# Patient Record
Sex: Female | Born: 1980 | Race: White | Hispanic: No | Smoking: Former smoker
Health system: Southern US, Community
[De-identification: ages and names within clinical notes are randomized; demographics above are authoritative.]

## PROBLEM LIST (undated history)

## (undated) DIAGNOSIS — Z331 Pregnant state, incidental: Secondary | ICD-10-CM

## (undated) DIAGNOSIS — T8859XA Other complications of anesthesia, initial encounter: Secondary | ICD-10-CM

## (undated) DIAGNOSIS — K219 Gastro-esophageal reflux disease without esophagitis: Secondary | ICD-10-CM

## (undated) DIAGNOSIS — T4145XA Adverse effect of unspecified anesthetic, initial encounter: Secondary | ICD-10-CM

## (undated) HISTORY — PX: NASAL SINUS SURGERY: SHX719

---
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
## 2001-12-09 HISTORY — PX: OTHER SURGICAL HISTORY: SHX169

## 2011-09-11 ENCOUNTER — Encounter: Payer: Self-pay | Admitting: *Deleted

## 2011-09-11 ENCOUNTER — Emergency Department (HOSPITAL_COMMUNITY)
Admission: EM | Admit: 2011-09-11 | Discharge: 2011-09-12 | Disposition: A | Discharge status: Home / Self Care | Payer: Self-pay | Attending: Emergency Medicine | Admitting: Emergency Medicine

## 2011-09-11 DIAGNOSIS — K219 Gastro-esophageal reflux disease without esophagitis: Secondary | ICD-10-CM | POA: Insufficient documentation

## 2011-09-11 DIAGNOSIS — Z79899 Other long term (current) drug therapy: Secondary | ICD-10-CM | POA: Insufficient documentation

## 2011-09-11 DIAGNOSIS — R0602 Shortness of breath: Secondary | ICD-10-CM | POA: Insufficient documentation

## 2011-09-11 DIAGNOSIS — F17200 Nicotine dependence, unspecified, uncomplicated: Secondary | ICD-10-CM | POA: Insufficient documentation

## 2011-09-11 DIAGNOSIS — R059 Cough, unspecified: Secondary | ICD-10-CM | POA: Insufficient documentation

## 2011-09-11 DIAGNOSIS — R05 Cough: Secondary | ICD-10-CM | POA: Insufficient documentation

## 2011-09-11 DIAGNOSIS — J069 Acute upper respiratory infection, unspecified: Secondary | ICD-10-CM | POA: Insufficient documentation

## 2011-09-11 DIAGNOSIS — J45901 Unspecified asthma with (acute) exacerbation: Secondary | ICD-10-CM | POA: Insufficient documentation

## 2011-09-11 NOTE — ED Notes (Signed)
Asthma, last neb tx at 2300

## 2011-09-12 MED ORDER — PREDNISONE 20 MG PO TABS
40.0000 mg | ORAL_TABLET | Freq: Once | ORAL | Status: AC
  Administered 2011-09-12: 40 mg via ORAL
  Filled 2011-09-12: qty 2

## 2011-09-12 MED ORDER — PREDNISONE 20 MG PO TABS: 20.0000 mg | ORAL_TABLET | Freq: Every day | ORAL | Status: AC

## 2011-09-12 MED ORDER — ALBUTEROL SULFATE (5 MG/ML) 0.5% IN NEBU
5.0000 mg | INHALATION_SOLUTION | Freq: Once | RESPIRATORY_TRACT | Status: AC
Start: 2011-09-12 — End: 2011-09-12
  Administered 2011-09-12: 5 mg via RESPIRATORY_TRACT
  Filled 2011-09-12: qty 1

## 2011-09-12 MED ORDER — AZITHROMYCIN 250 MG PO TABS: 250.0000 mg | ORAL_TABLET | Freq: Every day | ORAL | Status: AC

## 2011-09-12 MED ORDER — IPRATROPIUM BROMIDE 0.02 % IN SOLN
0.5000 mg | Freq: Once | RESPIRATORY_TRACT | Status: AC
  Administered 2011-09-12: 0.5 mg via RESPIRATORY_TRACT
  Filled 2011-09-12: qty 2.5

## 2011-09-12 MED ORDER — ALBUTEROL SULFATE HFA 108 (90 BASE) MCG/ACT IN AERS: 1.0000 | INHALATION_SPRAY | Freq: Four times a day (QID) | RESPIRATORY_TRACT | Status: DC | PRN

## 2011-09-12 MED ORDER — AZITHROMYCIN 250 MG PO TABS
500.0000 mg | ORAL_TABLET | Freq: Once | ORAL | Status: AC
  Administered 2011-09-12: 500 mg via ORAL
  Filled 2011-09-12: qty 2

## 2011-09-12 NOTE — ED Provider Notes (Signed)
History     CSN: [id number] Arrival date & time: 09/11/2011 11:41 PM  Chief Complaint  Patient presents with  . Asthma    (Consider location/radiation/quality/duration/timing/severity/associated sxs/prior treatment) HPI Comments: Pt with hx of ASThma and GERD - X 2 weeks of gradual onset difficulty breathing, went to [place] several days ago, picked up another cold and felt worsening SOB.  Has had associated wheezing and coughing with productive phlegm.  Used Albuterol neb prior to arrival with some improvement.  Has been on prednisone 2 years ago.  Was hospitalized at that time, no hx of ICu / vent.    Patient is a 30 y.o. female presenting with asthma. The history is provided by the patient.  Asthma Associated symptoms include shortness of breath. Pertinent negatives include no abdominal pain and no headaches.    Past Medical History  Diagnosis Date  . Asthma     Past Surgical History  Procedure Date  . Cholecystectomy   . Nasal sinus surgery     History reviewed. No pertinent family history.  History  Substance Use Topics  . Smoking status: Current Everyday Smoker    Types: Cigarettes  . Smokeless tobacco: Not on file  . Alcohol Use: Yes     occ. use    OB History    Grav Para Term Preterm Abortions TAB SAB Ect Mult Living                  Review of Systems  Constitutional: Negative for fever and chills.  HENT: Positive for congestion and sinus pressure. Negative for neck pain.   Eyes: Negative for pain and visual disturbance.  Respiratory: Positive for cough, chest tightness, shortness of breath and wheezing.   Cardiovascular: Negative for leg swelling.  Gastrointestinal: Negative for nausea, vomiting and abdominal pain.  Genitourinary: Negative for dysuria.  Musculoskeletal: Negative for back pain.  Skin: Negative for rash.  Neurological: Negative for headaches.  Hematological: Negative for adenopathy. Does not bruise/bleed easily.    Allergies  Review of  patient's allergies indicates no known allergies.  Home Medications   Current Outpatient Rx  Name Route Sig Dispense Refill  . ALBUTEROL SULFATE HFA 108 (90 BASE) MCG/ACT IN AERS Inhalation Inhale 2 puffs into the lungs every 6 (six) hours as needed.      Marland Kitchen FLUTICASONE-SALMETEROL 500-50 MCG/DOSE IN AEPB Inhalation Inhale 1 puff into the lungs every 12 (twelve) hours.      . IPRATROPIUM-ALBUTEROL 0.5-2.5 (3) MG/3ML IN SOLN Nebulization Take 3 mLs by nebulization.      Marland Kitchen LEVOCETIRIZINE DIHYDROCHLORIDE 5 MG PO TABS Oral Take 5 mg by mouth every evening.        BP 122/79  Pulse 96  Temp(Src) 97.8 F (36.6 C) (Oral)  Resp 20  Ht 5\' 7"  (1.702 m)  Wt 175 lb (79.379 kg)  BMI 27.41 kg/m2  SpO2 97%  LMP 09/04/2011  Physical Exam  Nursing note and vitals reviewed. Constitutional: She appears well-developed and well-nourished. No distress.  HENT:  Head: Normocephalic and atraumatic.  Right Ear: External ear normal.  Left Ear: External ear normal.  Mouth/Throat: Oropharynx is clear and moist. No oropharyngeal exudate.       TM's clear bilaterally, OP clear, nares clear  Eyes: Conjunctivae and EOM are normal. Pupils are equal, round, and reactive to light. Right eye exhibits no discharge. Left eye exhibits no discharge. No scleral icterus.  Neck: Normal range of motion. Neck supple. No JVD present. No thyromegaly present.  Cardiovascular: Normal  rate, regular rhythm, normal heart sounds and intact distal pulses.  Exam reveals no gallop and no friction rub.   No murmur heard. Pulmonary/Chest: Effort normal. No respiratory distress. She has wheezes. She has no rales.       Speaks in full sentences, has diffuse mild expiratory wheezing bialterlaly.  Abdominal: Soft. Bowel sounds are normal. She exhibits no distension and no mass. There is no tenderness.  Musculoskeletal: Normal range of motion. She exhibits no edema and no tenderness.  Lymphadenopathy:    She has no cervical adenopathy.    Neurological: She is alert. Coordination normal.  Skin: Skin is warm and dry. No rash noted. No erythema.  Psychiatric: She has a normal mood and affect. Her behavior is normal.    ED Course  Procedures (including critical care time)  Labs Reviewed - No data to display No results found.   No diagnosis found.    MDM  Overall pt appears to have likely URI with asthma attack - sat's 97% on ra, no tachycardia, fever or hypotension.  Will give albuterol - atrovent, prednisone, zithromax.  Close f/u.    Pt reevaqluated - on reexam  Has improved wheezing, sat's remain stable - will d/ chome.  Pt aware of indications for f/u.  Vida Roller, MD 09/12/11 [phone]

## 2011-09-12 NOTE — ED Notes (Addendum)
Pt reports history of asthma and reports that change in weather and recent URI has cause increase in asthma symptoms.  Reports she did do nebulizer treatments at home with no relief.  Inspiratory and expiratory wheezes noted. Pt denies fever at this time.

## 2011-11-21 ENCOUNTER — Emergency Department (HOSPITAL_COMMUNITY)
Admission: EM | Admit: 2011-11-21 | Discharge: 2011-11-21 | Disposition: A | Discharge status: Home / Self Care | Payer: Self-pay | Attending: Emergency Medicine | Admitting: Emergency Medicine

## 2011-11-21 ENCOUNTER — Emergency Department (HOSPITAL_COMMUNITY): Payer: Self-pay

## 2011-11-21 ENCOUNTER — Encounter (HOSPITAL_COMMUNITY): Payer: Self-pay | Admitting: Emergency Medicine

## 2011-11-21 DIAGNOSIS — R05 Cough: Secondary | ICD-10-CM | POA: Insufficient documentation

## 2011-11-21 DIAGNOSIS — Z79899 Other long term (current) drug therapy: Secondary | ICD-10-CM | POA: Insufficient documentation

## 2011-11-21 DIAGNOSIS — R0989 Other specified symptoms and signs involving the circulatory and respiratory systems: Secondary | ICD-10-CM | POA: Insufficient documentation

## 2011-11-21 DIAGNOSIS — R0609 Other forms of dyspnea: Secondary | ICD-10-CM | POA: Insufficient documentation

## 2011-11-21 DIAGNOSIS — O9989 Other specified diseases and conditions complicating pregnancy, childbirth and the puerperium: Secondary | ICD-10-CM | POA: Insufficient documentation

## 2011-11-21 DIAGNOSIS — J45909 Unspecified asthma, uncomplicated: Secondary | ICD-10-CM | POA: Insufficient documentation

## 2011-11-21 DIAGNOSIS — R059 Cough, unspecified: Secondary | ICD-10-CM | POA: Insufficient documentation

## 2011-11-21 MED ORDER — ALBUTEROL SULFATE (5 MG/ML) 0.5% IN NEBU
5.0000 mg | INHALATION_SOLUTION | Freq: Once | RESPIRATORY_TRACT | Status: AC
  Administered 2011-11-21: 5 mg via RESPIRATORY_TRACT
  Filled 2011-11-21: qty 1

## 2011-11-21 MED ORDER — IPRATROPIUM BROMIDE 0.02 % IN SOLN
0.5000 mg | Freq: Once | RESPIRATORY_TRACT | Status: AC
  Administered 2011-11-21: 0.5 mg via RESPIRATORY_TRACT
  Filled 2011-11-21: qty 2.5

## 2011-11-21 MED ORDER — IPRATROPIUM-ALBUTEROL 0.5-2.5 (3) MG/3ML IN SOLN: 3.0000 mL | Freq: Two times a day (BID) | RESPIRATORY_TRACT | Status: DC

## 2011-11-21 MED ORDER — PREDNISONE 20 MG PO TABS
60.0000 mg | ORAL_TABLET | Freq: Once | ORAL | Status: AC
  Administered 2011-11-21: 60 mg via ORAL
  Filled 2011-11-21: qty 3

## 2011-11-21 MED ORDER — ALBUTEROL SULFATE (2.5 MG/3ML) 0.083% IN NEBU: 2.5000 mg | INHALATION_SOLUTION | Freq: Four times a day (QID) | RESPIRATORY_TRACT | Status: DC | PRN

## 2011-11-21 MED ORDER — ALBUTEROL SULFATE HFA 108 (90 BASE) MCG/ACT IN AERS: 1.0000 | INHALATION_SPRAY | Freq: Four times a day (QID) | RESPIRATORY_TRACT | Status: DC | PRN

## 2011-11-21 MED ORDER — PREDNISONE 50 MG PO TABS: 50.0000 mg | ORAL_TABLET | Freq: Every day | ORAL | Status: DC

## 2011-11-21 NOTE — ED Notes (Signed)
Pt stable at discharge no distress noted

## 2011-11-21 NOTE — ED Notes (Signed)
Patient c/o cough x 3-4 days; states has exacerbated her asthma. States nebulizer is not working at home.

## 2011-11-21 NOTE — ED Provider Notes (Signed)
History  Scribed for Donnetta Hutching, MD, the patient was seen in room APA05/APA05. This chart was scribed by Candelaria Stagers. The patient's care started at 9:57 PM.    CSN: [id number] Arrival date & time: 11/21/2011  9:36 PM   First MD Initiated Contact with Patient 11/21/11 2141      Chief Complaint  Patient presents with  . Cough  . Asthma     HPI Katrina Mitchell is a 30 y.o. female who presents to the Emergency Department complaining of difficulty breathing associated with asthma complications that started about five to six days.  She is experiencing a cough.  Patient has used nebulizer with short term relief.  She reports that she has ran out of albuterol inhaler as well as nebullizer treatments.  Pt states she is 5 1/[redacted] weeks pregnant and will see her OB on the 28th.       Past Medical History  Diagnosis Date  . Asthma     Past Surgical History  Procedure Date  . Cholecystectomy   . Nasal sinus surgery     No family history on file.  History  Substance Use Topics  . Smoking status: Former Smoker    Types: Cigarettes  . Smokeless tobacco: Not on file  . Alcohol Use: Yes     occ. use    OB History    Grav Para Term Preterm Abortions TAB SAB Ect Mult Living   1               Review of Systems  Allergies  Review of patient's allergies indicates no known allergies.  Home Medications   Current Outpatient Rx  Name Route Sig Dispense Refill  . ALBUTEROL SULFATE HFA 108 (90 BASE) MCG/ACT IN AERS Inhalation Inhale 2 puffs into the lungs every 6 (six) hours as needed.      . ALBUTEROL SULFATE HFA 108 (90 BASE) MCG/ACT IN AERS Inhalation Inhale 1-2 puffs into the lungs every 6 (six) hours as needed for wheezing. 1 Inhaler 0  . FLUTICASONE-SALMETEROL 500-50 MCG/DOSE IN AEPB Inhalation Inhale 1 puff into the lungs every 12 (twelve) hours.      . IPRATROPIUM-ALBUTEROL 0.5-2.5 (3) MG/3ML IN SOLN Nebulization Take 3 mLs by nebulization.      Marland Kitchen LEVOCETIRIZINE  DIHYDROCHLORIDE 5 MG PO TABS Oral Take 5 mg by mouth every evening.        BP 128/70  Pulse 99  Temp(Src) 98.3 F (36.8 C) (Oral)  Resp 18  Ht 5\' 7"  (1.702 m)  Wt 180 lb (81.647 kg)  BMI 28.19 kg/m2  SpO2 99%  LMP 09/04/2011  Physical Exam  Nursing note and vitals reviewed. Constitutional: She is oriented to person, place, and time. She appears well-developed and well-nourished. No distress.  HENT:  Head: Normocephalic and atraumatic.  Eyes: EOM are normal. Pupils are equal, round, and reactive to light.  Neck: Neck supple. No tracheal deviation present.  Cardiovascular: Normal rate.   Pulmonary/Chest: Effort normal. She has wheezes (bilateral expiratory wheezing).  Abdominal: Soft. She exhibits no distension.  Musculoskeletal: Normal range of motion. She exhibits no edema.  Neurological: She is alert and oriented to person, place, and time. No sensory deficit.  Skin: Skin is warm and dry.  Psychiatric: She has a normal mood and affect. Her behavior is normal.    ED Course  Procedures   Breathing treatment; start on prednisone; refill medications.   DIAGNOSTIC STUDIES: Oxygen Saturation is 99% on room air, normal by my interpretation.  COORDINATION OF CARE:  Ordered:  albuterol (PROVENTIL) (5 MG/ML) 0.5% nebulizer solution 5 mg ; ipratropium (ATROVENT) nebulizer solution 0.5 mg    Labs Reviewed - No data to display No results found.   No diagnosis found.    MDM  Patient has long-standing asthma.  Will avoid chest x-ray secondary to pregnancy. Refill medications. Prednisone prescribed. Benefits exceed risk.    I personally performed the services described in this documentation, which was scribed in my presence. The recorded information has been reviewed and considered.       Donnetta Hutching, MD 11/21/11 [phone]

## 2011-11-27 ENCOUNTER — Encounter (HOSPITAL_COMMUNITY): Payer: Self-pay | Admitting: *Deleted

## 2011-11-27 ENCOUNTER — Emergency Department (HOSPITAL_COMMUNITY)
Admission: EM | Admit: 2011-11-27 | Discharge: 2011-11-27 | Disposition: A | Discharge status: Home / Self Care | Payer: Medicaid Other | Attending: Emergency Medicine | Admitting: Emergency Medicine

## 2011-11-27 DIAGNOSIS — J40 Bronchitis, not specified as acute or chronic: Secondary | ICD-10-CM

## 2011-11-27 DIAGNOSIS — R05 Cough: Secondary | ICD-10-CM | POA: Insufficient documentation

## 2011-11-27 DIAGNOSIS — J45901 Unspecified asthma with (acute) exacerbation: Secondary | ICD-10-CM | POA: Insufficient documentation

## 2011-11-27 DIAGNOSIS — O99891 Other specified diseases and conditions complicating pregnancy: Secondary | ICD-10-CM | POA: Insufficient documentation

## 2011-11-27 DIAGNOSIS — R0602 Shortness of breath: Secondary | ICD-10-CM | POA: Insufficient documentation

## 2011-11-27 DIAGNOSIS — J029 Acute pharyngitis, unspecified: Secondary | ICD-10-CM | POA: Insufficient documentation

## 2011-11-27 DIAGNOSIS — R059 Cough, unspecified: Secondary | ICD-10-CM | POA: Insufficient documentation

## 2011-11-27 DIAGNOSIS — Z331 Pregnant state, incidental: Secondary | ICD-10-CM

## 2011-11-27 HISTORY — DX: Pregnant state, incidental: Z33.1

## 2011-11-27 MED ORDER — ALBUTEROL SULFATE (5 MG/ML) 0.5% IN NEBU
5.0000 mg | INHALATION_SOLUTION | Freq: Once | RESPIRATORY_TRACT | Status: AC
  Administered 2011-11-27: 5 mg via RESPIRATORY_TRACT
  Filled 2011-11-27: qty 1

## 2011-11-27 MED ORDER — AZITHROMYCIN 250 MG PO TABS
500.0000 mg | ORAL_TABLET | Freq: Once | ORAL | Status: AC
  Administered 2011-11-27: 500 mg via ORAL
  Filled 2011-11-27: qty 2

## 2011-11-27 MED ORDER — AZITHROMYCIN 250 MG PO TABS: ORAL_TABLET | ORAL | Status: DC

## 2011-11-27 MED ORDER — DEXAMETHASONE SODIUM PHOSPHATE 4 MG/ML IJ SOLN
10.0000 mg | Freq: Once | INTRAMUSCULAR | Status: AC
  Administered 2011-11-27: 10 mg via INTRAMUSCULAR
  Filled 2011-11-27: qty 3

## 2011-11-27 MED ORDER — CEFTRIAXONE SODIUM 1 G IJ SOLR
1.0000 g | Freq: Once | INTRAMUSCULAR | Status: AC
  Administered 2011-11-27: 1 g via INTRAMUSCULAR
  Filled 2011-11-27: qty 10

## 2011-11-27 MED ORDER — IPRATROPIUM BROMIDE 0.02 % IN SOLN
0.5000 mg | Freq: Once | RESPIRATORY_TRACT | Status: AC
  Administered 2011-11-27: 0.5 mg via RESPIRATORY_TRACT
  Filled 2011-11-27: qty 2.5

## 2011-11-27 NOTE — ED Notes (Signed)
Pt states flare up of asthma x 2 weeks. Seen her and put on steroid, became better for a couple of days but it worse again. Productive cough, yellowish-green in color. NAD at triage.

## 2011-11-27 NOTE — ED Provider Notes (Signed)
History     CSN: [id number] Arrival date & time: 11/27/2011  6:24 PM   First MD Initiated Contact with Patient 11/27/11 1838      Chief Complaint  Patient presents with  . Cough    (Consider location/radiation/quality/duration/timing/severity/associated sxs/prior treatment) HPI Comments: Pt is about [redacted] weeks pregnant.  Initial prenatal appt on 12-06-11.  Patient is a 30 y.o. female presenting with cough. The history is provided by the patient. No language interpreter was used.  Cough This is a new problem. The current episode started more than 1 week ago. The problem occurs constantly. The problem has been gradually worsening. The cough is productive of sputum. Associated symptoms include sore throat and wheezing. She is not a smoker.    Past Medical History  Diagnosis Date  . Asthma   . Pregnancy as incidental finding     Past Surgical History  Procedure Date  . Cholecystectomy   . Nasal sinus surgery     No family history on file.  History  Substance Use Topics  . Smoking status: Former Smoker    Types: Cigarettes  . Smokeless tobacco: Not on file  . Alcohol Use: Yes     occ. use    OB History    Grav Para Term Preterm Abortions TAB SAB Ect Mult Living   1               Review of Systems  Constitutional: Negative for fever.  HENT: Positive for sore throat.   Respiratory: Positive for cough and wheezing.   All other systems reviewed and are negative.    Allergies  Review of patient's allergies indicates no known allergies.  Home Medications   Current Outpatient Rx  Name Route Sig Dispense Refill  . ALBUTEROL SULFATE HFA 108 (90 BASE) MCG/ACT IN AERS Inhalation Inhale 1-2 puffs into the lungs every 6 (six) hours as needed for wheezing. 1 Inhaler 0  . ALBUTEROL SULFATE (2.5 MG/3ML) 0.083% IN NEBU Nebulization Take 3 mLs (2.5 mg total) by nebulization every 6 (six) hours as needed for wheezing. 75 mL 12  . FLUTICASONE-SALMETEROL 500-50 MCG/DOSE IN AEPB  Inhalation Inhale 1 puff into the lungs every 12 (twelve) hours.      . IPRATROPIUM-ALBUTEROL 0.5-2.5 (3) MG/3ML IN SOLN Nebulization Take 3 mLs by nebulization 2 (two) times daily. 75 mL 0  . PREDNISONE 50 MG PO TABS Oral Take 50 mg by mouth daily.      Marland Kitchen PRENATAL MULTIVITAMIN CH Oral Take 1 tablet by mouth at bedtime.        BP 128/70  Pulse 102  Temp(Src) 98.7 F (37.1 C) (Oral)  Resp 18  Ht 5\' 7"  (1.702 m)  Wt 180 lb (81.647 kg)  BMI 28.19 kg/m2  SpO2 100%  LMP 09/04/2011  Physical Exam  Nursing note and vitals reviewed. Constitutional: She is oriented to person, place, and time. She appears well-developed and well-nourished. No distress.  HENT:  Head: Normocephalic and atraumatic.  Eyes: EOM are normal.  Neck: Normal range of motion.  Cardiovascular: Normal rate, regular rhythm and normal heart sounds.   Pulmonary/Chest: Effort normal. No accessory muscle usage. Not tachypneic. No respiratory distress. She has no decreased breath sounds. She has wheezes in the right upper field, the right middle field, the right lower field, the left upper field, the left middle field and the left lower field. She has no rhonchi. She has no rales.  Abdominal: Soft. She exhibits no distension. There is no tenderness.  Musculoskeletal: Normal range of motion.  Neurological: She is alert and oriented to person, place, and time.  Skin: Skin is warm and dry.  Psychiatric: She has a normal mood and affect. Judgment normal.    ED Course  Procedures (including critical care time)  Labs Reviewed - No data to display No results found.   No diagnosis found.    MDM          Worthy Rancher, PA 11/27/11 2032

## 2011-11-27 NOTE — ED Notes (Signed)
HHN in progress 

## 2011-11-27 NOTE — ED Notes (Signed)
Cough,,seen here for same. Says she is taking nebs at home.  ,  But feeling worse.

## 2011-11-29 NOTE — ED Provider Notes (Signed)
Medical screening examination/treatment/procedure(s) were performed by non-physician practitioner and as supervising physician I was immediately available for consultation/collaboration.  Nicoletta Dress. Colon Branch, MD 11/29/11 [phone]

## 2011-12-06 LAB — OB RESULTS CONSOLE HIV ANTIBODY (ROUTINE TESTING): HIV: NONREACTIVE

## 2011-12-06 LAB — OB RESULTS CONSOLE RPR: RPR: NONREACTIVE

## 2011-12-06 LAB — OB RESULTS CONSOLE GC/CHLAMYDIA
Chlamydia: NEGATIVE
Gonorrhea: NEGATIVE

## 2011-12-10 NOTE — L&D Delivery Note (Signed)
Delivery Note  Damyiah is a G3P2002 at 35 weeks and 3 days who came in yesterday with spontaneous rupture of membranes.  She was induced with Pitocin.  At 4:03 AM a viable and healthy female was delivered via Vaginal, Spontaneous Delivery (Presentation: Right Occiput Anterior).  APGAR: 8, 9; weight 5 lb 12.4 oz (2620 g).   Placenta status: Intact, Spontaneous. 2 hard, peripheral calcifications. Sent for pathology.  Cord: 3 vessels.  Anesthesia: Epidural  Episiotomy: None Lacerations: R periurethral abrasion, L vaginal wall abrasion; good hemostasis Suture Repair: None Est. Blood Loss (mL): 350  Mom to postpartum.  Baby to nursery-stable.  Delivery was attended by Sid Falcon, CNM and Dr. Katrinka Blazing and RT of NICU    Junious Silk S 06/29/2012, 4:35 AM   I was present for delivery and agree with note above. [facility]

## 2012-01-13 ENCOUNTER — Other Ambulatory Visit: Payer: Self-pay | Admitting: Obstetrics and Gynecology

## 2012-02-25 ENCOUNTER — Other Ambulatory Visit: Payer: Self-pay | Admitting: Obstetrics & Gynecology

## 2012-03-17 ENCOUNTER — Other Ambulatory Visit: Payer: Self-pay | Admitting: Obstetrics and Gynecology

## 2012-06-28 ENCOUNTER — Inpatient Hospital Stay (HOSPITAL_COMMUNITY)
Admission: AD | Admit: 2012-06-28 | Discharge: 2012-07-01 | DRG: 775 | Disposition: A | Discharge status: Home / Self Care | Payer: Medicaid Other | Source: Ambulatory Visit | Attending: Obstetrics and Gynecology | Admitting: Obstetrics and Gynecology

## 2012-06-28 ENCOUNTER — Encounter (HOSPITAL_COMMUNITY): Payer: Self-pay | Admitting: *Deleted

## 2012-06-28 DIAGNOSIS — O4290 Premature rupture of membranes, unspecified as to length of time between rupture and onset of labor, unspecified weeks of gestation: Principal | ICD-10-CM | POA: Diagnosis present

## 2012-06-28 HISTORY — DX: Other complications of anesthesia, initial encounter: T88.59XA

## 2012-06-28 HISTORY — DX: Adverse effect of unspecified anesthetic, initial encounter: T41.45XA

## 2012-06-28 LAB — CBC
Hemoglobin: 11.6 g/dL — ABNORMAL LOW (ref 12.0–15.0)
MCH: 27.8 pg (ref 26.0–34.0)
MCV: 81.5 fL (ref 78.0–100.0)
Platelets: 209 10*3/uL (ref 150–400)
RBC: 4.17 MIL/uL (ref 3.87–5.11)
WBC: 9.7 10*3/uL (ref 4.0–10.5)

## 2012-06-28 MED ORDER — OXYTOCIN 40 UNITS IN LACTATED RINGERS INFUSION - SIMPLE MED
62.5000 mL/h | Freq: Once | INTRAVENOUS | Status: DC
  Filled 2012-06-28: qty 1000

## 2012-06-28 MED ORDER — LIDOCAINE HCL (PF) 1 % IJ SOLN
30.0000 mL | INTRAMUSCULAR | Status: DC | PRN
  Filled 2012-06-28: qty 30

## 2012-06-28 MED ORDER — PENICILLIN G POTASSIUM 5000000 UNITS IJ SOLR
5.0000 10*6.[IU] | Freq: Once | INTRAVENOUS | Status: AC
  Administered 2012-06-28: 5 10*6.[IU] via INTRAVENOUS
  Filled 2012-06-28: qty 5

## 2012-06-28 MED ORDER — CITRIC ACID-SODIUM CITRATE 334-500 MG/5ML PO SOLN: 30.0000 mL | ORAL | Status: DC | PRN

## 2012-06-28 MED ORDER — ONDANSETRON HCL 4 MG/2ML IJ SOLN: 4.0000 mg | Freq: Four times a day (QID) | INTRAMUSCULAR | Status: DC | PRN

## 2012-06-28 MED ORDER — OXYCODONE-ACETAMINOPHEN 5-325 MG PO TABS: 1.0000 | ORAL_TABLET | ORAL | Status: DC | PRN

## 2012-06-28 MED ORDER — ALBUTEROL SULFATE HFA 108 (90 BASE) MCG/ACT IN AERS
2.0000 | INHALATION_SPRAY | RESPIRATORY_TRACT | Status: DC | PRN
  Filled 2012-06-28: qty 6.7

## 2012-06-28 MED ORDER — PENICILLIN G POTASSIUM 5000000 UNITS IJ SOLR
2.5000 10*6.[IU] | INTRAVENOUS | Status: DC
  Administered 2012-06-28 – 2012-06-29 (×2): 2.5 10*6.[IU] via INTRAVENOUS
  Filled 2012-06-28 (×7): qty 2.5

## 2012-06-28 MED ORDER — IBUPROFEN 600 MG PO TABS: 600.0000 mg | ORAL_TABLET | Freq: Four times a day (QID) | ORAL | Status: DC | PRN

## 2012-06-28 MED ORDER — FLEET ENEMA 7-19 GM/118ML RE ENEM: 1.0000 | ENEMA | RECTAL | Status: DC | PRN

## 2012-06-28 MED ORDER — ACETAMINOPHEN 325 MG PO TABS: 650.0000 mg | ORAL_TABLET | ORAL | Status: DC | PRN

## 2012-06-28 MED ORDER — LACTATED RINGERS IV SOLN
500.0000 mL | INTRAVENOUS | Status: DC | PRN
  Administered 2012-06-29: 500 mL via INTRAVENOUS

## 2012-06-28 MED ORDER — LACTATED RINGERS IV SOLN
INTRAVENOUS | Status: DC
  Administered 2012-06-28 – 2012-06-29 (×3): via INTRAVENOUS

## 2012-06-28 MED ORDER — OXYTOCIN BOLUS FROM INFUSION
250.0000 mL | Freq: Once | INTRAVENOUS | Status: AC
  Administered 2012-06-29: 250 mL via INTRAVENOUS
  Filled 2012-06-28: qty 500

## 2012-06-28 NOTE — H&P (Signed)
I agree with above plans for care. GBS unknown, on PCN prophylaxis

## 2012-06-28 NOTE — H&P (Signed)
Katrina Mitchell is a 31 y.o. female presenting for Preterm Premature Rupture of Membranes @ 05:30;  Pt at [facility]. Maternal Medical History:  Reason for admission: Reason for admission: rupture of membranes.  Contractions: Onset was 3-5 hours ago.   Frequency: irregular.   Perceived severity is mild.    Fetal activity: Perceived fetal activity is normal.   Last perceived fetal movement was within the past hour.    Prenatal complications: PPROM    OB History    Grav Para Term Preterm Abortions TAB SAB Ect Mult Living   3 2 2  0 0 0 0 0 0 2     Past Medical History  Diagnosis Date  . Asthma   . Pregnancy as incidental finding   . Complication of anesthesia    Past Surgical History  Procedure Date  . Cholecystectomy   . Nasal sinus surgery    Family History: family history is not on file. Social History:  reports that she has quit smoking. Her smoking use included Cigarettes. She has never used smokeless tobacco. She reports that she drinks alcohol. She reports that she uses illicit drugs (Marijuana).   Prenatal Transfer Tool  Maternal Diabetes: No Genetic Screening: Normal Maternal Ultrasounds/Referrals: Abnormal:  Findings:   Isolated EIF (echogenic intracardiac focus) Fetal Ultrasounds or other Referrals:  None Maternal Substance Abuse:  Yes:  Type: Marijuana Significant Maternal Medications:  Meds include: Other: see prenatal record Significant Maternal Lab Results:  Lab values include: Other: see prenatal record Other Comments:  HSV II; on valtrex  Review of Systems  Gastrointestinal: Positive for abdominal pain (cramping).  All other systems reviewed and are negative.    Dilation: 1.5 Effacement (%): 50 Station: -1;0 Exam by:: J. Rasch RN  Blood pressure 134/67, pulse 86, temperature 98.4 F (36.9 C), temperature source Oral, resp. rate 18, height 5\' 6"  (1.676 m), weight 92.534 kg (204 lb), last menstrual period 10/25/2011. Maternal Exam:  Uterine  Assessment: Contraction strength is mild.  Contraction frequency is irregular.   Abdomen: Estimated fetal weight is 4.5-5lbs.   Fetal presentation: vertex  Introitus: Vulva is positive for vulvar varicosities. Vulva is negative for lesion.  Vagina is positive for vaginal discharge (clear amniontic fluid ).  Vagina is negative for ulcerations.  Amniotic fluid character: clear.  Pelvis: adequate for delivery.      Fetal Exam Fetal Monitor Review: Baseline rate: 130's.  Variability: marked (>25 bpm).   Pattern: accelerations present and no decelerations.    Fetal State Assessment: Category I - tracings are normal.     Physical Exam  Constitutional: She is oriented to person, place, and time. She appears well-developed and well-nourished. No distress.  HENT:  Head: Normocephalic.  Neck: Normal range of motion. Neck supple.  Cardiovascular: Normal rate, regular rhythm and normal heart sounds.   Respiratory: Effort normal and breath sounds normal.  GI: Soft. There is no tenderness.  Genitourinary: Vulva exhibits no lesion. No bleeding around the vagina. Vaginal discharge (clear amniontic fluid ) found.  Neurological: She is alert and oriented to person, place, and time.  Skin: Skin is warm and dry.    Prenatal labs: ABO, Rh: O/Positive/-- (12/28 0000) Antibody: Negative (12/28 0000) Rubella: Immune (12/28 0000) RPR: Nonreactive (12/28 0000)  HBsAg: Negative (12/28 0000)  HIV: Non-reactive (12/28 0000)  GBS:     Assessment/Plan: Preterm Premature Rupture of Membranes GBS unknown  Plan: Admit to [facility] Foley bulb placed Begin Penicillin prophylaxis Anticipate NSVD    [facility]  06/28/2012, 11:22 AM

## 2012-06-28 NOTE — Progress Notes (Signed)
   Subjective: Pt reports increased pain with contractions.  Objective: BP 113/58  Pulse 91  Temp 98.4 F (36.9 C) (Oral)  Resp 18  Ht 5\' 6"  (1.676 m)  Wt 92.534 kg (204 lb)  BMI 32.93 kg/m2  LMP 10/25/2011      FHT:  FHR: 130's bpm, variability: moderate,  accelerations:  Present,  decelerations:  Absent UC:   irregular, every 2-5 minutes SVE:   Dilation: 1.5 Effacement (%): 50 Station: -2 Exam by:: [employer], rn Foley bulb remains in place with tugging  Labs: Lab Results  Component Value Date   WBC 9.7 06/28/2012   HGB 11.6* 06/28/2012   HCT 34.0* 06/28/2012   MCV 81.5 06/28/2012   PLT 209 06/28/2012    Assessment / Plan: Augmentation of Labor - PPROM GBS Unknown  Labor: Augmentation of Labor - PPROM Preeclampsia:  n/a Fetal Wellbeing:  Category I Pain Control:  Labor support without medications I/D:  n/a Anticipated MOD:  NSVD  [facility] 06/28/2012, 3:24 PM

## 2012-06-29 ENCOUNTER — Encounter (HOSPITAL_COMMUNITY): Payer: Self-pay | Admitting: Anesthesiology

## 2012-06-29 ENCOUNTER — Inpatient Hospital Stay (HOSPITAL_COMMUNITY): Payer: Medicaid Other | Admitting: Anesthesiology

## 2012-06-29 ENCOUNTER — Encounter (HOSPITAL_COMMUNITY): Payer: Self-pay | Admitting: Student

## 2012-06-29 DIAGNOSIS — O4290 Premature rupture of membranes, unspecified as to length of time between rupture and onset of labor, unspecified weeks of gestation: Secondary | ICD-10-CM

## 2012-06-29 MED ORDER — ZOLPIDEM TARTRATE 5 MG PO TABS: 5.0000 mg | ORAL_TABLET | Freq: Every evening | ORAL | Status: DC | PRN

## 2012-06-29 MED ORDER — LANOLIN HYDROUS EX OINT: TOPICAL_OINTMENT | CUTANEOUS | Status: DC | PRN

## 2012-06-29 MED ORDER — PRENATAL MULTIVITAMIN CH
1.0000 | ORAL_TABLET | Freq: Every day | ORAL | Status: DC
  Administered 2012-06-29 – 2012-07-01 (×3): 1 via ORAL
  Filled 2012-06-29 (×4): qty 1

## 2012-06-29 MED ORDER — ONDANSETRON HCL 4 MG/2ML IJ SOLN: 4.0000 mg | INTRAMUSCULAR | Status: DC | PRN

## 2012-06-29 MED ORDER — LACTATED RINGERS IV SOLN
500.0000 mL | Freq: Once | INTRAVENOUS | Status: AC
  Administered 2012-06-29: 500 mL via INTRAVENOUS

## 2012-06-29 MED ORDER — PHENYLEPHRINE 40 MCG/ML (10ML) SYRINGE FOR IV PUSH (FOR BLOOD PRESSURE SUPPORT)
80.0000 ug | PREFILLED_SYRINGE | INTRAVENOUS | Status: DC | PRN
  Filled 2012-06-29: qty 5

## 2012-06-29 MED ORDER — LIDOCAINE HCL (PF) 1 % IJ SOLN
INTRAMUSCULAR | Status: DC | PRN
  Administered 2012-06-29 (×2): 4 mL

## 2012-06-29 MED ORDER — PANTOPRAZOLE SODIUM 20 MG PO TBEC
20.0000 mg | DELAYED_RELEASE_TABLET | Freq: Every day | ORAL | Status: DC | PRN
  Administered 2012-07-01: 20 mg via ORAL
  Filled 2012-06-29: qty 1

## 2012-06-29 MED ORDER — FENTANYL 2.5 MCG/ML BUPIVACAINE 1/10 % EPIDURAL INFUSION (WH - ANES)
14.0000 mL/h | INTRAMUSCULAR | Status: DC
  Filled 2012-06-29: qty 60

## 2012-06-29 MED ORDER — DIBUCAINE 1 % RE OINT: 1.0000 "application " | TOPICAL_OINTMENT | RECTAL | Status: DC | PRN

## 2012-06-29 MED ORDER — OXYTOCIN 40 UNITS IN LACTATED RINGERS INFUSION - SIMPLE MED: 1.0000 m[IU]/min | INTRAVENOUS | Status: DC

## 2012-06-29 MED ORDER — ALBUTEROL SULFATE HFA 108 (90 BASE) MCG/ACT IN AERS
1.0000 | INHALATION_SPRAY | Freq: Four times a day (QID) | RESPIRATORY_TRACT | Status: DC | PRN
  Administered 2012-07-01: 2 via RESPIRATORY_TRACT
  Filled 2012-06-29: qty 6.7

## 2012-06-29 MED ORDER — ONDANSETRON HCL 4 MG PO TABS: 4.0000 mg | ORAL_TABLET | ORAL | Status: DC | PRN

## 2012-06-29 MED ORDER — SENNOSIDES-DOCUSATE SODIUM 8.6-50 MG PO TABS: 2.0000 | ORAL_TABLET | Freq: Every day | ORAL | Status: DC

## 2012-06-29 MED ORDER — DIPHENHYDRAMINE HCL 25 MG PO CAPS: 25.0000 mg | ORAL_CAPSULE | Freq: Four times a day (QID) | ORAL | Status: DC | PRN

## 2012-06-29 MED ORDER — PHENYLEPHRINE 40 MCG/ML (10ML) SYRINGE FOR IV PUSH (FOR BLOOD PRESSURE SUPPORT): 80.0000 ug | PREFILLED_SYRINGE | INTRAVENOUS | Status: DC | PRN

## 2012-06-29 MED ORDER — DIPHENHYDRAMINE HCL 50 MG/ML IJ SOLN: 12.5000 mg | INTRAMUSCULAR | Status: DC | PRN

## 2012-06-29 MED ORDER — TETANUS-DIPHTH-ACELL PERTUSSIS 5-2.5-18.5 LF-MCG/0.5 IM SUSP
0.5000 mL | Freq: Once | INTRAMUSCULAR | Status: AC
  Administered 2012-06-30: 0.5 mL via INTRAMUSCULAR

## 2012-06-29 MED ORDER — OXYCODONE-ACETAMINOPHEN 5-325 MG PO TABS: 1.0000 | ORAL_TABLET | ORAL | Status: DC | PRN

## 2012-06-29 MED ORDER — IBUPROFEN 600 MG PO TABS
600.0000 mg | ORAL_TABLET | Freq: Four times a day (QID) | ORAL | Status: DC
  Administered 2012-06-29 – 2012-07-01 (×10): 600 mg via ORAL
  Filled 2012-06-29 (×10): qty 1

## 2012-06-29 MED ORDER — FENTANYL 2.5 MCG/ML BUPIVACAINE 1/10 % EPIDURAL INFUSION (WH - ANES)
INTRAMUSCULAR | Status: DC | PRN
  Administered 2012-06-29: 14 mL/h via EPIDURAL

## 2012-06-29 MED ORDER — LORATADINE 10 MG PO TABS
10.0000 mg | ORAL_TABLET | Freq: Every day | ORAL | Status: DC
Start: 2012-06-29 — End: 2012-07-01
  Administered 2012-06-29 – 2012-07-01 (×3): 10 mg via ORAL
  Filled 2012-06-29 (×4): qty 1

## 2012-06-29 MED ORDER — BENZOCAINE-MENTHOL 20-0.5 % EX AERO
1.0000 "application " | INHALATION_SPRAY | CUTANEOUS | Status: DC | PRN
  Administered 2012-06-29: 1 via TOPICAL
  Filled 2012-06-29: qty 56

## 2012-06-29 MED ORDER — TERBUTALINE SULFATE 1 MG/ML IJ SOLN: 0.2500 mg | Freq: Once | INTRAMUSCULAR | Status: DC | PRN

## 2012-06-29 MED ORDER — EPHEDRINE 5 MG/ML INJ: 10.0000 mg | INTRAVENOUS | Status: DC | PRN

## 2012-06-29 MED ORDER — ALBUTEROL SULFATE (5 MG/ML) 0.5% IN NEBU
2.5000 mg | INHALATION_SOLUTION | RESPIRATORY_TRACT | Status: DC | PRN
  Filled 2012-06-29: qty 0.5

## 2012-06-29 MED ORDER — WITCH HAZEL-GLYCERIN EX PADS: 1.0000 "application " | MEDICATED_PAD | CUTANEOUS | Status: DC | PRN

## 2012-06-29 MED ORDER — LACTATED RINGERS IV SOLN
INTRAVENOUS | Status: DC
  Administered 2012-06-29: 04:00:00 via INTRAUTERINE

## 2012-06-29 MED ORDER — SIMETHICONE 80 MG PO CHEW: 80.0000 mg | CHEWABLE_TABLET | ORAL | Status: DC | PRN

## 2012-06-29 MED ORDER — EPHEDRINE 5 MG/ML INJ
10.0000 mg | INTRAVENOUS | Status: DC | PRN
  Filled 2012-06-29: qty 4

## 2012-06-29 NOTE — Anesthesia Preprocedure Evaluation (Signed)

## 2012-06-29 NOTE — Anesthesia Postprocedure Evaluation (Signed)
  Anesthesia Post-op Note  Patient: Katrina Mitchell  Procedure(s) Performed: * No procedures listed *  Patient Location: Mother/Baby  Anesthesia Type: Epidural  Level of Consciousness: awake  Airway and Oxygen Therapy: Patient Spontanous Breathing  Post-op Pain: mild  Post-op Assessment: Patient's Cardiovascular Status Stable and Respiratory Function Stable  Post-op Vital Signs: stable  Complications: No apparent anesthesia complications

## 2012-06-29 NOTE — Anesthesia Procedure Notes (Signed)
Epidural Patient location during procedure: OB Start time: 06/29/2012 3:20 AM  Staffing Anesthesiologist: Adhrit Krenz A. Performed by: anesthesiologist   Preanesthetic Checklist Completed: patient identified, site marked, surgical consent, pre-op evaluation, timeout performed, IV checked, risks and benefits discussed and monitors and equipment checked  Epidural Patient position: sitting Prep: site prepped and draped and DuraPrep Patient monitoring: continuous pulse ox and blood pressure Approach: midline Injection technique: LOR air  Needle:  Needle type: Tuohy  Needle gauge: 17 G Needle length: 9 cm Needle insertion depth: 5 cm cm Catheter type: closed end flexible Catheter size: 19 Gauge Catheter at skin depth: 10 cm Test dose: negative and Other  Assessment Events: blood not aspirated, injection not painful, no injection resistance, negative IV test and no paresthesia  Additional Notes Patient identified. Risks and benefits discussed including failed block, incomplete  Pain control, post dural puncture headache, nerve damage, paralysis, blood pressure Changes, nausea, vomiting, reactions to medications-both toxic and allergic and post Partum back pain. All questions were answered. Patient expressed understanding and wished to proceed. Sterile technique was used throughout procedure. Epidural site was Dressed with sterile barrier dressing. No paresthesias, signs of intravascular injection Or signs of intrathecal spread were encountered.  Patient was more comfortable after the epidural was dosed. Please see RN's note for documentation of vital signs and FHR which are stable.

## 2012-06-29 NOTE — Progress Notes (Signed)
  Subjective: Pt reports increased pressure with contractions.  Desires epidural.  Objective: BP 100/50  Pulse 90  Temp 98.6 F (37 C) (Oral)  Resp 20  Ht 5\' 6"  (1.676 m)  Wt 92.534 kg (204 lb)  BMI 32.93 kg/m2  LMP 10/25/2011      FHT:  FHR: 120's bpm, variability: moderate,  accelerations:  Present,  decelerations:  Present mod variable decels. UC:   regular, every 2-3.5 minutes; [id number] SVE:   Dilation: 5 Effacement (%): 80 Station: -1 Exam by:: [employer], rn  Labs: Lab Results  Component Value Date   WBC 9.7 06/28/2012   HGB 11.6* 06/28/2012   HCT 34.0* 06/28/2012   MCV 81.5 06/28/2012   PLT 209 06/28/2012    Assessment / Plan: Augmentation of labor, progressing well  Labor: Progressing on pitocin Preeclampsia:  n/a Fetal Wellbeing:  Category II Pain Control:  Epidural Ordered I/D:  n/a Anticipated MOD:  NSVD  MUHAMMAD,WALIDAH 06/29/2012, 3:02 AM

## 2012-06-30 MED ORDER — PNEUMOCOCCAL VAC POLYVALENT 25 MCG/0.5ML IJ INJ
0.5000 mL | INJECTION | INTRAMUSCULAR | Status: DC
  Filled 2012-06-30: qty 0.5

## 2012-06-30 NOTE — Discharge Summary (Signed)
  Obstetric Discharge Summary Reason for Admission: PPROM Prenatal Procedures: none Intrapartum Procedures: spontaneous vaginal delivery Postpartum Procedures: none Complications-Operative and Postpartum: none Hemoglobin  Date Value Range Status  06/28/2012 11.6* 12.0 - 15.0 g/dL Final     HCT  Date Value Range Status  06/28/2012 34.0* 36.0 - 46.0 % Final    Physical Exam:  General: alert, cooperative and no distress Lochia: appropriate Uterine Fundus: firm Incision: n/a DVT Evaluation: No evidence of DVT seen on physical exam. Negative Homan's sign. Calf/Ankle edema is present. 1+ pitting B/L LE  Discharge Diagnoses: PPROM delivered SVD  Discharge Information: Date: 06/30/2012 Activity: pelvic rest Diet: routine Medications: PNV and Ibuprofen Condition: stable Instructions: refer to practice specific booklet Discharge to: home Contraception: desires BTL when financially able, condoms in meantime Follow UP: [facility] in 4-6 weeks. Call office for appt  Newborn Data: Live born female  Birth Weight: 5 lb 12.4 oz (2620 g) APGAR: 8, 9 Breast feeding well  Home with mother.  Lewie Chamber 06/30/2012, 7:24 AM

## 2012-06-30 NOTE — Clinical Social Work Maternal (Signed)
Clinical Social Work Department  PSYCHOSOCIAL ASSESSMENT - MATERNAL/CHILD  06/30/2012  Patient: Katrina Mitchell,Katrina Mitchell Account Number: [id number] Admit Date: 06/28/2012  Childs Name:  Liam Halabuk   Clinical Social Worker: Sascha Palma, LCSWA Date/Time: 06/30/2012 02:41 PM  Date Referred: 06/30/2012  Referral source   CN    Referred reason   Substance Abuse   Other referral source:  I: FAMILY / HOME ENVIRONMENT  Child's legal guardian: PARENT  Guardian - Name  Guardian - Age  Guardian - Address   Rebeckah Red  [address] [place],  [address]   Other household support members/support persons  Name  Relationship  DOB   Louise Schweikert  FRIEND    Levi  FRIEND    Other support:  II PSYCHOSOCIAL DATA  Information Source: Patient Interview  Financial and Community Resources  Employment:  Financial resources: Medicaid  If Medicaid - County: [place]  Other   WIC   School / Grade:  Maternity Care Coordinator / Child Services Coordination / Early Interventions: Cultural issues impacting care:  III STRENGTHS  Strengths   Adequate Resources   Home prepared for Child (including basic supplies)   Supportive family/friends   Strength comment:  IV RISK FACTORS AND CURRENT PROBLEMS  Current Problem: YES  Risk Factor & Current Problem  Patient Issue  Family Issue  Risk Factor / Current Problem Comment   Substance Abuse  Y  N  Hx of substance use   V SOCIAL WORK ASSESSMENT  Sw referral received to assess pt's history of MJ use. Pt admits to smoking MJ, "a few times a week," estimating 2-3 times a week, prior to pregnancy confirmation at 2 weeks. Once pregnancy was confirmed, she continued to smoke because it helped with sickness. The last time she smoked was one week ago. She denies other illegal substance use during the pregnancy. Sw explained hospital drug testing policy and pt verbalized understanding. UDS is negative, meconium results are pending. Pt has 2 other children who live with  her mother. According to the pt, her mother was awarded custody of the children after, pt's ex (the children's father), was deemed unfit. Pt told Sw that she was living in [place] at the time but [place] awarded custody to her parents. She denies any CPS involvement. Pt told Sw that she and her parents share custody now. She plans to get her children back once she is stable. Her children have been out of her care for 3 years. Pt explained that this child is biracial and therefore her parents don't want anything to do with him. She denies any history of depression or SI. Pt plans to borrow a car seat from a friend prior to discharge. She also plans to reach out to her pregnancy care manager for assistance with a pack & play. Pt has clothes and diapers for the infant. Pt's support system seems to be limited. Sw will monitor drug screen results and make a referral if needed.   VI SOCIAL WORK PLAN  Social Work Plan   No Further Intervention Required / No Barriers to Discharge   Type of pt/family education:  If child protective services report - county:  If child protective services report - date:  Information/referral to community resources comment:  Other social work plan:   

## 2012-06-30 NOTE — Progress Notes (Signed)
UR Chart review completed.  

## 2012-07-01 DIAGNOSIS — Z302 Encounter for sterilization: Secondary | ICD-10-CM | POA: Insufficient documentation

## 2012-07-01 MED ORDER — PNEUMOCOCCAL VAC POLYVALENT 25 MCG/0.5ML IJ INJ
0.5000 mL | INJECTION | Freq: Once | INTRAMUSCULAR | Status: AC
  Administered 2012-07-01: 0.5 mL via INTRAMUSCULAR
  Filled 2012-07-01: qty 0.5

## 2012-07-01 MED ORDER — IBUPROFEN 600 MG PO TABS: 600.0000 mg | ORAL_TABLET | Freq: Four times a day (QID) | ORAL | Status: AC

## 2012-07-01 NOTE — Discharge Summary (Signed)
  Obstetric Discharge Summary Reason for Admission: PPROM Prenatal Procedures: none Intrapartum Procedures: spontaneous vaginal delivery Postpartum Procedures: none Complications-Operative and Postpartum: none Hemoglobin  Date Value Range Status  06/28/2012 11.6* 12.0 - 15.0 g/dL Final     HCT  Date Value Range Status  06/28/2012 34.0* 36.0 - 46.0 % Final    Physical Exam:  General: alert, cooperative and no distress Lochia: appropriate Uterine Fundus: firm Incision: n/a DVT Evaluation: No evidence of DVT seen on physical exam.  Discharge Diagnoses: PPROM delivered  Discharge Information: Date: 07/01/2012 Activity: pelvic rest Diet: routine Medications: PNV and Ibuprofen Condition: stable Instructions: refer to practice specific booklet Discharge to: home Contraception: desires BTL when financially able, condoms in meantime  Follow UP: [facility] in 4-6 weeks. Call office for appt  Follow-up Information    Follow up with [facility]. Schedule an appointment as soon as possible for a visit in 6 weeks.   Contact information:   [address] [place] [address]          Newborn Data: Live born female  Birth Weight: 5 lb 12.4 oz (2620 g) APGAR: 8, 9 Breast feeding well Circumcision outpatient when able to afford  Home with mother.  Lewie Chamber 07/01/2012, 7:45 AM  I have seen and examined this patient and I agree with the above. Cam Hai 7:55 AM 07/01/2012

## 2012-07-01 NOTE — Discharge Summary (Signed)
Pt seen and examined by team. Agree with above note. Candence Sease H.

## 2012-07-08 ENCOUNTER — Other Ambulatory Visit: Payer: Self-pay | Admitting: Obstetrics & Gynecology

## 2012-07-25 ENCOUNTER — Emergency Department (HOSPITAL_COMMUNITY): Payer: Medicaid Other

## 2012-07-25 ENCOUNTER — Emergency Department (HOSPITAL_COMMUNITY)
Admission: EM | Admit: 2012-07-25 | Discharge: 2012-07-25 | Disposition: A | Discharge status: Home / Self Care | Payer: Medicaid Other | Attending: Emergency Medicine | Admitting: Emergency Medicine

## 2012-07-25 ENCOUNTER — Encounter (HOSPITAL_COMMUNITY): Payer: Self-pay | Admitting: *Deleted

## 2012-07-25 DIAGNOSIS — R059 Cough, unspecified: Secondary | ICD-10-CM | POA: Insufficient documentation

## 2012-07-25 DIAGNOSIS — Z79899 Other long term (current) drug therapy: Secondary | ICD-10-CM | POA: Insufficient documentation

## 2012-07-25 DIAGNOSIS — J45909 Unspecified asthma, uncomplicated: Secondary | ICD-10-CM | POA: Insufficient documentation

## 2012-07-25 DIAGNOSIS — R05 Cough: Secondary | ICD-10-CM | POA: Insufficient documentation

## 2012-07-25 DIAGNOSIS — R0602 Shortness of breath: Secondary | ICD-10-CM | POA: Insufficient documentation

## 2012-07-25 DIAGNOSIS — J40 Bronchitis, not specified as acute or chronic: Secondary | ICD-10-CM

## 2012-07-25 MED ORDER — METHYLPREDNISOLONE SODIUM SUCC 125 MG IJ SOLR
125.0000 mg | Freq: Once | INTRAMUSCULAR | Status: AC
  Administered 2012-07-25: 125 mg via INTRAVENOUS
  Filled 2012-07-25: qty 2

## 2012-07-25 MED ORDER — ALBUTEROL (5 MG/ML) CONTINUOUS INHALATION SOLN
15.0000 mg | INHALATION_SOLUTION | Freq: Once | RESPIRATORY_TRACT | Status: AC
  Administered 2012-07-25: 15 mg via RESPIRATORY_TRACT
  Filled 2012-07-25: qty 20

## 2012-07-25 MED ORDER — FLUTICASONE-SALMETEROL 250-50 MCG/DOSE IN AEPB: 1.0000 | INHALATION_SPRAY | Freq: Two times a day (BID) | RESPIRATORY_TRACT | Status: DC

## 2012-07-25 MED ORDER — SODIUM CHLORIDE 0.9 % IV BOLUS (SEPSIS)
500.0000 mL | Freq: Once | INTRAVENOUS | Status: AC
  Administered 2012-07-25: 500 mL via INTRAVENOUS

## 2012-07-25 MED ORDER — SODIUM CHLORIDE 0.9 % IV SOLN: INTRAVENOUS | Status: DC

## 2012-07-25 MED ORDER — PREDNISONE 10 MG PO TABS: ORAL_TABLET | ORAL | Status: DC

## 2012-07-25 MED ORDER — ALBUTEROL SULFATE (5 MG/ML) 0.5% IN NEBU
INHALATION_SOLUTION | RESPIRATORY_TRACT | Status: AC
  Filled 2012-07-25: qty 3

## 2012-07-25 NOTE — ED Notes (Signed)
Patient states she is breathing much easier at present.

## 2012-07-25 NOTE — ED Notes (Signed)
Patient with no complaints at this time. Respirations even and unlabored. Skin warm/dry. Discharge instructions reviewed with patient at this time. Patient given opportunity to voice concerns/ask questions. IV removed per policy and band-aid applied to site. Patient discharged at this time and left Emergency Department with steady gait.  

## 2012-07-25 NOTE — ED Notes (Signed)
Recently completed course of Amoxil and Prednisone.  She is continuing to breast feed her infant.

## 2012-07-25 NOTE — ED Notes (Signed)
Pt has hx of asthma. Pt c/o cough and sob x 2 days.Has used her nebulizer and inhaler without relief.

## 2012-07-25 NOTE — ED Provider Notes (Signed)
History     CSN: [id number]  Arrival date & time 07/25/12  1850   First MD Initiated Contact with Patient 07/25/12 1858      Chief Complaint  Patient presents with  . Shortness of Breath  . Cough    (Consider location/radiation/quality/duration/timing/severity/associated sxs/prior treatment) HPI Comments: Katrina Mitchell is a 31 y.o. Female who has had shortness of breath, for several days. Is not improving using her usual home medications. She is breast-feeding, and one month postpartum. She completed a 10 day course of prednisone 40 mg daily on 07/19/12. She has had a cough, productive of yellow sputum. She denies fever. She's had mild anorexia today, but no vomiting, or diarrhea. She denies headache, chest pain, or weakness. She does not have any aggravating or palliative factors.  Patient is a 31 y.o. female presenting with shortness of breath and cough. The history is provided by the patient.  Shortness of Breath  Associated symptoms include cough and shortness of breath.  Cough Associated symptoms include shortness of breath.    Past Medical History  Diagnosis Date  . Asthma   . Pregnancy as incidental finding   . Complication of anesthesia     Past Surgical History  Procedure Date  . Cholecystectomy   . Nasal sinus surgery     History reviewed. No pertinent family history.  History  Substance Use Topics  . Smoking status: Former Smoker    Types: Cigarettes  . Smokeless tobacco: Never Used  . Alcohol Use: Yes     weekly    OB History    Grav Para Term Preterm Abortions TAB SAB Ect Mult Living   3 3 2 1  0 0 0 0 0 3      Review of Systems  Respiratory: Positive for cough and shortness of breath.   All other systems reviewed and are negative.    Allergies  Review of patient's allergies indicates no known allergies.  Home Medications   Current Outpatient Rx  Name Route Sig Dispense Refill  . ALBUTEROL SULFATE HFA 108 (90 BASE) MCG/ACT IN AERS  Inhalation Inhale 1-2 puffs into the lungs every 6 (six) hours as needed for wheezing. 1 Inhaler 0  . ALBUTEROL SULFATE (2.5 MG/3ML) 0.083% IN NEBU Nebulization Take 3 mLs (2.5 mg total) by nebulization every 6 (six) hours as needed for wheezing. 75 mL 12  . IPRATROPIUM-ALBUTEROL 0.5-2.5 (3) MG/3ML IN SOLN Nebulization Take 3 mLs by nebulization 2 (two) times daily. 75 mL 0  . LORATADINE 10 MG PO TABS Oral Take 10 mg by mouth daily.    Marland Kitchen OMEPRAZOLE 20 MG PO CPDR Oral Take 20 mg by mouth daily.    Marland Kitchen OVER THE COUNTER MEDICATION Oral Take 2 tablets by mouth daily. Patient takes gummi prenatal vitamins    . FLUTICASONE-SALMETEROL 250-50 MCG/DOSE IN AEPB Inhalation Inhale 1 puff into the lungs 2 (two) times daily. 60 each 0  . PREDNISONE 10 MG PO TABS  Take q day 6,5,4,3,2,1 21 tablet 0    BP 105/63  Pulse 118  Temp 99.1 F (37.3 C) (Oral)  Resp 24  Ht 5\' 7"  (1.702 m)  Wt 180 lb (81.647 kg)  BMI 28.19 kg/m2  SpO2 100%  LMP 10/25/2011  Breastfeeding? Yes  Physical Exam  Nursing note and vitals reviewed. Constitutional: She is oriented to person, place, and time. She appears well-developed and well-nourished.  HENT:  Head: Normocephalic and atraumatic.  Eyes: Conjunctivae and EOM are normal. Pupils are equal, round,  and reactive to light.  Neck: Normal range of motion and phonation normal. Neck supple.  Cardiovascular: Normal rate, regular rhythm and intact distal pulses.   Pulmonary/Chest: She is in respiratory distress (mild increased work of breathing.). She has wheezes. She exhibits no tenderness.  Abdominal: Soft. She exhibits no distension. There is no tenderness. There is no guarding.  Musculoskeletal: Normal range of motion. She exhibits no edema and no tenderness.  Neurological: She is alert and oriented to person, place, and time. She has normal strength. She exhibits normal muscle tone.  Skin: Skin is warm and dry.  Psychiatric: She has a normal mood and affect. Her behavior is  normal. Judgment and thought content normal.    ED Course  Procedures (including critical care time)  Emergency department treatment: IV fluid bolus,  Albuterol nebulizer 50 mg continuous. IV, Solu-Medrol.  Reevaluation: 21:30- she feels improved, and the lungs have good air movement.      Labs Reviewed - No data to display Dg Chest 2 View  07/25/2012  *RADIOLOGY REPORT*  Clinical Data: Shortness of breath.  CHEST - 2 VIEW  Comparison: None.  Findings: Heart and mediastinal contours are within normal limits. No focal opacities or effusions.  No acute bony abnormality.  IMPRESSION: No active cardiopulmonary disease.  Original Report Authenticated By: Cyndie Chime, M.D.     1. Bronchitis       MDM  Recurrent bronchitis, with recent use of prednisone. She's not taking preventative medication, currently. She is improved, and stable for discharge. She will likely benefit from pulmonary followup.     Plan: Home Medications- Prednisone taper, Advair; Home Treatments- rest; Recommended follow up- Pulm. referral          Flint Melter, MD 07/26/12 [phone]

## 2012-08-07 ENCOUNTER — Encounter (HOSPITAL_COMMUNITY): Payer: Self-pay | Admitting: Pharmacy Technician

## 2012-08-12 ENCOUNTER — Other Ambulatory Visit: Payer: Self-pay | Admitting: Obstetrics & Gynecology

## 2012-08-12 ENCOUNTER — Other Ambulatory Visit: Payer: Self-pay | Admitting: Advanced Practice Midwife

## 2012-08-12 NOTE — Patient Instructions (Addendum)
Your procedure is scheduled on:  08/19/2012  Report to [facility] at    7:00  AM.  Call this number if you have problems the morning of surgery: [phone]   Remember:   Do not drink or eat food:After Midnight.    Take these medicines the morning of surgery with A SIP OF WATER: Prilosec. Also, use your breathing treatments, Albuterol, Advair and  Duoneb before coming in and bring your inhalers with you.   Do not wear jewelry, make-up or nail polish.  Do not wear lotions, powders, or perfumes. You may wear deodorant.  Do not shave 48 hours prior to surgery.  Do not bring valuables to the hospital.  Contacts, dentures or bridgework may not be worn into surgery.  Leave suitcase in the car. After surgery it may be brought to your room.  For patients admitted to the hospital, checkout time is 11:00 AM the day of discharge.   Patients discharged the day of surgery will not be allowed to drive home.   Special Instructions: CHG Shower use special wash: 1/2 bottle night before surgery and 1/2 bottle morning of surgery.   Please read over the following fact sheets that you were given: Pain Booklet, MRSA  Surgical Site Infection Prevention, Anesthesia   Post-op Instructions and Care and Recovery After Surgery     Laparoscopic Tubal Ligation Laparoscopic tubal ligation is a procedure that closes the fallopian tubes. Tubal ligation is also known as getting your "tubes tied." It is a brief, common and relatively simple surgical procedure. Tubal ligation is done to cause sterilization. Sterilization means you will not be able to get pregnant or have a baby. Sometimes a tubal ligation may be reversed, but this should not be considered a possibility because of failure to get pregnant and the increased risk of tubal (ectopic) pregnancy. If you want to have future pregnancies, you should not have this procedure. Use other forms of contraception. Tubal ligation can be done at any time during your menstrual cycle.  This procedure has a less than 1% failure rate. Tubal ligation does not protect against sexually transmitted disease. LET YOUR CAREGIVER KNOW ABOUT:  Allergies, especially to medicines.   All the medicines you are taking, even herbs, eyedrops, steroids, creams, and over-the-counter drugs.   The possibility of being pregnant.   Past problems with medicines.   History of blood clots or other blood problems.   Past surgeries, medical or health problems.  RISKS AND COMPLICATIONS  Your caregiver will discuss the risks with you before the procedure. Some problems that can happen after this procedure include:  Infection. A germ starts growing in one of the wounds. This can usually be treated with antibiotic medicines.   Bleeding following surgery. Your surgeon takes every precaution to keep this from happening.   Damage to other organs. If damage to other organs or excessive bleeding should occur, it may be necessary to convert the laparoscopic procedure into an open abdominal procedure. Scarring (adhesions) from past surgeries or disease may also be a cause to change this procedure to an open abdominal operation.   Anesthetic side effects.  BEFORE THE PROCEDURE  Do not take aspirin or blood thinners a week before the procedure. This can cause bleeding. Do as directed by your caregiver.   Do not eat or drink anything 6 to 8 hours before the procedure.   Let your caregiver know if you get a cold or an infection before the procedure.   Arrive at the  clinic or hospital 60 minutes before the surgery or as directed.  PROCEDURE   You may be given a mild drug (sedative) to help you relax before the procedure. Once in the operating room, you will be given a general anesthetic to make you sleep (unless you and your caregiver choose a different anesthetic).   Once you are sleeping, your surgeon makes your abdomen larger with a harmless gas (carbon dioxide). This makes your organs easier to see  and gives room to operate.   A laparoscope is then inserted into your abdomen through a small cut (incision) below the navel. A laparoscope is a thin, lighted, pencil-sized instrument. It is like a telescope. Once inserted, your caregiver can look at the organs inside your abdomen. He or she can see if there is anything abnormal.   Other small instruments also are put into the abdomen through other small openings (portals). Many surgeons attach a video camera to the laparoscope to enlarge the view. The fallopian tubes are located and either burned closed (cauterized) or a plastic clip is placed on them to close the tubes.  Sometimes, your surgeon may take tissue samples (biopsies) from other organs if he or she sees something abnormal. Biopsies can help to diagnose or confirm a disease. The samples are examined under a microscope. AFTER THE PROCEDURE   The gas is released from inside your abdomen.   Your incisions are closed with stitches (sutures). Because these incisions are small (usually less than  inch), there is usually little discomfort following the procedure.   You will rest in a recovery room until you are stable and doing well.   As long as there are no problems, you may be allowed to go home.   Someone will need to drive you home.   You will have some mild discomfort in the throat. This is from the tube placed in your throat while you were sleeping.   You may experience discomfort in the shoulder area from some trapped air between the liver and diaphragm. This will slowly go away on its own.   You will also have some mild abdominal discomfort. You will also have discomfort from the incisions where the instruments were placed into your abdomen.  HOME CARE INSTRUCTIONS   Only take over-the-counter or prescription medicines for pain, discomfort, or fever as directed by your caregiver. Do not take aspirin. It can cause bleeding.   Resume daily activities, diet, rest, driving, and  work as directed.   Showers are preferred over baths.   Resume sexual activities in 1 week or as directed.   If biopsies were taken, find out how to get your results. The results are usually given during the follow-up visit. Do not assume tests are normal if you do not hear from your caregiver. It is your responsibility to obtain your results.   Change the dressings as directed.   It is helpful to have someone with you for several hours after you go home. They can help you and be available if you have problems.  SEEK MEDICAL CARE IF:   You have increasing abdominal pain.   You develop new pain in your shoulders in the shoulder strap area that gets worse with time. Some pain is common and expected.   You feel lightheaded or faint.   You have chills or fever.   You develop bleeding or drainage from the suture sites or the vagina after surgery.   You develop chest pain.   You develop  shortness of breath.   You develop a rash.  Document Released: 03/03/2001 Document Revised: 11/14/2011 Document Reviewed: 06/15/2008 [facility] Patient Information 2012 [place], [place].   PATIENT INSTRUCTIONS POST-ANESTHESIA  IMMEDIATELY FOLLOWING SURGERY:  Do not drive or operate machinery for the first twenty four hours after surgery.  Do not make any important decisions for twenty four hours after surgery or while taking narcotic pain medications or sedatives.  If you develop intractable nausea and vomiting or a severe headache please notify your doctor immediately.  FOLLOW-UP:  Please make an appointment with your surgeon as instructed. You do not need to follow up with anesthesia unless specifically instructed to do so.  WOUND CARE INSTRUCTIONS (if applicable):  Keep a dry clean dressing on the anesthesia/puncture wound site if there is drainage.  Once the wound has quit draining you may leave it open to air.  Generally you should leave the bandage intact for twenty four hours unless there is  drainage.  If the epidural site drains for more than 36-48 hours please call the anesthesia department.  QUESTIONS?:  Please feel free to call your physician or the hospital operator if you have any questions, and they will be happy to assist you.

## 2012-08-13 ENCOUNTER — Encounter (HOSPITAL_COMMUNITY)
Admission: RE | Admit: 2012-08-13 | Discharge: 2012-08-13 | Disposition: A | Discharge status: Home / Self Care | Payer: Medicaid Other | Source: Ambulatory Visit | Attending: Obstetrics & Gynecology | Admitting: Obstetrics & Gynecology

## 2012-08-13 ENCOUNTER — Encounter (HOSPITAL_COMMUNITY): Payer: Self-pay

## 2012-08-13 HISTORY — DX: Gastro-esophageal reflux disease without esophagitis: K21.9

## 2012-08-13 LAB — URINALYSIS, ROUTINE W REFLEX MICROSCOPIC
Bilirubin Urine: NEGATIVE
Nitrite: NEGATIVE
Specific Gravity, Urine: 1.025 (ref 1.005–1.030)
pH: 7 (ref 5.0–8.0)

## 2012-08-13 LAB — COMPREHENSIVE METABOLIC PANEL
ALT: 22 U/L (ref 0–35)
AST: 17 U/L (ref 0–37)
Albumin: 3.4 g/dL — ABNORMAL LOW (ref 3.5–5.2)
Calcium: 9.5 mg/dL (ref 8.4–10.5)
GFR calc Af Amer: >90 mL/min (ref >90)
Glucose, Bld: 124 mg/dL — ABNORMAL HIGH (ref 70–99)
Sodium: 140 mEq/L (ref 135–145)
Total Protein: 6.4 g/dL (ref 6.0–8.3)

## 2012-08-13 LAB — SURGICAL PCR SCREEN
MRSA, PCR: NEGATIVE
Staphylococcus aureus: NEGATIVE

## 2012-08-13 LAB — CBC
MCH: 27.5 pg (ref 26.0–34.0)
MCHC: 33.1 g/dL (ref 30.0–36.0)
Platelets: 275 10*3/uL (ref 150–400)
RDW: 13.1 % (ref 11.5–15.5)

## 2012-08-19 ENCOUNTER — Encounter (HOSPITAL_COMMUNITY): Payer: Self-pay | Admitting: *Deleted

## 2012-08-19 ENCOUNTER — Ambulatory Visit (HOSPITAL_COMMUNITY)
Admission: RE | Admit: 2012-08-19 | Discharge: 2012-08-19 | Disposition: A | Discharge status: Home / Self Care | Payer: Medicaid Other | Source: Ambulatory Visit | Attending: Obstetrics & Gynecology | Admitting: Obstetrics & Gynecology

## 2012-08-19 ENCOUNTER — Encounter (HOSPITAL_COMMUNITY): Payer: Self-pay | Admitting: Anesthesiology

## 2012-08-19 ENCOUNTER — Encounter (HOSPITAL_COMMUNITY)
Admission: RE | Disposition: A | Discharge status: Home / Self Care | Payer: Self-pay | Source: Ambulatory Visit | Attending: Obstetrics & Gynecology

## 2012-08-19 ENCOUNTER — Ambulatory Visit (HOSPITAL_COMMUNITY): Payer: Medicaid Other | Admitting: Anesthesiology

## 2012-08-19 DIAGNOSIS — Z302 Encounter for sterilization: Secondary | ICD-10-CM | POA: Insufficient documentation

## 2012-08-19 HISTORY — PX: LAPAROSCOPIC TUBAL LIGATION: SHX1937

## 2012-08-19 SURGERY — LIGATION, FALLOPIAN TUBE, LAPAROSCOPIC
Anesthesia: General | Laterality: Bilateral | Wound class: Clean Contaminated

## 2012-08-19 MED ORDER — FENTANYL CITRATE 0.05 MG/ML IJ SOLN
25.0000 ug | INTRAMUSCULAR | Status: DC | PRN
  Administered 2012-08-19: 50 ug via INTRAVENOUS

## 2012-08-19 MED ORDER — DEXAMETHASONE SODIUM PHOSPHATE 4 MG/ML IJ SOLN
4.0000 mg | Freq: Once | INTRAMUSCULAR | Status: AC
  Administered 2012-08-19: 4 mg via INTRAVENOUS

## 2012-08-19 MED ORDER — LIDOCAINE HCL (PF) 1 % IJ SOLN
INTRAMUSCULAR | Status: AC
  Filled 2012-08-19: qty 5

## 2012-08-19 MED ORDER — MIDAZOLAM HCL 2 MG/2ML IJ SOLN
1.0000 mg | INTRAMUSCULAR | Status: DC | PRN
  Administered 2012-08-19: 2 mg via INTRAVENOUS

## 2012-08-19 MED ORDER — GLYCOPYRROLATE 0.2 MG/ML IJ SOLN
INTRAMUSCULAR | Status: AC
  Filled 2012-08-19: qty 2

## 2012-08-19 MED ORDER — ONDANSETRON HCL 4 MG/2ML IJ SOLN
4.0000 mg | Freq: Once | INTRAMUSCULAR | Status: AC | PRN
  Administered 2012-08-19: 4 mg via INTRAVENOUS

## 2012-08-19 MED ORDER — ROCURONIUM BROMIDE 100 MG/10ML IV SOLN
INTRAVENOUS | Status: DC | PRN
  Administered 2012-08-19: 10 mg via INTRAVENOUS
  Administered 2012-08-19: 30 mg via INTRAVENOUS

## 2012-08-19 MED ORDER — FENTANYL CITRATE 0.05 MG/ML IJ SOLN
INTRAMUSCULAR | Status: DC | PRN
  Administered 2012-08-19 (×2): 50 ug via INTRAVENOUS
  Administered 2012-08-19: 100 ug via INTRAVENOUS

## 2012-08-19 MED ORDER — ONDANSETRON HCL 8 MG PO TABS: 8.0000 mg | ORAL_TABLET | Freq: Three times a day (TID) | ORAL | Status: AC | PRN

## 2012-08-19 MED ORDER — SCOPOLAMINE 1 MG/3DAYS TD PT72
1.0000 | MEDICATED_PATCH | TRANSDERMAL | Status: DC
  Administered 2012-08-19: 1.5 mg via TRANSDERMAL

## 2012-08-19 MED ORDER — LIDOCAINE HCL (CARDIAC) 10 MG/ML IV SOLN
INTRAVENOUS | Status: DC | PRN
  Administered 2012-08-19: 10 mg via INTRAVENOUS

## 2012-08-19 MED ORDER — PROMETHAZINE HCL 25 MG/ML IJ SOLN
INTRAMUSCULAR | Status: AC
  Filled 2012-08-19: qty 1

## 2012-08-19 MED ORDER — FENTANYL CITRATE 0.05 MG/ML IJ SOLN
INTRAMUSCULAR | Status: AC
  Filled 2012-08-19: qty 2

## 2012-08-19 MED ORDER — CEFAZOLIN SODIUM-DEXTROSE 2-3 GM-% IV SOLR
INTRAVENOUS | Status: AC
  Filled 2012-08-19: qty 50

## 2012-08-19 MED ORDER — NEOSTIGMINE METHYLSULFATE 1 MG/ML IJ SOLN
INTRAMUSCULAR | Status: DC | PRN
  Administered 2012-08-19: 2 mg via INTRAVENOUS

## 2012-08-19 MED ORDER — GLYCOPYRROLATE 0.2 MG/ML IJ SOLN
INTRAMUSCULAR | Status: DC | PRN
  Administered 2012-08-19: 0.2 mg via INTRAVENOUS
  Administered 2012-08-19: 0.4 mg via INTRAVENOUS

## 2012-08-19 MED ORDER — DEXAMETHASONE SODIUM PHOSPHATE 4 MG/ML IJ SOLN
INTRAMUSCULAR | Status: AC
  Filled 2012-08-19: qty 1

## 2012-08-19 MED ORDER — ALBUTEROL SULFATE HFA 108 (90 BASE) MCG/ACT IN AERS
INHALATION_SPRAY | RESPIRATORY_TRACT | Status: AC
  Filled 2012-08-19: qty 6.7

## 2012-08-19 MED ORDER — 0.9 % SODIUM CHLORIDE (POUR BTL) OPTIME
TOPICAL | Status: DC | PRN
  Administered 2012-08-19: 1000 mL

## 2012-08-19 MED ORDER — PROMETHAZINE HCL 25 MG/ML IJ SOLN
6.2500 mg | Freq: Four times a day (QID) | INTRAMUSCULAR | Status: DC | PRN
  Administered 2012-08-19: 6.25 mg via INTRAVENOUS

## 2012-08-19 MED ORDER — PROPOFOL 10 MG/ML IV EMUL
INTRAVENOUS | Status: AC
  Filled 2012-08-19: qty 20

## 2012-08-19 MED ORDER — SCOPOLAMINE 1 MG/3DAYS TD PT72
MEDICATED_PATCH | TRANSDERMAL | Status: AC
  Filled 2012-08-19: qty 1

## 2012-08-19 MED ORDER — KETOROLAC TROMETHAMINE 30 MG/ML IJ SOLN
INTRAMUSCULAR | Status: AC
  Filled 2012-08-19: qty 1

## 2012-08-19 MED ORDER — KETOROLAC TROMETHAMINE 10 MG PO TABS: 10.0000 mg | ORAL_TABLET | Freq: Three times a day (TID) | ORAL | Status: AC | PRN

## 2012-08-19 MED ORDER — ONDANSETRON HCL 4 MG/2ML IJ SOLN
INTRAMUSCULAR | Status: AC
  Filled 2012-08-19: qty 2

## 2012-08-19 MED ORDER — LACTATED RINGERS IV SOLN
INTRAVENOUS | Status: DC
  Administered 2012-08-19: 1000 mL via INTRAVENOUS

## 2012-08-19 MED ORDER — HYDROCODONE-ACETAMINOPHEN 5-500 MG PO TABS: 1.0000 | ORAL_TABLET | Freq: Four times a day (QID) | ORAL | Status: AC | PRN

## 2012-08-19 MED ORDER — CEFAZOLIN SODIUM-DEXTROSE 2-3 GM-% IV SOLR
2.0000 g | INTRAVENOUS | Status: AC
  Administered 2012-08-19: 2 g via INTRAVENOUS

## 2012-08-19 MED ORDER — ALBUTEROL SULFATE HFA 108 (90 BASE) MCG/ACT IN AERS
INHALATION_SPRAY | RESPIRATORY_TRACT | Status: DC | PRN
  Administered 2012-08-19 (×3): 2 via RESPIRATORY_TRACT

## 2012-08-19 MED ORDER — ONDANSETRON HCL 4 MG/2ML IJ SOLN
4.0000 mg | Freq: Once | INTRAMUSCULAR | Status: AC
  Administered 2012-08-19: 4 mg via INTRAVENOUS

## 2012-08-19 MED ORDER — GLYCOPYRROLATE 0.2 MG/ML IJ SOLN
INTRAMUSCULAR | Status: AC
  Filled 2012-08-19: qty 1

## 2012-08-19 MED ORDER — PROPOFOL 10 MG/ML IV BOLUS
INTRAVENOUS | Status: DC | PRN
  Administered 2012-08-19: 150 mg via INTRAVENOUS
  Administered 2012-08-19: 50 mg via INTRAVENOUS

## 2012-08-19 MED ORDER — MIDAZOLAM HCL 2 MG/2ML IJ SOLN
INTRAMUSCULAR | Status: AC
  Filled 2012-08-19: qty 2

## 2012-08-19 MED ORDER — NEOSTIGMINE METHYLSULFATE 1 MG/ML IJ SOLN
INTRAMUSCULAR | Status: AC
  Filled 2012-08-19: qty 10

## 2012-08-19 MED ORDER — KETOROLAC TROMETHAMINE 30 MG/ML IJ SOLN
INTRAMUSCULAR | Status: DC | PRN
  Administered 2012-08-19: 30 mg via INTRAVENOUS

## 2012-08-19 SURGICAL SUPPLY — 31 items
BAG HAMPER (MISCELLANEOUS) ×2 IMPLANT
BLADE SURG SZ11 CARB STEEL (BLADE) ×2 IMPLANT
CLOTH BEACON ORANGE TIMEOUT ST (SAFETY) ×2 IMPLANT
COVER LIGHT HANDLE STERIS (MISCELLANEOUS) ×4 IMPLANT
ELECT REM PT RETURN 9FT ADLT (ELECTROSURGICAL) ×2
ELECTRODE REM PT RTRN 9FT ADLT (ELECTROSURGICAL) ×1 IMPLANT
GLOVE BIOGEL PI IND STRL 8 (GLOVE) ×1 IMPLANT
GLOVE BIOGEL PI INDICATOR 8 (GLOVE) ×1
GLOVE ECLIPSE 8.0 STRL XLNG CF (GLOVE) ×2 IMPLANT
GLOVE INDICATOR 7.0 STRL GRN (GLOVE) ×2 IMPLANT
GLOVE SS BIOGEL STRL SZ 6.5 (GLOVE) ×1 IMPLANT
GLOVE SUPERSENSE BIOGEL SZ 6.5 (GLOVE) ×1
GOWN STRL REIN XL XLG (GOWN DISPOSABLE) ×2 IMPLANT
INST SET LAPROSCOPIC GYN AP (KITS) ×2 IMPLANT
KIT ROOM TURNOVER AP CYSTO (KITS) ×2 IMPLANT
MANIFOLD NEPTUNE II (INSTRUMENTS) ×2 IMPLANT
NEEDLE INSUFFLATION 14GA 120MM (NEEDLE) ×2 IMPLANT
PACK PERI GYN (CUSTOM PROCEDURE TRAY) ×2 IMPLANT
PAD ARMBOARD 7.5X6 YLW CONV (MISCELLANEOUS) ×2 IMPLANT
SCALPEL HARMONIC ACE (MISCELLANEOUS) ×2 IMPLANT
SET BASIN LINEN APH (SET/KITS/TRAYS/PACK) ×2 IMPLANT
SOLUTION ANTI FOG 6CC (MISCELLANEOUS) ×2 IMPLANT
SPONGE GAUZE 2X2 8PLY STRL LF (GAUZE/BANDAGES/DRESSINGS) ×4 IMPLANT
STAPLER VISISTAT 35W (STAPLE) ×2 IMPLANT
SUT VICRYL 0 UR6 27IN ABS (SUTURE) ×2 IMPLANT
SYRINGE 10CC LL (SYRINGE) ×2 IMPLANT
TAPE CLOTH SURG 4X10 WHT LF (GAUZE/BANDAGES/DRESSINGS) ×2 IMPLANT
TROCAR Z-THRD FIOS HNDL 11X100 (TROCAR) ×2 IMPLANT
TROCAR Z-THREAD FIOS 5X100MM (TROCAR) ×2 IMPLANT
TUBING INSUFFLATION HIGH FLOW (TUBING) ×2 IMPLANT
WARMER LAPAROSCOPE (MISCELLANEOUS) ×2 IMPLANT

## 2012-08-19 NOTE — Anesthesia Preprocedure Evaluation (Signed)
Anesthesia Evaluation  Patient identified by MRN, date of birth, ID band Patient awake    Reviewed: Allergy & Precautions, H&P , Patient's Chart, lab work & pertinent test results  Airway Mallampati: I TM Distance: >3 FB Neck ROM: full    Dental No notable dental hx. (+) Teeth Intact   Pulmonary asthma ,  breath sounds clear to auscultation  Pulmonary exam normal       Cardiovascular negative cardio ROS  Rhythm:regular Rate:Normal     Neuro/Psych negative neurological ROS  negative psych ROS   GI/Hepatic negative GI ROS, Neg liver ROS, GERD-  Medicated and Controlled,  Endo/Other  negative endocrine ROS  Renal/GU negative Renal ROS  negative genitourinary   Musculoskeletal   Abdominal Normal abdominal exam  (+)   Peds  Hematology negative hematology ROS (+)   Anesthesia Other Findings   Reproductive/Obstetrics                           Anesthesia Physical Anesthesia Plan  ASA: II  Anesthesia Plan: General   Post-op Pain Management:    Induction: Intravenous, Rapid sequence and Cricoid pressure planned  Airway Management Planned: Oral ETT  Additional Equipment:   Intra-op Plan:   Post-operative Plan: Extubation in OR  Informed Consent: I have reviewed the patients History and Physical, chart, labs and discussed the procedure including the risks, benefits and alternatives for the proposed anesthesia with the patient or authorized representative who has indicated his/her understanding and acceptance.     Plan Discussed with:   Anesthesia Plan Comments:         Anesthesia Quick Evaluation

## 2012-08-19 NOTE — Anesthesia Postprocedure Evaluation (Signed)
Anesthesia Post Note  Patient: Katrina Mitchell  Procedure(s) Performed: Procedure(s) (LRB): LAPAROSCOPIC TUBAL LIGATION (Bilateral)  Anesthesia type: General  Patient location: PACU  Post pain: Pain level controlled  Post assessment: Post-op Vital signs reviewed, Patient's Cardiovascular Status Stable, Respiratory Function Stable, Patent Airway, No signs of Nausea or vomiting and Pain level controlled  Last Vitals:  Filed Vitals:   08/19/12 0946  BP: 116/64  Pulse: 75  Temp: 36.5 C  Resp: 16    Post vital signs: Reviewed and stable  Level of consciousness: awake and alert   Complications: No apparent anesthesia complications

## 2012-08-19 NOTE — Anesthesia Procedure Notes (Signed)
Procedure Name: Intubation Date/Time: 08/19/2012 8:55 AM Performed by: Franco Nones Pre-anesthesia Checklist: Patient identified, Patient being monitored, Timeout performed, Emergency Drugs available and Suction available Patient Re-evaluated:Patient Re-evaluated prior to inductionOxygen Delivery Method: Circle System Utilized Preoxygenation: Pre-oxygenation with 100% oxygen Intubation Type: IV induction and Cricoid Pressure applied Ventilation: Mask ventilation without difficulty Laryngoscope Size: Miller and 2 Grade View: Grade I Tube type: Oral Tube size: 7.0 mm Number of attempts: 1 Airway Equipment and Method: stylet Placement Confirmation: ETT inserted through vocal cords under direct vision,  positive ETCO2 and breath sounds checked- equal and bilateral Secured at: 21 cm Tube secured with: Tape Dental Injury: Teeth and Oropharynx as per pre-operative assessment

## 2012-08-19 NOTE — H&P (Signed)
Katrina Mitchell is an 31 y.o. female multiparous female desires permanent sterilization.  She understands permanent nature as well as failure rate.  OB History    Grav Para Term Preterm Abortions TAB SAB Ect Mult Living   3 3 2 1  0 0 0 0 0 3        Past Medical History  Diagnosis Date  . Asthma   . Pregnancy as incidental finding   . Complication of anesthesia   . GERD (gastroesophageal reflux disease)     Past Surgical History  Procedure Date  . Nasal sinus surgery   . Cholecystectomy 2001  . Lypoma removal 2003    right arm     History reviewed. No pertinent family history.  Social History:  reports that she has quit smoking. Her smoking use included Cigarettes. She has a 2 pack-year smoking history. She has never used smokeless tobacco. She reports that she does not drink alcohol or use illicit drugs.  Allergies: No Known Allergies  Prescriptions prior to admission  Medication Sig Dispense Refill  . albuterol (PROVENTIL HFA;VENTOLIN HFA) 108 (90 BASE) MCG/ACT inhaler Inhale 1-2 puffs into the lungs every 6 (six) hours as needed for wheezing.  1 Inhaler  0  . albuterol (PROVENTIL) (2.5 MG/3ML) 0.083% nebulizer solution Take 3 mLs (2.5 mg total) by nebulization every 6 (six) hours as needed for wheezing.  75 mL  12  . ciprofloxacin (CIPRO) 500 MG tablet Take 500 mg by mouth 2 (two) times daily.      . clindamycin (CLEOCIN) 300 MG capsule Take 300 mg by mouth 3 (three) times daily.      . Fluticasone-Salmeterol (ADVAIR DISKUS) 250-50 MCG/DOSE AEPB Inhale 1 puff into the lungs 2 (two) times daily.  60 each  0  . ipratropium-albuterol (DUONEB) 0.5-2.5 (3) MG/3ML SOLN Take 3 mLs by nebulization 2 (two) times daily.  75 mL  0  . loratadine (CLARITIN) 10 MG tablet Take 10 mg by mouth daily.      Marland Kitchen omeprazole (PRILOSEC) 20 MG capsule Take 20 mg by mouth daily.      . predniSONE (DELTASONE) 10 MG tablet Take 40 mg by mouth daily.      Marland Kitchen OVER THE COUNTER MEDICATION Take 2 tablets by  mouth daily. Patient takes gummi prenatal vitamins        ROS  Review of Systems  Constitutional: Negative for fever, chills, weight loss, malaise/fatigue and diaphoresis.  HENT: Negative for hearing loss, ear pain, nosebleeds, congestion, sore throat, neck pain, tinnitus and ear discharge.   Eyes: Negative for blurred vision, double vision, photophobia, pain, discharge and redness.  Respiratory: Negative for cough, hemoptysis, sputum production, shortness of breath, wheezing and stridor.   Cardiovascular: Negative for chest pain, palpitations, orthopnea, claudication, leg swelling and PND.  Gastrointestinal: Negative for abdominal pain. Negative for heartburn, nausea, vomiting, diarrhea, constipation, blood in stool and melena.  Genitourinary: Negative for dysuria, urgency, frequency, hematuria and flank pain.  Musculoskeletal: Negative for myalgias, back pain, joint pain and falls.  Skin: Negative for itching and rash.  Neurological: Negative for dizziness, tingling, tremors, sensory change, speech change, focal weakness, seizures, loss of consciousness, weakness and headaches.  Endo/Heme/Allergies: Negative for environmental allergies and polydipsia. Does not bruise/bleed easily.  Psychiatric/Behavioral: Negative for depression, suicidal ideas, hallucinations, memory loss and substance abuse. The patient is not nervous/anxious and does not have insomnia.      Blood pressure 108/76, pulse 83, temperature 98 F (36.7 C), temperature source Oral, resp.  rate 18, SpO2 99.00%, not currently breastfeeding. Physical Exam Physical Exam  Vitals reviewed. Constitutional: She is oriented to person, place, and time. She appears well-developed and well-nourished.  HENT:  Head: Normocephalic and atraumatic.  Right Ear: External ear normal.  Left Ear: External ear normal.  Nose: Nose normal.  Mouth/Throat: Oropharynx is clear and moist.  Eyes: Conjunctivae and EOM are normal. Pupils are equal,  round, and reactive to light. Right eye exhibits no discharge. Left eye exhibits no discharge. No scleral icterus.  Neck: Normal range of motion. Neck supple. No tracheal deviation present. No thyromegaly present.  Cardiovascular: Normal rate, regular rhythm, normal heart sounds and intact distal pulses.  Exam reveals no gallop and no friction rub.   No murmur heard. Respiratory: Effort normal and breath sounds normal. No respiratory distress. She has no wheezes. She has no rales. She exhibits no tenderness.  GI: Soft. Bowel sounds are normal. She exhibits no distension and no mass. There is tenderness. There is no rebound and no guarding.  Genitourinary:       Vulva is normal without lesions Vagina is pink moist without discharge Cervix normal in appearance and pap is normal Uterus is normal Adnexa is negative with normal sized ovaries by sonogram  Musculoskeletal: Normal range of motion. She exhibits no edema and no tenderness.  Neurological: She is alert and oriented to person, place, and time. She has normal reflexes. She displays normal reflexes. No cranial nerve deficit. She exhibits normal muscle tone. Coordination normal.  Skin: Skin is warm and dry. No rash noted. No erythema. No pallor.  Psychiatric: She has a normal mood and affect. Her behavior is normal. Judgment and thought content normal.     Recent Results (from the past 336 hour(s))  SURGICAL PCR SCREEN   Collection Time   08/13/12  9:11 AM      Component Value Range   MRSA, PCR NEGATIVE  NEGATIVE   Staphylococcus aureus NEGATIVE  NEGATIVE  URINALYSIS, ROUTINE W REFLEX MICROSCOPIC   Collection Time   08/13/12  9:11 AM      Component Value Range   Color, Urine YELLOW  YELLOW   APPearance CLEAR  CLEAR   Specific Gravity, Urine 1.025  1.005 - 1.030   pH 7.0  5.0 - 8.0   Glucose, UA NEGATIVE  NEGATIVE mg/dL   Hgb urine dipstick NEGATIVE  NEGATIVE   Bilirubin Urine NEGATIVE  NEGATIVE   Ketones, ur NEGATIVE  NEGATIVE mg/dL    Protein, ur NEGATIVE  NEGATIVE mg/dL   Urobilinogen, UA 0.2  0.0 - 1.0 mg/dL   Nitrite NEGATIVE  NEGATIVE   Leukocytes, UA NEGATIVE  NEGATIVE  HCG, SERUM, QUALITATIVE   Collection Time   08/13/12  9:20 AM      Component Value Range   Preg, Serum NEGATIVE  NEGATIVE  CBC   Collection Time   08/13/12  9:20 AM      Component Value Range   WBC 6.7  4.0 - 10.5 K/uL   RBC 4.62  3.87 - 5.11 MIL/uL   Hemoglobin 12.7  12.0 - 15.0 g/dL   HCT 16.1  09.6 - 04.5 %   MCV 83.1  78.0 - 100.0 fL   MCH 27.5  26.0 - 34.0 pg   MCHC 33.1  30.0 - 36.0 g/dL   RDW 40.9  81.1 - 91.4 %   Platelets 275  150 - 400 K/uL  COMPREHENSIVE METABOLIC PANEL   Collection Time   08/13/12  9:20 AM  Component Value Range   Sodium 140  135 - 145 mEq/L   Potassium 4.0  3.5 - 5.1 mEq/L   Chloride 106  96 - 112 mEq/L   CO2 26  19 - 32 mEq/L   Glucose, Bld 124 (*) 70 - 99 mg/dL   BUN 12  6 - 23 mg/dL   Creatinine, Ser 1.61  0.50 - 1.10 mg/dL   Calcium 9.5  8.4 - 09.6 mg/dL   Total Protein 6.4  6.0 - 8.3 g/dL   Albumin 3.4 (*) 3.5 - 5.2 g/dL   AST 17  0 - 37 U/L   ALT 22  0 - 35 U/L   Alkaline Phosphatase 61  39 - 117 U/L   Total Bilirubin 0.2 (*) 0.3 - 1.2 mg/dL   GFR calc non Af Amer >90  >90 mL/min   GFR calc Af Amer >90  >90 mL/min       Assessment/Plan: 1.  Multiparous female desires permanant sterilization  Patient understands the inherent failure rate of any and all sterilization procedures.  EURE,LUTHER H 08/19/2012, 8:31 AM

## 2012-08-19 NOTE — Transfer of Care (Signed)
Immediate Anesthesia Transfer of Care Note  Patient: Katrina Mitchell  Procedure(s) Performed: Procedure(s) (LRB): LAPAROSCOPIC TUBAL LIGATION (Bilateral)  Patient Location: PACU  Anesthesia Type: General  Level of Consciousness: awake  Airway & Oxygen Therapy: Patient Spontanous Breathing and non-rebreather face mask  Post-op Assessment: Report given to PACU RN, Post -op Vital signs reviewed and stable and Patient moving all extremities  Post vital signs: Reviewed and stable  Complications: No apparent anesthesia complications

## 2012-08-20 ENCOUNTER — Encounter (HOSPITAL_COMMUNITY): Payer: Self-pay | Admitting: Obstetrics & Gynecology

## 2012-08-27 NOTE — Op Note (Signed)
Preoperative Diagnosis:  Multiparous female desires permanent sterilization  Postoperative Diagnosis:  Same as above  Procedure:  Laparoscopic Bilateral Tubal Ligation by partail salpingectomy  Surgeon:  Rockne Coons MD  Anaesthesia:  LMA  Findings:  Patient had normal pelvic anatomy and no intraperitoneal abnormalities.  Description of Operation:  Patient was taken to the OR and placed into supine position where she underwent LMA anaesthesia.  She was placed in the dorsal lithotomy position and prepped and draped in the usual sterile fashion.  An incision was made in the umbilicus and dissection taken down to the rectus fascia which was incised and opened.  The non bladed trocar was then placed and the peritoneal cavity was insufflated.  The above noted findings were observed.  The Kleppinger electrocautery unit was employed but was not working.  I placed a 5 mm trocar in the LLQ and used the Harmonic scalpel and did a partial salpingectomy bilaterally, removing a 3 cm segment from both sides.  There was good hemostasis.  The fascia, peritoneum and subcutaneous tissue were closed using 0 vicryl.  The skin was closed using staples.  The patient was awakened from anaesthesia and taken to the PACU with all counts being correct x 3.  The patient received Ancef 2 gram and Toradol 30 mg IV preoperatively.  Lazaro Arms 08/19/2012

## 2012-09-18 ENCOUNTER — Emergency Department (HOSPITAL_COMMUNITY): Payer: Medicaid Other

## 2012-09-18 ENCOUNTER — Encounter (HOSPITAL_COMMUNITY): Payer: Self-pay | Admitting: *Deleted

## 2012-09-18 ENCOUNTER — Emergency Department (HOSPITAL_COMMUNITY)
Admission: EM | Admit: 2012-09-18 | Discharge: 2012-09-18 | Disposition: A | Discharge status: Home / Self Care | Payer: Medicaid Other | Attending: Emergency Medicine | Admitting: Emergency Medicine

## 2012-09-18 DIAGNOSIS — J45909 Unspecified asthma, uncomplicated: Secondary | ICD-10-CM | POA: Insufficient documentation

## 2012-09-18 DIAGNOSIS — Z79899 Other long term (current) drug therapy: Secondary | ICD-10-CM | POA: Insufficient documentation

## 2012-09-18 DIAGNOSIS — R079 Chest pain, unspecified: Secondary | ICD-10-CM | POA: Insufficient documentation

## 2012-09-18 DIAGNOSIS — K089 Disorder of teeth and supporting structures, unspecified: Secondary | ICD-10-CM | POA: Insufficient documentation

## 2012-09-18 DIAGNOSIS — K0889 Other specified disorders of teeth and supporting structures: Secondary | ICD-10-CM

## 2012-09-18 LAB — BASIC METABOLIC PANEL
CO2: 25 mEq/L (ref 19–32)
Chloride: 105 mEq/L (ref 96–112)
GFR calc non Af Amer: >90 mL/min (ref >90)
Glucose, Bld: 102 mg/dL — ABNORMAL HIGH (ref 70–99)
Potassium: 3.2 mEq/L — ABNORMAL LOW (ref 3.5–5.1)
Sodium: 139 mEq/L (ref 135–145)

## 2012-09-18 LAB — CBC WITH DIFFERENTIAL/PLATELET
Eosinophils Absolute: 0.9 10*3/uL — ABNORMAL HIGH (ref 0.0–0.7)
Lymphocytes Relative: 46 % (ref 12–46)
Lymphs Abs: 2.6 10*3/uL (ref 0.7–4.0)
Neutro Abs: 1.6 10*3/uL — ABNORMAL LOW (ref 1.7–7.7)
Neutrophils Relative %: 28 % — ABNORMAL LOW (ref 43–77)
Platelets: 254 10*3/uL (ref 150–400)
RBC: 4.21 MIL/uL (ref 3.87–5.11)
WBC: 5.7 10*3/uL (ref 4.0–10.5)

## 2012-09-18 LAB — D-DIMER, QUANTITATIVE: D-Dimer, Quant: 0.71 ug/mL-FEU — ABNORMAL HIGH (ref 0.00–0.48)

## 2012-09-18 MED ORDER — PREDNISONE 50 MG PO TABS: 50.0000 mg | ORAL_TABLET | Freq: Every day | ORAL | Status: DC

## 2012-09-18 MED ORDER — OXYCODONE-ACETAMINOPHEN 5-325 MG PO TABS: 1.0000 | ORAL_TABLET | ORAL | Status: AC | PRN

## 2012-09-18 MED ORDER — IOHEXOL 350 MG/ML SOLN
100.0000 mL | Freq: Once | INTRAVENOUS | Status: AC | PRN
  Administered 2012-09-18: 100 mL via INTRAVENOUS

## 2012-09-18 MED ORDER — ALBUTEROL SULFATE (5 MG/ML) 0.5% IN NEBU
2.5000 mg | INHALATION_SOLUTION | Freq: Once | RESPIRATORY_TRACT | Status: AC
  Administered 2012-09-18: 2.5 mg via RESPIRATORY_TRACT
  Filled 2012-09-18: qty 0.5

## 2012-09-18 MED ORDER — IPRATROPIUM BROMIDE 0.02 % IN SOLN
0.5000 mg | Freq: Once | RESPIRATORY_TRACT | Status: AC
  Administered 2012-09-18: 0.5 mg via RESPIRATORY_TRACT
  Filled 2012-09-18: qty 2.5

## 2012-09-18 MED ORDER — AMOXICILLIN 500 MG PO CAPS: 1000.0000 mg | ORAL_CAPSULE | Freq: Two times a day (BID) | ORAL | Status: DC

## 2012-09-18 MED ORDER — PREDNISONE 20 MG PO TABS
60.0000 mg | ORAL_TABLET | Freq: Once | ORAL | Status: AC
  Administered 2012-09-18: 60 mg via ORAL
  Filled 2012-09-18: qty 3

## 2012-09-18 NOTE — ED Provider Notes (Signed)
History    This chart was scribed for Dione Booze, MD, MD by Smitty Pluck. The patient was seen in room APA18 and the patient's care was started at 8:00AM.   CSN: [id number]  Arrival date & time 09/18/12  9562      Chief Complaint  Patient presents with  . Chest Pain  . Dental Pain    (Consider location/radiation/quality/duration/timing/severity/associated sxs/prior treatment) Patient is a 31 y.o. female presenting with chest pain and tooth pain. The history is provided by the patient. No language interpreter was used.  Chest Pain    Dental Pain  Katrina Mitchell is a 31 y.o. female with hx of asthma who presents to the Emergency Department complaining of intermittent, left parasternal chest pain onset 2 days ago. Pt reports that pain is aggravated by movement. She reports that she has intermittent,  sharp pain that radiates to back. She reports the pain is 4/10 at worst and currently 2/10. She has SOB, nausea and watery diarrhea. She denies fever, chills, diaphoresis and cough. She reports having oral abscess and moderate dental pain for 1 year. Denies smoking. Pt is not breastfeeding.   Past Medical History  Diagnosis Date  . Asthma   . Pregnancy as incidental finding   . Complication of anesthesia   . GERD (gastroesophageal reflux disease)     Past Surgical History  Procedure Date  . Nasal sinus surgery   . Cholecystectomy 2001  . Lypoma removal 2003    right arm   . Laparoscopic tubal ligation 08/19/2012    Procedure: LAPAROSCOPIC TUBAL LIGATION;  Surgeon: Lazaro Arms, MD;  Location: [facility];  Service: Gynecology;  Laterality: Bilateral;    No family history on file.  History  Substance Use Topics  . Smoking status: Former Smoker -- 1.0 packs/day for 2 years    Types: Cigarettes  . Smokeless tobacco: Never Used  . Alcohol Use: No     weekly    OB History    Grav Para Term Preterm Abortions TAB SAB Ect Mult Living   3 3 2 1  0 0 0 0 0 3      Review of  Systems  Cardiovascular: Positive for chest pain.  All other systems reviewed and are negative.    Allergies  Review of patient's allergies indicates no known allergies.  Home Medications   Current Outpatient Rx  Name Route Sig Dispense Refill  . ALBUTEROL SULFATE HFA 108 (90 BASE) MCG/ACT IN AERS Inhalation Inhale 1-2 puffs into the lungs every 6 (six) hours as needed for wheezing. 1 Inhaler 0  . ALBUTEROL SULFATE (2.5 MG/3ML) 0.083% IN NEBU Nebulization Take 3 mLs (2.5 mg total) by nebulization every 6 (six) hours as needed for wheezing. 75 mL 12  . CIPROFLOXACIN HCL 500 MG PO TABS Oral Take 500 mg by mouth 2 (two) times daily.    Marland Kitchen CLINDAMYCIN HCL 300 MG PO CAPS Oral Take 300 mg by mouth 3 (three) times daily.    Marland Kitchen FLUTICASONE-SALMETEROL 250-50 MCG/DOSE IN AEPB Inhalation Inhale 1 puff into the lungs 2 (two) times daily. 60 each 0  . IPRATROPIUM-ALBUTEROL 0.5-2.5 (3) MG/3ML IN SOLN Nebulization Take 3 mLs by nebulization 2 (two) times daily. 75 mL 0  . LORATADINE 10 MG PO TABS Oral Take 10 mg by mouth daily.    Marland Kitchen OMEPRAZOLE 20 MG PO CPDR Oral Take 20 mg by mouth daily.    Marland Kitchen OVER THE COUNTER MEDICATION Oral Take 2 tablets by mouth daily. Patient  takes gummi prenatal vitamins    . PREDNISONE 10 MG PO TABS Oral Take 40 mg by mouth daily.      BP 147/96  Pulse 87  Temp 98.8 F (37.1 C) (Oral)  Resp 20  Ht 5\' 7"  (1.702 m)  Wt 193 lb (87.544 kg)  BMI 30.23 kg/m2  SpO2 98%  LMP 08/27/2012  Breastfeeding? No  Physical Exam  Nursing note and vitals reviewed. Constitutional: She is oriented to person, place, and time. She appears well-developed and well-nourished. No distress.  HENT:  Head: Normocephalic and atraumatic.       Poor dentition with multiple dental caries  Abscess gingiva tooth 18    Eyes: EOM are normal. Pupils are equal, round, and reactive to light.  Neck: Normal range of motion. Neck supple. No tracheal deviation present.  Cardiovascular: Normal rate, regular  rhythm and normal heart sounds.   Pulmonary/Chest: Effort normal. No respiratory distress.       prolonged expiration phase with scattered wheezes Mild left parasternal chest pain  Abdominal: Soft. She exhibits no distension.  Musculoskeletal: Normal range of motion.  Neurological: She is alert and oriented to person, place, and time.  Skin: Skin is warm and dry.  Psychiatric: She has a normal mood and affect. Her behavior is normal.    ED Course  Procedures (including critical care time) DIAGNOSTIC STUDIES: Oxygen Saturation is 98% on room air, normal by my interpretation.    COORDINATION OF CARE: 8:15 AM Discussed ED treatment with pt  8:15 AM Ordered:   Medications  lansoprazole (PREVACID) 30 MG capsule (not administered)  albuterol (PROVENTIL) (5 MG/ML) 0.5% nebulizer solution 2.5 mg (2.5 mg Nebulization Given 09/18/12 0817)  ipratropium (ATROVENT) nebulizer solution 0.5 mg (0.5 mg Nebulization Given 09/18/12 0817)       Labs Reviewed  D-DIMER, QUANTITATIVE - Abnormal; Notable for the following:    D-Dimer, Quant 0.71 (*)     All other components within normal limits  CBC WITH DIFFERENTIAL - Abnormal; Notable for the following:    Hemoglobin 11.5 (*)     HCT 33.8 (*)     Neutrophils Relative 28 (*)     Neutro Abs 1.6 (*)     Eosinophils Relative 17 (*)     Eosinophils Absolute 0.9 (*)     All other components within normal limits  BASIC METABOLIC PANEL - Abnormal; Notable for the following:    Potassium 3.2 (*)     Glucose, Bld 102 (*)     All other components within normal limits   Dg Chest 2 View  09/18/2012  *RADIOLOGY REPORT*  Clinical Data: Left chest pain for 3 days, shortness of breath, history asthma, GERD  CHEST - 2 VIEW  Comparison: 07/25/2012  Findings: Normal heart size, mediastinal contours, and pulmonary vascularity. Minimal chronic peribronchial thickening. No pulmonary infiltrate, pleural effusion or pneumothorax. Bones unremarkable.  IMPRESSION:  Minimal chronic peribronchial thickening which could be related to bronchitis or asthma. No acute abnormalities.   Original Report Authenticated By: Lollie Marrow, M.D.    Ct Angio Chest W/cm &/or Wo Cm  09/18/2012  *RADIOLOGY REPORT*  Clinical Data: Chest pain.  CT ANGIOGRAPHY CHEST  Technique:  Multidetector CT imaging of the chest using the standard protocol during bolus administration of intravenous contrast. Multiplanar reconstructed images including MIPs were obtained and reviewed to evaluate the vascular anatomy.  Contrast: OMNIPAQUE IOHEXOL 350 MG/ML SOLN  Comparison: None.  Findings: No filling defects in the pulmonary arteries to suggest  wall pulmonary emboli. Heart is normal size. Aorta is normal caliber. No mediastinal, hilar, or axillary adenopathy.  Visualized thyroid and chest wall soft tissues unremarkable.  Small hiatal hernia. Lungs are clear.  No focal airspace opacities or suspicious nodules.  No effusions. Imaging into the upper abdomen shows no acute findings.  No acute bony abnormality.  IMPRESSION: No evidence of pulmonary embolus.  Small hiatal hernia.  No acute findings.   Original Report Authenticated By: Cyndie Chime, M.D.     ECG shows normal sinus rhythm with a rate of 83, no ectopy. Normal axis. Normal P wave. Normal QRS. Normal intervals. Normal ST and T waves. Impression: normal ECG. No prior ECG available for comparison.   1. Chest pain   2. Pain, dental   3. Asthma       MDM  Chest pain and dyspnea which seems most consistent with exacerbation of asthma. ECG shows no evidence of ischemia, and she doesn't show evidence of bronchospasm on exam. Chest x-ray will be obtained and she will be given a trial of albuterol with Atrovent. Dental pain related to gingivitis an abscess we'll need to be treated with antibiotics and she will need to follow up with a dentist.  0900: After albuterol with Atrovent, she notes no subjective difference although, on  auscultation, there is improved airflow with diminished wheezing. She is 11 weeks postpartum, so a d-dimer will be obtained to rule out pulmonary embolism. If negative, she will be treated with a burst of steroids and encouraged to use her albuterol inhaler.  D-dimer has come up positive. CT angiogram is negative. She is discharged with prescriptions for prednisone, amoxicillin, and advised to use her inhaler as needed.  I personally performed the services described in this documentation, which was scribed in my presence. The recorded information has been reviewed and considered.      Dione Booze, MD 09/18/12 1224

## 2012-09-18 NOTE — ED Notes (Signed)
Intermittent left sided cp x 3 days with diarrhea.  Reports pain is worse with movement and worse with deep breath.  Denies n/v/cough.  Reports used inhaler with no relief.  Also c/o mild dizziness.  C/o dental abscesses with no abx treatment.

## 2012-11-10 ENCOUNTER — Emergency Department (HOSPITAL_COMMUNITY): Payer: Medicaid Other

## 2012-11-10 ENCOUNTER — Emergency Department (HOSPITAL_COMMUNITY)
Admission: EM | Admit: 2012-11-10 | Discharge: 2012-11-10 | Disposition: A | Discharge status: Home / Self Care | Payer: Medicaid Other | Attending: Emergency Medicine | Admitting: Emergency Medicine

## 2012-11-10 ENCOUNTER — Encounter (HOSPITAL_COMMUNITY): Payer: Self-pay

## 2012-11-10 DIAGNOSIS — Z8719 Personal history of other diseases of the digestive system: Secondary | ICD-10-CM | POA: Insufficient documentation

## 2012-11-10 DIAGNOSIS — J45901 Unspecified asthma with (acute) exacerbation: Secondary | ICD-10-CM | POA: Insufficient documentation

## 2012-11-10 DIAGNOSIS — Z79899 Other long term (current) drug therapy: Secondary | ICD-10-CM | POA: Insufficient documentation

## 2012-11-10 DIAGNOSIS — Z331 Pregnant state, incidental: Secondary | ICD-10-CM | POA: Insufficient documentation

## 2012-11-10 DIAGNOSIS — Z9889 Other specified postprocedural states: Secondary | ICD-10-CM | POA: Insufficient documentation

## 2012-11-10 DIAGNOSIS — J45909 Unspecified asthma, uncomplicated: Secondary | ICD-10-CM

## 2012-11-10 DIAGNOSIS — Z87891 Personal history of nicotine dependence: Secondary | ICD-10-CM | POA: Insufficient documentation

## 2012-11-10 LAB — COMPREHENSIVE METABOLIC PANEL
ALT: 20 U/L (ref 0–35)
AST: 26 U/L (ref 0–37)
Alkaline Phosphatase: 70 U/L (ref 39–117)
CO2: 25 mEq/L (ref 19–32)
Chloride: 104 mEq/L (ref 96–112)
GFR calc non Af Amer: >90 mL/min (ref >90)
Sodium: 142 mEq/L (ref 135–145)
Total Bilirubin: 0.3 mg/dL (ref 0.3–1.2)

## 2012-11-10 LAB — CBC WITH DIFFERENTIAL/PLATELET
Basophils Relative: 3 % — ABNORMAL HIGH (ref 0–1)
Eosinophils Relative: 34 % — ABNORMAL HIGH (ref 0–5)
Hemoglobin: 14.4 g/dL (ref 12.0–15.0)
Lymphocytes Relative: 26 % (ref 12–46)
MCH: 27.3 pg (ref 26.0–34.0)
Monocytes Absolute: 0.7 10*3/uL (ref 0.1–1.0)
Neutrophils Relative %: 29 % — ABNORMAL LOW (ref 43–77)
RBC: 5.27 MIL/uL — ABNORMAL HIGH (ref 3.87–5.11)

## 2012-11-10 MED ORDER — IPRATROPIUM BROMIDE 0.02 % IN SOLN
0.5000 mg | Freq: Once | RESPIRATORY_TRACT | Status: AC
  Administered 2012-11-10: 0.5 mg via RESPIRATORY_TRACT
  Filled 2012-11-10: qty 2.5

## 2012-11-10 MED ORDER — ALBUTEROL SULFATE (5 MG/ML) 0.5% IN NEBU
5.0000 mg | INHALATION_SOLUTION | Freq: Once | RESPIRATORY_TRACT | Status: AC
  Administered 2012-11-10: 5 mg via RESPIRATORY_TRACT
  Filled 2012-11-10: qty 1

## 2012-11-10 MED ORDER — IPRATROPIUM BROMIDE HFA 17 MCG/ACT IN AERS: 2.0000 | INHALATION_SPRAY | Freq: Four times a day (QID) | RESPIRATORY_TRACT | Status: DC

## 2012-11-10 MED ORDER — POTASSIUM CHLORIDE CRYS ER 20 MEQ PO TBCR
20.0000 meq | EXTENDED_RELEASE_TABLET | Freq: Once | ORAL | Status: AC
  Administered 2012-11-10: 20 meq via ORAL
  Filled 2012-11-10: qty 1

## 2012-11-10 MED ORDER — PREDNISONE 10 MG PO TABS: 20.0000 mg | ORAL_TABLET | Freq: Every day | ORAL | Status: DC

## 2012-11-10 MED ORDER — METHYLPREDNISOLONE SODIUM SUCC 125 MG IJ SOLR
125.0000 mg | Freq: Once | INTRAMUSCULAR | Status: AC
  Administered 2012-11-10: 125 mg via INTRAVENOUS
  Filled 2012-11-10: qty 2

## 2012-11-10 MED ORDER — LORAZEPAM 2 MG/ML IJ SOLN
1.0000 mg | Freq: Once | INTRAMUSCULAR | Status: AC
  Administered 2012-11-10: 1 mg via INTRAVENOUS
  Filled 2012-11-10: qty 1

## 2012-11-10 NOTE — ED Notes (Signed)
Complain of being SOB and having an anxiety attack. Pt hyperventilating

## 2012-11-10 NOTE — ED Provider Notes (Signed)
History   This chart was scribed for Benny Lennert, MD by Leone Payor, ED Scribe. This patient was seen in room APA18/AP[facility]18 and the patient's care was started at 1409.   CSN: [id number]  Arrival date & time 11/10/12  1402   First MD Initiated Contact with Patient 11/10/12 1409      Chief Complaint  Patient presents with  . Shortness of Breath    Patient is a 31 y.o. female presenting with shortness of breath. The history is provided by the patient. No language interpreter was used.  Shortness of Breath  The current episode started 2 days ago. The onset was sudden. The problem occurs frequently. The problem has been gradually worsening. The problem is moderate. The symptoms are relieved by one or more prescription drugs. Associated symptoms include cough, shortness of breath and wheezing. Pertinent negatives include no chest pain and no fever. The cough is productive. Nothing relieves the cough. Nothing worsens the cough.    Pt is a former smoker but denies alcohol use.  Past Medical History  Diagnosis Date  . Asthma   . Pregnancy as incidental finding   . Complication of anesthesia   . GERD (gastroesophageal reflux disease)     Past Surgical History  Procedure Date  . Nasal sinus surgery   . Cholecystectomy 2001  . Lypoma removal 2003    right arm   . Laparoscopic tubal ligation 08/19/2012    Procedure: LAPAROSCOPIC TUBAL LIGATION;  Surgeon: Lazaro Arms, MD;  Location: [facility];  Service: Gynecology;  Laterality: Bilateral;    No family history on file.  History  Substance Use Topics  . Smoking status: Former Smoker -- 1.0 packs/day for 2 years    Types: Cigarettes  . Smokeless tobacco: Never Used  . Alcohol Use: No     Comment: weekly    OB History    Grav Para Term Preterm Abortions TAB SAB Ect Mult Living   3 3 2 1  0 0 0 0 0 3      Review of Systems  Constitutional: Negative for fever, chills and fatigue.  HENT: Negative for congestion, sinus pressure and  ear discharge.   Eyes: Negative for discharge.  Respiratory: Positive for cough, shortness of breath and wheezing.   Cardiovascular: Negative for chest pain.  Gastrointestinal: Negative for abdominal pain and diarrhea.  Genitourinary: Negative for frequency and hematuria.  Musculoskeletal: Negative for back pain.  Skin: Negative for rash.  Neurological: Negative for seizures and headaches.  Hematological: Negative.   Psychiatric/Behavioral: Negative for hallucinations.    Allergies  Review of patient's allergies indicates no known allergies.  Home Medications   Current Outpatient Rx  Name  Route  Sig  Dispense  Refill  . ALBUTEROL SULFATE HFA 108 (90 BASE) MCG/ACT IN AERS   Inhalation   Inhale 1-2 puffs into the lungs every 6 (six) hours as needed for wheezing.   1 Inhaler   0   . ALBUTEROL SULFATE (2.5 MG/3ML) 0.083% IN NEBU   Nebulization   Take 3 mLs (2.5 mg total) by nebulization every 6 (six) hours as needed for wheezing.   75 mL   12   . AMOXICILLIN 500 MG PO CAPS   Oral   Take 2 capsules (1,000 mg total) by mouth 2 (two) times daily.   40 capsule   0   . FLUTICASONE-SALMETEROL 250-50 MCG/DOSE IN AEPB   Inhalation   Inhale 1 puff into the lungs 2 (two) times daily.  60 each   0   . IPRATROPIUM-ALBUTEROL 0.5-2.5 (3) MG/3ML IN SOLN   Nebulization   Take 3 mLs by nebulization 2 (two) times daily.   75 mL   0   . LANSOPRAZOLE 30 MG PO CPDR   Oral   Take 30 mg by mouth daily.         Marland Kitchen LORATADINE 10 MG PO TABS   Oral   Take 10 mg by mouth daily.         Marland Kitchen PREDNISONE 50 MG PO TABS   Oral   Take 1 tablet (50 mg total) by mouth daily.   5 tablet   0     BP 136/79  Resp 36  SpO2 92%  Physical Exam  Nursing note and vitals reviewed. Constitutional: She is oriented to person, place, and time. She appears well-developed.  HENT:  Head: Normocephalic and atraumatic.  Eyes: Conjunctivae normal and EOM are normal. No scleral icterus.  Neck: Neck  supple. No thyromegaly present.  Cardiovascular: Normal rate, regular rhythm and normal heart sounds.  Exam reveals no gallop and no friction rub.   No murmur heard. Pulmonary/Chest: Effort normal. No stridor. She has wheezes. She has no rales. She exhibits no tenderness.       Moderate wheezes bilaterally.   Abdominal: Soft. Bowel sounds are normal. She exhibits no distension. There is no tenderness. There is no rebound.  Musculoskeletal: Normal range of motion. She exhibits no edema.  Lymphadenopathy:    She has no cervical adenopathy.  Neurological: She is oriented to person, place, and time. Coordination normal.  Skin: No rash noted. No erythema.  Psychiatric: She has a normal mood and affect. Her behavior is normal.       Has anxiety    ED Course  Procedures (including critical care time)  DIAGNOSTIC STUDIES: Oxygen Saturation is 92% on room air, low by my interpretation.    COORDINATION OF CARE:   2:20 PM Discussed treatment plan which includes breathing treatment with pt at bedside and pt agreed to plan.   Labs Reviewed  CBC WITH DIFFERENTIAL - Abnormal; Notable for the following:    RBC 5.27 (*)     Neutrophils Relative 29 (*)     Eosinophils Relative 34 (*)     Basophils Relative 3 (*)     Eosinophils Absolute 3.1 (*)     Basophils Absolute 0.3 (*)     All other components within normal limits  COMPREHENSIVE METABOLIC PANEL - Abnormal; Notable for the following:    Potassium 2.8 (*)     Glucose, Bld 105 (*)     BUN 5 (*)     All other components within normal limits   Dg Chest Portable 1 View  11/10/2012  *RADIOLOGY REPORT*  Clinical Data: Shortness of breath, asthma.  PORTABLE CHEST - 1 VIEW  Comparison: 10/08/2012  Findings: Mild peribronchial thickening. Heart and mediastinal contours are within normal limits.  No focal opacities or effusions.  No acute bony abnormality.  IMPRESSION: Mild bronchitic changes.   Original Report Authenticated By: Charlett Nose, M.D.       No diagnosis found.    MDM        The chart was scribed for me under my direct supervision.  I personally performed the history, physical, and medical decision making and all procedures in the evaluation of this patient.Benny Lennert, MD 11/10/12 [phone]

## 2012-11-14 ENCOUNTER — Encounter (HOSPITAL_COMMUNITY): Payer: Self-pay | Admitting: Emergency Medicine

## 2012-11-14 ENCOUNTER — Emergency Department (HOSPITAL_COMMUNITY): Payer: Medicaid Other

## 2012-11-14 ENCOUNTER — Emergency Department (HOSPITAL_COMMUNITY)
Admission: EM | Admit: 2012-11-14 | Discharge: 2012-11-14 | Disposition: A | Discharge status: Home / Self Care | Payer: Medicaid Other | Attending: Emergency Medicine | Admitting: Emergency Medicine

## 2012-11-14 DIAGNOSIS — J45909 Unspecified asthma, uncomplicated: Secondary | ICD-10-CM

## 2012-11-14 DIAGNOSIS — Z79899 Other long term (current) drug therapy: Secondary | ICD-10-CM | POA: Insufficient documentation

## 2012-11-14 DIAGNOSIS — J45901 Unspecified asthma with (acute) exacerbation: Secondary | ICD-10-CM | POA: Insufficient documentation

## 2012-11-14 DIAGNOSIS — Z87891 Personal history of nicotine dependence: Secondary | ICD-10-CM | POA: Insufficient documentation

## 2012-11-14 DIAGNOSIS — K219 Gastro-esophageal reflux disease without esophagitis: Secondary | ICD-10-CM | POA: Insufficient documentation

## 2012-11-14 MED ORDER — ALBUTEROL SULFATE (5 MG/ML) 0.5% IN NEBU
5.0000 mg | INHALATION_SOLUTION | Freq: Once | RESPIRATORY_TRACT | Status: AC
  Administered 2012-11-14: 5 mg via RESPIRATORY_TRACT
  Filled 2012-11-14: qty 1

## 2012-11-14 MED ORDER — PREDNISONE 20 MG PO TABS: 60.0000 mg | ORAL_TABLET | Freq: Every day | ORAL | Status: DC

## 2012-11-14 MED ORDER — PREDNISONE 50 MG PO TABS
60.0000 mg | ORAL_TABLET | Freq: Once | ORAL | Status: AC
  Administered 2012-11-14: 60 mg via ORAL
  Filled 2012-11-14: qty 1

## 2012-11-14 MED ORDER — IPRATROPIUM BROMIDE 0.02 % IN SOLN
0.5000 mg | Freq: Once | RESPIRATORY_TRACT | Status: AC
  Administered 2012-11-14: 0.5 mg via RESPIRATORY_TRACT
  Filled 2012-11-14: qty 2.5

## 2012-11-14 NOTE — ED Provider Notes (Signed)
History  This chart was scribed for Rorik Vespa B. Bernette Mayers, MD by Erskine Emery, ED Scribe. This patient was seen in room APA14/AP[facility]14 and the patient's care was started at 07:29.   CSN: [id number]  Arrival date & time 11/14/12  0725   First MD Initiated Contact with Patient 11/14/12 [phone]      No chief complaint on file.   (Consider location/radiation/quality/duration/timing/severity/associated sxs/prior treatment) The history is provided by the patient. No language interpreter was used.  Katrina Mitchell is a 31 y.o. female who presents to the Emergency Department complaining of cough and asthma exacerbation for the past week. Pt reports some associated dizziness and chest soreness but denies any fevers or significant chest pain. Pt was seen here 4 days ago for the same complaint, she had labs done, a chest x-ray, and was put on 10 mg prednisone, x2/day, of which she has 4.5 days left. Pt has not yet taken her prednisone today. Pt has been using her albuterol inhaler (about every 20-30 minutes) and a duo nebulizer at home and at work, with only temporary relief from symptoms. Pt was prescribed an inhaler with atrovent in it but she could afford it, nor can she afford to see a primary care physician because she has no insurance untill January. Pt has ben hospitalized once before for her asthma, but she has never been intubated.  Past Medical History  Diagnosis Date  . Asthma   . Pregnancy as incidental finding   . Complication of anesthesia   . GERD (gastroesophageal reflux disease)     Past Surgical History  Procedure Date  . Nasal sinus surgery   . Cholecystectomy 2001  . Lypoma removal 2003    right arm   . Laparoscopic tubal ligation 08/19/2012    Procedure: LAPAROSCOPIC TUBAL LIGATION;  Surgeon: Lazaro Arms, MD;  Location: [facility];  Service: Gynecology;  Laterality: Bilateral;    No family history on file.  History  Substance Use Topics  . Smoking status: Former Smoker -- 1.0  packs/day for 2 years    Types: Cigarettes  . Smokeless tobacco: Never Used  . Alcohol Use: No     Comment: weekly    OB History    Grav Para Term Preterm Abortions TAB SAB Ect Mult Living   3 3 2 1  0 0 0 0 0 3      Review of Systems A complete 10 system review of systems was obtained and all systems are negative except as noted in the HPI and PMH.    Allergies  Review of patient's allergies indicates no known allergies.  Home Medications   Current Outpatient Rx  Name  Route  Sig  Dispense  Refill  . ALBUTEROL SULFATE HFA 108 (90 BASE) MCG/ACT IN AERS   Inhalation   Inhale 1-2 puffs into the lungs every 6 (six) hours as needed for wheezing.   1 Inhaler   0   . ALBUTEROL SULFATE (2.5 MG/3ML) 0.083% IN NEBU   Nebulization   Take 3 mLs (2.5 mg total) by nebulization every 6 (six) hours as needed for wheezing.   75 mL   12   . AMOXICILLIN 500 MG PO CAPS   Oral   Take 2 capsules (1,000 mg total) by mouth 2 (two) times daily.   40 capsule   0   . CETIRIZINE HCL 10 MG PO TABS   Oral   Take 10 mg by mouth daily.         Marland Kitchen  FLUTICASONE-SALMETEROL 250-50 MCG/DOSE IN AEPB   Inhalation   Inhale 1 puff into the lungs 2 (two) times daily.   60 each   0   . IPRATROPIUM BROMIDE HFA 17 MCG/ACT IN AERS   Inhalation   Inhale 2 puffs into the lungs every 6 (six) hours.   1 Inhaler   12   . IPRATROPIUM-ALBUTEROL 0.5-2.5 (3) MG/3ML IN SOLN   Nebulization   Take 3 mLs by nebulization 2 (two) times daily.   75 mL   0   . LANSOPRAZOLE 30 MG PO CPDR   Oral   Take 30 mg by mouth daily.         Marland Kitchen PREDNISONE 10 MG PO TABS   Oral   Take 2 tablets (20 mg total) by mouth daily.   15 tablet   0     LMP 10/25/2012  Physical Exam  Nursing note and vitals reviewed. Constitutional: She is oriented to person, place, and time. She appears well-developed and well-nourished.  HENT:  Head: Normocephalic and atraumatic.  Eyes: EOM are normal. Pupils are equal, round, and  reactive to light.  Neck: Normal range of motion. Neck supple.  Cardiovascular: Normal rate, normal heart sounds and intact distal pulses.   Pulmonary/Chest: Effort normal. She has wheezes.       Wheezing along all fields.  Abdominal: Bowel sounds are normal. She exhibits no distension. There is no tenderness.  Musculoskeletal: Normal range of motion. She exhibits no edema and no tenderness.  Neurological: She is alert and oriented to person, place, and time. She has normal strength. No cranial nerve deficit or sensory deficit.  Skin: Skin is warm and dry. No rash noted.  Psychiatric: She has a normal mood and affect.    ED Course  Procedures (including critical care time) DIAGNOSTIC STUDIES: Oxygen Saturation is 98% on room air, normal by my interpretation.    COORDINATION OF CARE: 07:35--I evaluated the patient and we discussed a treatment plan including nebulizer, chest x-ray, and prednisone dosage increase (60mg ) to which the pt agreed.   Dg Chest 2 View  11/14/2012  *RADIOLOGY REPORT*  Clinical Data: Cough and wheezing.  CHEST - 2 VIEW  Comparison: 11/10/2012.  Findings: The cardiac silhouette, mediastinal and hilar contours are normal and stable.  The lungs are clear.  Minimal bronchitic changes, likely related to smoking.  No infiltrates, edema or effusions.  IMPRESSION: No acute cardiopulmonary findings.   Original Report Authenticated By: Rudie Meyer, M.D.    Dg Chest Portable 1 View  11/10/2012  *RADIOLOGY REPORT*  Clinical Data: Shortness of breath, asthma.  PORTABLE CHEST - 1 VIEW  Comparison: 10/08/2012  Findings: Mild peribronchial thickening. Heart and mediastinal contours are within normal limits.  No focal opacities or effusions.  No acute bony abnormality.  IMPRESSION: Mild bronchitic changes.   Original Report Authenticated By: Charlett Nose, M.D.     No diagnosis found.    MDM   8:58 AM Pt feeling better after neb, CXR clear. Will increased prednisone to 60mg   daily (was given 20mg  daily at previous ED visit). PCP followup.      I personally performed the services described in this documentation, which was scribed in my presence. The recorded information has been reviewed and is accurate.     Keerat Denicola B. Bernette Mayers, MD 11/14/12 [phone]

## 2012-11-14 NOTE — ED Notes (Signed)
Patient with c/o asthma exacerbation x 1 weeks. Seen for same four days ago. Last duo neb at 0230 today per patient. Hoarse, frequent coughing noted.

## 2012-12-05 ENCOUNTER — Encounter (HOSPITAL_COMMUNITY): Payer: Self-pay | Admitting: Emergency Medicine

## 2012-12-05 ENCOUNTER — Emergency Department (HOSPITAL_COMMUNITY): Payer: Medicaid Other

## 2012-12-05 ENCOUNTER — Inpatient Hospital Stay (HOSPITAL_COMMUNITY)
Admission: EM | Admit: 2012-12-05 | Discharge: 2012-12-08 | DRG: 208 | Disposition: A | Discharge status: Home / Self Care | Payer: Medicaid Other | Attending: Pulmonary Disease | Admitting: Pulmonary Disease

## 2012-12-05 DIAGNOSIS — J9600 Acute respiratory failure, unspecified whether with hypoxia or hypercapnia: Secondary | ICD-10-CM | POA: Diagnosis present

## 2012-12-05 DIAGNOSIS — J209 Acute bronchitis, unspecified: Secondary | ICD-10-CM | POA: Diagnosis present

## 2012-12-05 DIAGNOSIS — Z23 Encounter for immunization: Secondary | ICD-10-CM

## 2012-12-05 DIAGNOSIS — Z79899 Other long term (current) drug therapy: Secondary | ICD-10-CM

## 2012-12-05 DIAGNOSIS — J45902 Unspecified asthma with status asthmaticus: Principal | ICD-10-CM | POA: Diagnosis present

## 2012-12-05 DIAGNOSIS — G9340 Encephalopathy, unspecified: Secondary | ICD-10-CM | POA: Diagnosis present

## 2012-12-05 DIAGNOSIS — Z87891 Personal history of nicotine dependence: Secondary | ICD-10-CM

## 2012-12-05 DIAGNOSIS — Z302 Encounter for sterilization: Secondary | ICD-10-CM

## 2012-12-05 DIAGNOSIS — Z9089 Acquired absence of other organs: Secondary | ICD-10-CM

## 2012-12-05 DIAGNOSIS — J9690 Respiratory failure, unspecified, unspecified whether with hypoxia or hypercapnia: Secondary | ICD-10-CM

## 2012-12-05 DIAGNOSIS — K029 Dental caries, unspecified: Secondary | ICD-10-CM | POA: Diagnosis present

## 2012-12-05 DIAGNOSIS — I498 Other specified cardiac arrhythmias: Secondary | ICD-10-CM | POA: Diagnosis present

## 2012-12-05 DIAGNOSIS — J45901 Unspecified asthma with (acute) exacerbation: Secondary | ICD-10-CM

## 2012-12-05 DIAGNOSIS — K219 Gastro-esophageal reflux disease without esophagitis: Secondary | ICD-10-CM | POA: Diagnosis present

## 2012-12-05 LAB — URINALYSIS, ROUTINE W REFLEX MICROSCOPIC
Bilirubin Urine: NEGATIVE
Glucose, UA: NEGATIVE mg/dL
Hgb urine dipstick: NEGATIVE
Ketones, ur: NEGATIVE mg/dL
Ketones, ur: NEGATIVE mg/dL
Leukocytes, UA: NEGATIVE
Leukocytes, UA: NEGATIVE
Nitrite: NEGATIVE
Nitrite: NEGATIVE
Protein, ur: NEGATIVE mg/dL
Protein, ur: NEGATIVE mg/dL
Specific Gravity, Urine: 1.02 (ref 1.005–1.030)
Urobilinogen, UA: 0.2 mg/dL (ref 0.0–1.0)
Urobilinogen, UA: 0.2 mg/dL (ref 0.0–1.0)
pH: 6.5 (ref 5.0–8.0)

## 2012-12-05 LAB — BLOOD GAS, ARTERIAL
Acid-base deficit: 5.4 mmol/L — ABNORMAL HIGH (ref 0.0–2.0)
Acid-base deficit: 3.9 mmol/L — ABNORMAL HIGH (ref 0.0–2.0)
Acid-base deficit: 3.4 mmol/L — ABNORMAL HIGH (ref 0.0–2.0)
Bicarbonate: 21.5 mEq/L (ref 20.0–24.0)
FIO2: 100 %
FIO2: 0.5 %
MECHVT: 500 mL
O2 Saturation: 97.7 %
Patient temperature: 98.6
RATE: 12 resp/min
RATE: 16 resp/min
TCO2: 22.8 mmol/L (ref 0–100)
pCO2 arterial: 59.8 mmHg (ref 35.0–45.0)
pCO2 arterial: 49.9 mmHg — ABNORMAL HIGH (ref 35.0–45.0)
pH, Arterial: 7.184 — CL (ref 7.350–7.450)
pH, Arterial: 7.266 — ABNORMAL LOW (ref 7.350–7.450)
pO2, Arterial: 143 mmHg — ABNORMAL HIGH (ref 80.0–100.0)

## 2012-12-05 LAB — CBC
HCT: 41.7 % (ref 36.0–46.0)
Hemoglobin: 14.3 g/dL (ref 12.0–15.0)
MCH: 26.9 pg (ref 26.0–34.0)
MCHC: 34.3 g/dL (ref 30.0–36.0)
MCV: 78.4 fL (ref 78.0–100.0)
Platelets: 319 10*3/uL (ref 150–400)
RBC: 5.32 MIL/uL — ABNORMAL HIGH (ref 3.87–5.11)
RDW: 13.5 % (ref 11.5–15.5)
WBC: 10.2 10*3/uL (ref 4.0–10.5)

## 2012-12-05 LAB — MRSA PCR SCREENING: MRSA by PCR: NEGATIVE

## 2012-12-05 LAB — BASIC METABOLIC PANEL
BUN: 7 mg/dL (ref 6–23)
CO2: 25 mEq/L (ref 19–32)
Calcium: 9.2 mg/dL (ref 8.4–10.5)
Chloride: 104 mEq/L (ref 96–112)
Creatinine, Ser: 0.56 mg/dL (ref 0.50–1.10)
GFR calc Af Amer: >90 mL/min (ref >90)
GFR calc non Af Amer: >90 mL/min (ref >90)
Glucose, Bld: 88 mg/dL (ref 70–99)
Potassium: 3.5 mEq/L (ref 3.5–5.1)
Sodium: 138 mEq/L (ref 135–145)

## 2012-12-05 LAB — URINE MICROSCOPIC-ADD ON

## 2012-12-05 LAB — PREGNANCY, URINE: Preg Test, Ur: NEGATIVE

## 2012-12-05 MED ORDER — VECURONIUM BROMIDE 10 MG IV SOLR
INTRAVENOUS | Status: AC
  Filled 2012-12-05: qty 40

## 2012-12-05 MED ORDER — FENTANYL CITRATE 0.05 MG/ML IJ SOLN
50.0000 ug | INTRAMUSCULAR | Status: DC | PRN
  Administered 2012-12-05 – 2012-12-06 (×4): 100 ug via INTRAVENOUS
  Filled 2012-12-05 (×4): qty 2

## 2012-12-05 MED ORDER — PANTOPRAZOLE SODIUM 40 MG IV SOLR
40.0000 mg | Freq: Every day | INTRAVENOUS | Status: DC
  Administered 2012-12-05: 40 mg via INTRAVENOUS
  Filled 2012-12-05 (×3): qty 40

## 2012-12-05 MED ORDER — SODIUM CHLORIDE 0.9 % IV SOLN: 250.0000 mL | INTRAVENOUS | Status: DC | PRN

## 2012-12-05 MED ORDER — LIDOCAINE HCL (CARDIAC) 20 MG/ML IV SOLN
INTRAVENOUS | Status: AC
  Filled 2012-12-05: qty 5

## 2012-12-05 MED ORDER — EPINEPHRINE HCL 1 MG/ML IJ SOLN
INTRAMUSCULAR | Status: AC
  Filled 2012-12-05: qty 1

## 2012-12-05 MED ORDER — ETOMIDATE 2 MG/ML IV SOLN
INTRAVENOUS | Status: DC | PRN
  Administered 2012-12-05: 20 mg via INTRAVENOUS

## 2012-12-05 MED ORDER — ETOMIDATE 2 MG/ML IV SOLN
INTRAVENOUS | Status: AC
  Filled 2012-12-05: qty 20

## 2012-12-05 MED ORDER — IPRATROPIUM BROMIDE 0.02 % IN SOLN
RESPIRATORY_TRACT | Status: AC
  Administered 2012-12-05: 0.5 mg
  Filled 2012-12-05: qty 2.5

## 2012-12-05 MED ORDER — SUCCINYLCHOLINE CHLORIDE 20 MG/ML IJ SOLN
INTRAMUSCULAR | Status: AC
  Filled 2012-12-05: qty 1

## 2012-12-05 MED ORDER — MIDAZOLAM HCL 5 MG/5ML IJ SOLN
INTRAMUSCULAR | Status: AC
  Administered 2012-12-05: 4 mg
  Filled 2012-12-05: qty 5

## 2012-12-05 MED ORDER — VECURONIUM BROMIDE 10 MG IV SOLR
5.0000 mg | Freq: Once | INTRAVENOUS | Status: AC
  Filled 2012-12-05 (×2): qty 10

## 2012-12-05 MED ORDER — VECURONIUM BROMIDE 10 MG IV SOLR: 7.0000 mg | Freq: Once | INTRAVENOUS | Status: DC

## 2012-12-05 MED ORDER — IPRATROPIUM BROMIDE 0.02 % IN SOLN: 0.5000 mg | RESPIRATORY_TRACT | Status: DC

## 2012-12-05 MED ORDER — PROPOFOL 10 MG/ML IV EMUL
5.0000 ug/kg/min | INTRAVENOUS | Status: DC
  Administered 2012-12-05: 5 ug/kg/min via INTRAVENOUS
  Administered 2012-12-05 (×2): 40 mg/h via INTRAVENOUS
  Administered 2012-12-05: 50 ug/kg/min via INTRAVENOUS

## 2012-12-05 MED ORDER — ALBUTEROL (5 MG/ML) CONTINUOUS INHALATION SOLN
INHALATION_SOLUTION | RESPIRATORY_TRACT | Status: AC
  Filled 2012-12-05: qty 20

## 2012-12-05 MED ORDER — IPRATROPIUM BROMIDE HFA 17 MCG/ACT IN AERS
4.0000 | INHALATION_SPRAY | RESPIRATORY_TRACT | Status: DC
  Administered 2012-12-06 (×3): 4 via RESPIRATORY_TRACT
  Filled 2012-12-05: qty 12.9

## 2012-12-05 MED ORDER — DEXTROSE 5 % IV SOLN
500.0000 mg | Freq: Once | INTRAVENOUS | Status: AC
  Administered 2012-12-05: 500 mg via INTRAVENOUS
  Filled 2012-12-05: qty 500

## 2012-12-05 MED ORDER — HEPARIN SODIUM (PORCINE) 5000 UNIT/ML IJ SOLN
5000.0000 [IU] | Freq: Three times a day (TID) | INTRAMUSCULAR | Status: DC
  Administered 2012-12-05 – 2012-12-08 (×9): 5000 [IU] via SUBCUTANEOUS
  Filled 2012-12-05 (×11): qty 1

## 2012-12-05 MED ORDER — SODIUM CHLORIDE 0.9 % IV SOLN
INTRAVENOUS | Status: DC
  Administered 2012-12-05 – 2012-12-06 (×2): via INTRAVENOUS

## 2012-12-05 MED ORDER — LORAZEPAM 2 MG/ML IJ SOLN
1.0000 mg | Freq: Once | INTRAMUSCULAR | Status: AC
  Administered 2012-12-05: 1 mg via INTRAVENOUS
  Filled 2012-12-05: qty 1

## 2012-12-05 MED ORDER — METHYLPREDNISOLONE SODIUM SUCC 125 MG IJ SOLR
INTRAMUSCULAR | Status: AC
  Administered 2012-12-05: 125 mg via INTRAVENOUS
  Filled 2012-12-05: qty 2

## 2012-12-05 MED ORDER — PROPOFOL 10 MG/ML IV EMUL
INTRAVENOUS | Status: AC
  Filled 2012-12-05: qty 100

## 2012-12-05 MED ORDER — VECURONIUM BROMIDE 10 MG IV SOLR
INTRAVENOUS | Status: DC | PRN
  Administered 2012-12-05 (×2): 7 mg via INTRAVENOUS
  Administered 2012-12-05: 5 mg via INTRAVENOUS

## 2012-12-05 MED ORDER — VECURONIUM BROMIDE 10 MG IV SOLR
0.8000 ug/kg/min | INTRAVENOUS | Status: DC
  Administered 2012-12-05: 0.8 ug/kg/min via INTRAVENOUS
  Filled 2012-12-05 (×2): qty 100

## 2012-12-05 MED ORDER — EPINEPHRINE 0.3 MG/0.3ML IJ DEVI: 0.3000 mg | Freq: Once | INTRAMUSCULAR | Status: DC

## 2012-12-05 MED ORDER — MIDAZOLAM HCL 2 MG/2ML IJ SOLN
2.0000 mg | Freq: Once | INTRAMUSCULAR | Status: AC
  Administered 2012-12-05: 4 mg via INTRAVENOUS

## 2012-12-05 MED ORDER — LORAZEPAM 2 MG/ML IJ SOLN
1.0000 mg | Freq: Once | INTRAMUSCULAR | Status: AC
  Administered 2012-12-05: 1 mg via INTRAVENOUS

## 2012-12-05 MED ORDER — DEXTROSE 5 % IV SOLN
1.0000 g | Freq: Once | INTRAVENOUS | Status: AC
  Administered 2012-12-05: 1 g via INTRAVENOUS
  Filled 2012-12-05: qty 10

## 2012-12-05 MED ORDER — ALBUTEROL SULFATE HFA 108 (90 BASE) MCG/ACT IN AERS
4.0000 | INHALATION_SPRAY | RESPIRATORY_TRACT | Status: DC
  Administered 2012-12-06 (×3): 4 via RESPIRATORY_TRACT
  Filled 2012-12-05: qty 6.7

## 2012-12-05 MED ORDER — ALBUTEROL SULFATE (5 MG/ML) 0.5% IN NEBU
INHALATION_SOLUTION | RESPIRATORY_TRACT | Status: AC
  Administered 2012-12-05: 2.5 mg
  Filled 2012-12-05: qty 0.5

## 2012-12-05 MED ORDER — ALBUTEROL SULFATE (5 MG/ML) 0.5% IN NEBU
15.0000 mg | INHALATION_SOLUTION | Freq: Once | RESPIRATORY_TRACT | Status: AC
  Administered 2012-12-05: 15 mg via RESPIRATORY_TRACT
  Filled 2012-12-05: qty 3

## 2012-12-05 MED ORDER — PROPOFOL 10 MG/ML IV EMUL
INTRAVENOUS | Status: AC
  Administered 2012-12-05: 5 ug/kg/min via INTRAVENOUS
  Filled 2012-12-05: qty 100

## 2012-12-05 MED ORDER — METHYLPREDNISOLONE SODIUM SUCC 125 MG IJ SOLR
125.0000 mg | Freq: Once | INTRAMUSCULAR | Status: AC
  Administered 2012-12-05: 125 mg via INTRAVENOUS

## 2012-12-05 MED ORDER — ROCURONIUM BROMIDE 50 MG/5ML IV SOLN
INTRAVENOUS | Status: AC
  Filled 2012-12-05: qty 2

## 2012-12-05 MED ORDER — SUCCINYLCHOLINE CHLORIDE 20 MG/ML IJ SOLN
INTRAMUSCULAR | Status: DC | PRN
  Administered 2012-12-05: 125 mg via INTRAVENOUS

## 2012-12-05 MED ORDER — MAGNESIUM SULFATE 40 MG/ML IJ SOLN
2.0000 g | Freq: Once | INTRAMUSCULAR | Status: AC
  Administered 2012-12-05: 2 g via INTRAVENOUS
  Filled 2012-12-05: qty 50

## 2012-12-05 MED ORDER — ALBUTEROL SULFATE HFA 108 (90 BASE) MCG/ACT IN AERS: 4.0000 | INHALATION_SPRAY | RESPIRATORY_TRACT | Status: DC | PRN

## 2012-12-05 MED ORDER — ALBUTEROL SULFATE (5 MG/ML) 0.5% IN NEBU: 2.5000 mg | INHALATION_SOLUTION | RESPIRATORY_TRACT | Status: DC

## 2012-12-05 MED ORDER — IPRATROPIUM BROMIDE 0.02 % IN SOLN
0.5000 mg | Freq: Once | RESPIRATORY_TRACT | Status: AC
  Administered 2012-12-05: 0.5 mg via RESPIRATORY_TRACT
  Filled 2012-12-05: qty 2.5

## 2012-12-05 MED ORDER — MIDAZOLAM HCL 2 MG/2ML IJ SOLN
2.0000 mg | INTRAMUSCULAR | Status: DC | PRN
  Administered 2012-12-06 (×2): 4 mg via INTRAVENOUS
  Filled 2012-12-05 (×4): qty 4

## 2012-12-05 MED ORDER — ALBUTEROL SULFATE (5 MG/ML) 0.5% IN NEBU
15.0000 mg | INHALATION_SOLUTION | Freq: Once | RESPIRATORY_TRACT | Status: AC
  Administered 2012-12-05: 15 mg via RESPIRATORY_TRACT
  Filled 2012-12-05 (×2): qty 3

## 2012-12-05 MED ORDER — DEXAMETHASONE 4 MG PO TABS
16.0000 mg | ORAL_TABLET | Freq: Once | ORAL | Status: AC
  Administered 2012-12-05: 16 mg via ORAL
  Filled 2012-12-05: qty 4

## 2012-12-05 NOTE — ED Notes (Signed)
Pt in bed, NAD noted, pt is adequately sedated and vecuronium has been started. Verified by dr Adriana Simas. Will reassess in 30 minutes and if pt remains sedated will remove soft wrist restraints. Vital signs stable, resp therapy at bedside.

## 2012-12-05 NOTE — ED Notes (Signed)
Reported to sam from [facility]. ETA 10 min.

## 2012-12-05 NOTE — ED Notes (Signed)
Moved pt to room 2 for telemetry monitoring during neb treatment.

## 2012-12-05 NOTE — ED Notes (Signed)
[facility] has arrived for transport of pt.

## 2012-12-05 NOTE — ED Notes (Signed)
Patient continues to be anxious.Dr

## 2012-12-05 NOTE — ED Notes (Addendum)
Pt c/o SOB, chest tightness. Pt states she's been using her steroids and inhaler at home but no relief met. Pt also c/o dysuria and states "I think I have a UTI".

## 2012-12-05 NOTE — ED Notes (Signed)
Patient fighting the ventilator, attempting to remove ETT. Dr Adriana Simas aware, order for 7 mg vecuronium obtained.

## 2012-12-05 NOTE — ED Notes (Signed)
Called to room by patient. Patient very anxious, heart rate increased to 140s. Patient reports anxiety. 1 mg ativan given per MD order. Coached patient to focus on breathing slow and deep. Patient compliant. Patient's slowly decreasing respirations and heart rate.

## 2012-12-05 NOTE — ED Notes (Signed)
Serita Butcher, [facility] RN called to bedside for assistance with helping coach patient's breathing and to help with patient's anxiety levels.

## 2012-12-05 NOTE — ED Provider Notes (Signed)
History    31 year old female with dyspnea. Patient has a history of asthma and says her current symptoms feel similar to previous exacerbations. Symptoms started on Christmas Eve and been progressively worsening. She has been using duo nebs at home which only helps symptoms briefly. She has also been self administering herself steroids which had been previously prescribed from prior exacerbation. No fevers or chills. Nonproductive cough. No unusual leg pain or swelling. Denies any chest pain. No sick contacts. Patient has been previously admitted for her asthma, but she's never been intubated or to the ICU.  CSN: [id number]  Arrival date & time 12/05/12  1212   First MD Initiated Contact with Patient 12/05/12 1348      Chief Complaint  Patient presents with  . Asthma  . Shortness of Breath    (Consider location/radiation/quality/duration/timing/severity/associated sxs/prior treatment) HPI  Past Medical History  Diagnosis Date  . Asthma   . Pregnancy as incidental finding   . Complication of anesthesia   . GERD (gastroesophageal reflux disease)     Past Surgical History  Procedure Date  . Nasal sinus surgery   . Cholecystectomy 2001  . Lypoma removal 2003    right arm   . Laparoscopic tubal ligation 08/19/2012    Procedure: LAPAROSCOPIC TUBAL LIGATION;  Surgeon: Lazaro Arms, MD;  Location: [facility];  Service: Gynecology;  Laterality: Bilateral;    History reviewed. No pertinent family history.  History  Substance Use Topics  . Smoking status: Former Smoker -- 1.0 packs/day for 2 years    Types: Cigarettes  . Smokeless tobacco: Never Used  . Alcohol Use: No     Comment: weekly    OB History    Grav Para Term Preterm Abortions TAB SAB Ect Mult Living   3 3 2 1  0 0 0 0 0 3      Review of Systems  All systems reviewed and negative, other than as noted in HPI.   Allergies  Review of patient's allergies indicates no known allergies.  Home Medications    Current Outpatient Rx  Name  Route  Sig  Dispense  Refill  . IPRATROPIUM BROMIDE HFA 17 MCG/ACT IN AERS   Inhalation   Inhale 2 puffs into the lungs every 6 (six) hours.   1 Inhaler   12   . IPRATROPIUM-ALBUTEROL 0.5-2.5 (3) MG/3ML IN SOLN   Nebulization   Take 3 mLs by nebulization 2 (two) times daily.   75 mL   0   . LANSOPRAZOLE 30 MG PO CPDR   Oral   Take 30 mg by mouth daily.         . ALBUTEROL SULFATE HFA 108 (90 BASE) MCG/ACT IN AERS   Inhalation   Inhale 1-2 puffs into the lungs every 6 (six) hours as needed for wheezing.   1 Inhaler   0   . ALBUTEROL SULFATE (2.5 MG/3ML) 0.083% IN NEBU   Nebulization   Take 3 mLs (2.5 mg total) by nebulization every 6 (six) hours as needed for wheezing.   75 mL   12     BP 122/51  Pulse 102  Temp 98 F (36.7 C) (Oral)  Resp 22  Ht 5\' 7"  (1.702 m)  Wt 193 lb (87.544 kg)  BMI 30.23 kg/m2  SpO2 99%  LMP 10/25/2012  Physical Exam  Nursing note and vitals reviewed. Constitutional: She appears well-developed and well-nourished. No distress.  HENT:  Head: Normocephalic and atraumatic.  Eyes: Conjunctivae normal are normal. Right  eye exhibits no discharge. Left eye exhibits no discharge.  Neck: Neck supple.  Cardiovascular: Normal rate, regular rhythm and normal heart sounds.  Exam reveals no gallop and no friction rub.   No murmur heard.      Mild tachycardia  Pulmonary/Chest: Effort normal. No respiratory distress. She has wheezes.       Patient has some tachypnea with a respiration rate the low-to-mid 20s. She is audibly wheezing.   Abdominal: Soft. She exhibits no distension. There is no tenderness.  Musculoskeletal: She exhibits no edema and no tenderness.       Lower extremities symmetric as compared to each other. No calf tenderness. Negative Homan's. No palpable cords.   Neurological: She is alert.  Skin: Skin is warm and dry. She is not diaphoretic.  Psychiatric: She has a normal mood and affect. Her  behavior is normal. Thought content normal.    ED Course  Procedures (including critical care time)   Labs Reviewed  URINALYSIS, ROUTINE W REFLEX MICROSCOPIC  PREGNANCY, URINE  CBC  BASIC METABOLIC PANEL   No results found.   1. Asthma exacerbation       MDM  31yF with dyspnea. Consistent with asthma exacerbation.  Pt with mild-to-moderate increased WOB. Her oxygen saturations have remained in high 90s even before supplemental O2 and nebs started. Pt received steroids and an hour long neb tx. Reassessment after this with persistent wheezing, but better air movement on auscultation. She appears to be fairly comfortable in bed, but states she feels no different though. Possibly some component of anxiety. Will repeat neb. Will place IV and obtain basic labs in case will need admission although I anticipate discharge.          Raeford Razor, MD 12/05/12 [phone]

## 2012-12-05 NOTE — ED Notes (Signed)
Worthy Rancher [facility] RN called this RN and Dr Adriana Simas to bedside. Patient started to have seizure-like activity and started coughing/spitting up copious amounts of white secretions. HR dropped from 150s to 70s and respiratory rate increased to 48 per minute. Patient unresponsive and ashen grey in color. Nail beds noted to be purple/blue. Patient not oxygenating well and RT initiated assisting patient with respirations via BVM. RSI kit pulled from pyxis.

## 2012-12-05 NOTE — ED Notes (Signed)
CRITICAL VALUE ALERT  Critical value received:  PCO2, pH  Date of notification:  12/05/12  Time of notification:  1729  Critical value read back:yes  Nurse who received alert:  Lake Bells, RN  MD notified (1st page):  Dr Adriana Simas  Time of first page:  1729

## 2012-12-05 NOTE — ED Provider Notes (Addendum)
History     CSN: [id number]  Arrival date & time 12/05/12  1212   First MD Initiated Contact with Patient 12/05/12 1348      Chief Complaint  Patient presents with  . Asthma  . Shortness of Breath    (Consider location/radiation/quality/duration/timing/severity/associated sxs/prior treatment) HPI  Past Medical History  Diagnosis Date  . Asthma   . Pregnancy as incidental finding   . Complication of anesthesia   . GERD (gastroesophageal reflux disease)     Past Surgical History  Procedure Date  . Nasal sinus surgery   . Cholecystectomy 2001  . Lypoma removal 2003    right arm   . Laparoscopic tubal ligation 08/19/2012    Procedure: LAPAROSCOPIC TUBAL LIGATION;  Surgeon: Lazaro Arms, MD;  Location: [facility];  Service: Gynecology;  Laterality: Bilateral;    History reviewed. No pertinent family history.  History  Substance Use Topics  . Smoking status: Former Smoker -- 1.0 packs/day for 2 years    Types: Cigarettes  . Smokeless tobacco: Never Used  . Alcohol Use: No     Comment: weekly    OB History    Grav Para Term Preterm Abortions TAB SAB Ect Mult Living   3 3 2 1  0 0 0 0 0 3      Review of Systems  Allergies  Review of patient's allergies indicates no known allergies.  Home Medications   Current Outpatient Rx  Name  Route  Sig  Dispense  Refill  . IPRATROPIUM BROMIDE HFA 17 MCG/ACT IN AERS   Inhalation   Inhale 2 puffs into the lungs every 6 (six) hours.   1 Inhaler   12   . IPRATROPIUM-ALBUTEROL 0.5-2.5 (3) MG/3ML IN SOLN   Nebulization   Take 3 mLs by nebulization 2 (two) times daily.   75 mL   0   . LANSOPRAZOLE 30 MG PO CPDR   Oral   Take 30 mg by mouth daily.         . ALBUTEROL SULFATE HFA 108 (90 BASE) MCG/ACT IN AERS   Inhalation   Inhale 1-2 puffs into the lungs every 6 (six) hours as needed for wheezing.   1 Inhaler   0   . ALBUTEROL SULFATE (2.5 MG/3ML) 0.083% IN NEBU   Nebulization   Take 3 mLs (2.5 mg total) by  nebulization every 6 (six) hours as needed for wheezing.   75 mL   12     BP 122/51  Pulse 137  Temp 98 F (36.7 C) (Oral)  Resp 23  Ht 5\' 7"  (1.702 m)  Wt 193 lb (87.544 kg)  BMI 30.23 kg/m2  SpO2 100%  LMP 10/25/2012  Physical Exam  ED Course  Procedures (including critical care time)  Labs Reviewed  CBC - Abnormal; Notable for the following:    RBC 5.32 (*)     All other components within normal limits  URINALYSIS, ROUTINE W REFLEX MICROSCOPIC  PREGNANCY, URINE  BASIC METABOLIC PANEL    Dg Chest 2 View  12/05/2012  *RADIOLOGY REPORT*  Clinical Data: Endotracheal tube placement  CHEST - 2 VIEW  Comparison: 11/14/2012  Findings: Apparently, two films were obtained as part of this exam. On the first film, the endotracheal tube tip is 1.6 cm above the base of the carina. The lungs are clear without focal infiltrate, edema, pneumothorax or pleural effusion.  On the film labeled #2, there is now in [name] tube evident with the tip overlying the mid  stomach.  Defibrillator pads overlie the left chest. Telemetry leads overlie the chest.  IMPRESSION: Endotracheal tube tip is 1.6 cm above the base of the carina.  NG tube tip overlies the mid stomach.   Original Report Authenticated By: Kennith Center, M.D.    No results found.   1. Respiratory failure    CRITICAL CARE Performed by: Donnetta Hutching   Total critical care time: 90 Critical care time was exclusive of separately billable procedures and treating other patients.  Critical care was necessary to treat or prevent imminent or life-threatening deterioration.  Critical care was time spent personally by me on the following activities: development of treatment plan with patient and/or surrogate as well as nursing, discussions with consultants, evaluation of patient's response to treatment, examination of patient, obtaining history from patient or surrogate, ordering and performing treatments and interventions, ordering and review  of laboratory studies, ordering and review of radiographic studies, pulse oximetry and re-evaluation of patient's condition.   MDM  Patient rechecked several times between 15:30 and 16:15.    Patient continued to deteriorate despite multiple rounds of breathing treatments, IV steroids, IV Ativan.  Patient intubated at approximately 16:30 with a 7.5 ET tube using the RSI technique using etomidate 20 mg IV and succinylcholine 100 mg IV.  Good CO2 exchange. Patient's oxygenation improved.  propofol drip started for sedation.  Chest x-ray confirms tube placement.Marland Kitchen  discussed with critical care physician [facility]. Transfer.       Donnetta Hutching, MD 12/05/12 1701  Donnetta Hutching, MD 12/05/12 [phone]

## 2012-12-05 NOTE — ED Notes (Signed)
Patient remains anxious. Dr Adriana Simas informed who took over care for Dr Juleen China. Verbal order to give 1 mg ativan IV obtained. See Mar for administration details.

## 2012-12-05 NOTE — ED Notes (Signed)
Ventilator settings changed by RT. Respiratory rate 16, FiO2 50%, Peep 5, TV 500.

## 2012-12-05 NOTE — ED Notes (Signed)
RSI NOTE: 1630 : 20 mg Etomidate given via 20 gauge IV. See MAR for administration details 1631: 125 mg Succinylcholine given via 20 gauge IV. See MAR for administration details 1632: HR 143, patient difficult to intubate 1635: HR 128, O2 sats 91% Dr Adriana Simas aware [phone]: Patient intubated with 7.5 mm ETT, 23 cm at teeth. Equal breath sounds bilaterally, positive color change on ETCO2 detector.  1637: copious amounts of white secretions suctioned by RT from ETT and mouth.  1638: HR120, O2 sats 100% via BVM/assisted ventilations by RT. 1440: Stat X ray obtained for ETT placement. 1443: respiratory therapy placed patient on ventilator. Settings: 5 PEEP, TV 500, 14 respiratory rate, 100% FiO2

## 2012-12-05 NOTE — ED Notes (Signed)
Verbal order 7 mg vecuronium IV obtained by Dr Adriana Simas. Patient fight ventilator and trying to remove ETT.

## 2012-12-05 NOTE — ED Notes (Signed)
I called [facility] to give Bjorn Loser rm # at [facility] ([id number]).  She said, "that it may be a couple of hours before a truck is available".  I informed Annie Paras., RN.

## 2012-12-05 NOTE — ED Notes (Signed)
RN has been 1:1 care with patient since 1600.

## 2012-12-06 DIAGNOSIS — J45902 Unspecified asthma with status asthmaticus: Principal | ICD-10-CM

## 2012-12-06 DIAGNOSIS — J9600 Acute respiratory failure, unspecified whether with hypoxia or hypercapnia: Secondary | ICD-10-CM

## 2012-12-06 LAB — BASIC METABOLIC PANEL
Chloride: 104 mEq/L (ref 96–112)
GFR calc Af Amer: >90 mL/min (ref >90)
GFR calc non Af Amer: >90 mL/min (ref >90)
Glucose, Bld: 122 mg/dL — ABNORMAL HIGH (ref 70–99)
Potassium: 4.4 mEq/L (ref 3.5–5.1)
Sodium: 136 mEq/L (ref 135–145)

## 2012-12-06 LAB — CBC
Hemoglobin: 12.6 g/dL (ref 12.0–15.0)
MCHC: 33.1 g/dL (ref 30.0–36.0)
RDW: 13.6 % (ref 11.5–15.5)
WBC: 8.2 10*3/uL (ref 4.0–10.5)

## 2012-12-06 MED ORDER — METHYLPREDNISOLONE SODIUM SUCC 125 MG IJ SOLR
60.0000 mg | Freq: Two times a day (BID) | INTRAMUSCULAR | Status: DC
  Administered 2012-12-06 – 2012-12-07 (×3): 60 mg via INTRAVENOUS
  Filled 2012-12-06 (×5): qty 0.96
  Filled 2012-12-06: qty 2
  Filled 2012-12-06: qty 0.96

## 2012-12-06 MED ORDER — BUDESONIDE 0.5 MG/2ML IN SUSP
0.5000 mg | Freq: Two times a day (BID) | RESPIRATORY_TRACT | Status: DC
  Administered 2012-12-06 – 2012-12-08 (×4): 0.5 mg via RESPIRATORY_TRACT
  Filled 2012-12-06 (×8): qty 2

## 2012-12-06 MED ORDER — ALBUTEROL SULFATE (5 MG/ML) 0.5% IN NEBU
2.5000 mg | INHALATION_SOLUTION | RESPIRATORY_TRACT | Status: DC | PRN
  Administered 2012-12-08: 2.5 mg via RESPIRATORY_TRACT

## 2012-12-06 MED ORDER — MONTELUKAST SODIUM 10 MG PO TABS
10.0000 mg | ORAL_TABLET | Freq: Every day | ORAL | Status: DC
  Administered 2012-12-06 – 2012-12-07 (×2): 10 mg via ORAL
  Filled 2012-12-06 (×4): qty 1

## 2012-12-06 MED ORDER — DEXTROSE 5 % IV SOLN
500.0000 mg | INTRAVENOUS | Status: DC
  Administered 2012-12-06: 500 mg via INTRAVENOUS
  Filled 2012-12-06 (×2): qty 500

## 2012-12-06 MED ORDER — ONDANSETRON HCL 4 MG/2ML IJ SOLN
INTRAMUSCULAR | Status: AC
  Administered 2012-12-06: 4 mg via INTRAVENOUS
  Filled 2012-12-06: qty 2

## 2012-12-06 MED ORDER — ONDANSETRON HCL 4 MG/2ML IJ SOLN: 4.0000 mg | Freq: Once | INTRAMUSCULAR | Status: AC

## 2012-12-06 MED ORDER — PANTOPRAZOLE SODIUM 40 MG PO TBEC
40.0000 mg | DELAYED_RELEASE_TABLET | Freq: Every day | ORAL | Status: DC
  Administered 2012-12-06: 40 mg via ORAL
  Filled 2012-12-06: qty 1

## 2012-12-06 MED ORDER — ONDANSETRON HCL 4 MG/2ML IJ SOLN
4.0000 mg | Freq: Four times a day (QID) | INTRAMUSCULAR | Status: DC | PRN
  Administered 2012-12-06: 4 mg via INTRAVENOUS

## 2012-12-06 MED ORDER — IPRATROPIUM BROMIDE 0.02 % IN SOLN: 0.5000 mg | RESPIRATORY_TRACT | Status: DC | PRN

## 2012-12-06 MED ORDER — DEXTROSE 5 % IV SOLN
1.0000 g | INTRAVENOUS | Status: DC
  Administered 2012-12-06: 1 g via INTRAVENOUS
  Filled 2012-12-06 (×2): qty 10

## 2012-12-06 MED ORDER — ALBUTEROL SULFATE (5 MG/ML) 0.5% IN NEBU
2.5000 mg | INHALATION_SOLUTION | Freq: Four times a day (QID) | RESPIRATORY_TRACT | Status: DC
  Administered 2012-12-06 – 2012-12-08 (×8): 2.5 mg via RESPIRATORY_TRACT
  Filled 2012-12-06 (×9): qty 0.5

## 2012-12-06 MED ORDER — IPRATROPIUM BROMIDE 0.02 % IN SOLN
0.5000 mg | Freq: Four times a day (QID) | RESPIRATORY_TRACT | Status: DC
  Administered 2012-12-06 – 2012-12-08 (×8): 0.5 mg via RESPIRATORY_TRACT
  Filled 2012-12-06 (×9): qty 2.5

## 2012-12-06 MED ORDER — PROPOFOL 10 MG/ML IV EMUL
5.0000 ug/kg/min | INTRAVENOUS | Status: DC
  Administered 2012-12-06: 50 ug/kg/min via INTRAVENOUS
  Filled 2012-12-06 (×2): qty 100

## 2012-12-06 NOTE — Progress Notes (Signed)
PULMONARY  / CRITICAL CARE MEDICINE  Name: Katrina Mitchell MRN: [id number] DOB: November 02, 1981    LOS: 1  REFERRING MD :  Donnetta Hutching  CHIEF COMPLAINT:  Short of breath  BRIEF PATIENT DESCRIPTION:  31 yo female former smoker transferred from [facility] with acute respiratory failure 2nd to asthma exacerbation.  LINES / TUBES: ETT 12/28 >> 12/29 (self extubated)  CULTURES: Influenza 12/29 >>  ANTIBIOTICS: Zithromax 12/28 >> Rocephin 12/28 >>  SIGNIFICANT EVENTS:  12/28 VDRF, paralytic  LEVEL OF CARE:  ICU PRIMARY SERVICE:  PCCM CONSULTANTS:  None CODE STATUS: Full DIET:  Regular DVT Px:  SQ heparin GI Px: Protonix  INTERVAL HISTORY:  Tolerated SBT.  VITAL SIGNS: Temp:  [97.5 F (36.4 C)-98.8 F (37.1 C)] 98.8 F (37.1 C) (12/29 0739) Pulse Rate:  [78-137] 82  (12/29 0730) Resp:  [16-27] 16  (12/29 0730) BP: (88-165)/(43-109) 116/69 mmHg (12/29 0730) SpO2:  [96 %-100 %] 100 % (12/29 0730) FiO2 (%):  [40 %-100 %] 40 % (12/29 0730) Weight:  [193 lb (87.544 kg)] 193 lb (87.544 kg) (12/28 1242) HEMODYNAMICS:   VENTILATOR SETTINGS: Vent Mode:  [-] PSV;CPAP FiO2 (%):  [40 %-100 %] 40 % Set Rate:  [12 bmp-16 bmp] 16 bmp Vt Set:  [500 mL] 500 mL PEEP:  [5 cmH20] 5 cmH20 Pressure Support:  [5 cmH20] 5 cmH20 Plateau Pressure:  [23 cmH20-25 cmH20] 23 cmH20 INTAKE / OUTPUT: Intake/Output      12/28 0701 - 12/29 0700 12/29 0701 - 12/30 0700   I.V. (mL/kg) 632.5 (7.2)    Total Intake(mL/kg) 632.5 (7.2)    Urine (mL/kg/hr) 1200 (0.6) 150   Total Output 1200 150   Net -567.5 -150          PHYSICAL EXAMINATION: General:  No distress Neuro:  Follows commands HEENT: No sinus tenderness Cardiovascular:  s1s2 regular, no murmur Lungs:  No wheezing Abdomen:  Soft, non-tender Musculoskeletal:  No edema Skin:  No rashes   LABS: Cbc  Lab 12/06/12 0450 12/05/12 1533  WBC 8.2 --  HGB 12.6 14.3  HCT 38.1 41.7  PLT 299 319    Chemistry   Lab 12/06/12 0450 12/05/12 1533   NA 136 138  K 4.4 3.5  CL 104 104  CO2 24 25  BUN 7 7  CREATININE 0.59 0.56  CALCIUM 8.9 9.2  MG 2.2 --  PHOS 3.8 --  GLUCOSE 122* 88    Liver fxn No results found for this basename: AST:3,ALT:3,ALKPHOS:3,BILITOT:3,PROT:3,ALBUMIN:3 in the last 168 hours coags No results found for this basename: APTT:3,INR:3 in the last 168 hours Sepsis markers No results found for this basename: LATICACIDVEN:3,PROCALCITON:3 in the last 168 hours Cardiac markers No results found for this basename: CKTOTAL:3,CKMB:3,TROPONINI:3 in the last 168 hours BNP No results found for this basename: PROBNP:3 in the last 168 hours ABG  Lab 12/05/12 2304 12/05/12 1858 12/05/12 1724  PHART 7.336* 7.266* 7.184*  PCO2ART 41.4 49.9* 59.8*  PO2ART 94.0 143.0* 553.0*  HCO3 21.5 22.0 21.7  TCO2 22.8 20.3 20.4    CBG trend No results found for this basename: GLUCAP:5 in the last 168 hours  IMAGING: Dg Chest 2 View  12/05/2012  *RADIOLOGY REPORT*  Clinical Data: Endotracheal tube placement  CHEST - 2 VIEW  Comparison: 11/14/2012  Findings: Apparently, two films were obtained as part of this exam. On the first film, the endotracheal tube tip is 1.6 cm above the base of the carina. The lungs are clear without focal infiltrate, edema,  pneumothorax or pleural effusion.  On the film labeled #2, there is now in [name] tube evident with the tip overlying the mid stomach.  Defibrillator pads overlie the left chest. Telemetry leads overlie the chest.  IMPRESSION: Endotracheal tube tip is 1.6 cm above the base of the carina.  NG tube tip overlies the mid stomach.   Original Report Authenticated By: Kennith Center, M.D.     ASSESSMENT / PLAN:  PULMONARY  ASSESSMENT: Acute respiratory failure 2nd to acute asthma exacerbation. PLAN:   Self-extubated 12/29 Scheduled atrovent/albuterol/pulmicort Continue solumedrol Add singulair  CARDIOVASCULAR  ASSESSMENT:  Sinus tachycardia related to respiratory failure. PLAN:    Monitor on Tele  RENAL  ASSESSMENT:   No issues. PLAN:   D/c Foley soon if stable after extubation  GASTROINTESTINAL  ASSESSMENT:   Nutrition. PLAN:   Advance diet after extubation  HEMATOLOGIC  ASSESSMENT:   No issues. PLAN:  F/u CBC  INFECTIOUS  ASSESSMENT:   Acute bronchitis. PLAN:   Continue rocephin, zithromax for now  ENDOCRINE  ASSESSMENT:   No issues PLAN:   Monitor blood glucose on BMET  NEUROLOGIC  ASSESSMENT:   Acute encephalopathy 2nd to respiratory arrest. Resolved. PLAN:   Monitor mental status.  Critical care time 35 minutes.  Coralyn Helling, MD [facility] Pulmonary/Critical Care 12/06/2012, 8:10 AM Pager:  [phone] After 3pm call: [phone]

## 2012-12-06 NOTE — H&P (Addendum)
Name: Katrina Mitchell MRN: [id number] DOB: 01-09-1981    LOS: 1  PULMONARY / CRITICAL CARE MEDICINE  HPI:   31 years old female with PMH relevant for asthma. Transferred from [facility] intubated. Unable to provide history. As per the records, presented to the ED at [facility] with one week history of cough and SOB. She also had associated dizziness and chest soreness. No fever. She was seen by PCP 4 days ago and was prescribed prednisone 10 mg. Her symptoms continued to worsen despite albuterol treatment avery 30 minutes. At arrival to the ED at [facility] was stable but deteriorated and was intubated. She received IV steroids, antibiotics and respiratory treatments. She was started on propofol and vecuronium drip and transferred to [facility]. At the time of my exam the patient is intubated, partially sedated, breathing spontaneously and moving all 4 extremities. Hemodynamically stable, sataurating 100% on 40% FiO2.  Past Medical History  Diagnosis Date  . Asthma   . Pregnancy as incidental finding   . Complication of anesthesia   . GERD (gastroesophageal reflux disease)    Past Surgical History  Procedure Date  . Nasal sinus surgery   . Cholecystectomy 2001  . Lypoma removal 2003    right arm   . Laparoscopic tubal ligation 08/19/2012    Procedure: LAPAROSCOPIC TUBAL LIGATION;  Surgeon: Lazaro Arms, MD;  Location: [facility] ORS;  Service: Gynecology;  Laterality: Bilateral;   Prior to Admission medications   Medication Sig Start Date End Date Taking? Authorizing Provider  ipratropium (ATROVENT HFA) 17 MCG/ACT inhaler Inhale 2 puffs into the lungs every 6 (six) hours. 11/10/12  Yes Benny Lennert, MD  ipratropium-albuterol (DUONEB) 0.5-2.5 (3) MG/3ML SOLN Take 3 mLs by nebulization 2 (two) times daily. 11/21/11  Yes Donnetta Hutching, MD  lansoprazole (PREVACID) 30 MG capsule Take 30 mg by mouth daily.   Yes Historical Provider, MD  albuterol (PROVENTIL HFA;VENTOLIN HFA) 108 (90 BASE) MCG/ACT inhaler Inhale  1-2 puffs into the lungs every 6 (six) hours as needed for wheezing. 11/21/11 11/20/12  Donnetta Hutching, MD  albuterol (PROVENTIL) (2.5 MG/3ML) 0.083% nebulizer solution Take 3 mLs (2.5 mg total) by nebulization every 6 (six) hours as needed for wheezing. 11/21/11 11/20/12  Donnetta Hutching, MD   Allergies No Known Allergies  Family History History reviewed. No pertinent family history. Social History  reports that she has quit smoking. Her smoking use included Cigarettes. She has a 2 pack-year smoking history. She has never used smokeless tobacco. She reports that she does not drink alcohol or use illicit drugs.  Review Of Systems:  Unable to provide.   Vital Signs: Temp:  [97.5 F (36.4 C)-98.4 F (36.9 C)] 97.6 F (36.4 C) (12/28 2000) Pulse Rate:  [93-137] 107  (12/28 2200) Resp:  [19-27] 25  (12/28 2200) BP: (108-165)/(51-109) 121/68 mmHg (12/28 2200) SpO2:  [96 %-100 %] 100 % (12/29 0007) FiO2 (%):  [40 %-100 %] 40 % (12/29 0007) Weight:  [193 lb (87.544 kg)] 193 lb (87.544 kg) (12/28 1242)  Physical Examination: General:  Intubated, mechanically ventilated, no acute distress Neuro:  Sedated, synchronous, nonfocal HEENT:  PERRL, pink conjunctivae, moist membranes Neck:  Supple, no JVD   Cardiovascular:  RRR, no M/R/G Lungs:  Adequate air entry, mild to moderate expiratory wheezing bilaterally. Abdomen:  Soft, nontender, nondistended, bowel sounds present Musculoskeletal:  Moves all extremities, no pedal edema Skin:  No rash    ASSESSMENT AND PLAN  PULMONARY  Lab 12/05/12 2304 12/05/12 1858  12/05/12 1724  PHART 7.336* 7.266* 7.184*  PCO2ART 41.4 49.9* 59.8*  PO2ART 94.0 143.0* 553.0*  HCO3 21.5 22.0 21.7  O2SAT 97.7 99.2 99.5   Ventilator Settings: Vent Mode:  [-] PRVC FiO2 (%):  [40 %-100 %] 40 % Set Rate:  [12 bmp-16 bmp] 16 bmp Vt Set:  [500 mL] 500 mL PEEP:  [5 cmH20] 5 cmH20 Plateau Pressure:  [23 cmH20-25 cmH20] 23 cmH20 CXR:  No acute infiltrates ETT:   Adequate position  A:   1) Acute hypercarbic respiratory failure due to acute asthma exacerbation P:   - Will admit to MICU - Mechanical ventilation   - PRVC, Vt: 8cc/kg, PEEP: 5, RR: 16, FiO2 40%   - We will monitor for auto peep   - SBT in am if meets criteria - Methylprednisolone 60 mg IV q12 - Rocephin / azithromycin - Albuterol scheduled q4hrs and PRN - Ipratropium scheduled - Influenza PCR - We will optimize sedation with versed and fentanyl - May need to be paralyzed if persistent respiratory acidosis despite adequate sedation.  CARDIOVASCULAR No results found for this basename: TROPONINI:5,LATICACIDVEN:5, O2SATVEN:5,PROBNP:5 in the last 168 hours ECG:  Ordered Lines: Peripheral lines  A:  1) No issues   RENAL  Lab 12/05/12 1533  NA 138  K 3.5  CL 104  CO2 25  BUN 7  CREATININE 0.56  CALCIUM 9.2  MG --  PHOS --   Intake/Output      12/28 0701 - 12/29 0700   I.V. (mL/kg) 32.5 (0.4)   Total Intake(mL/kg) 32.5 (0.4)   Net +32.5        Foley:  12/05/12  A:   1) No issues   GASTROINTESTINAL No results found for this basename: AST:5,ALT:5,ALKPHOS:5,BILITOT:5,PROT:5,ALBUMIN:5 in the last 168 hours  A:   1) No issues P:   - GI prophylaxis with protonix  HEMATOLOGIC  Lab 12/05/12 1533  HGB 14.3  HCT 41.7  PLT 319  INR --  APTT --   A:   1) No issues  INFECTIOUS  Lab 12/05/12 1533  WBC 10.2  PROCALCITON --     Antibiotics: Rocephin / azithromycin  A:   1) No acute infiltrates on X ray P:   - Empiric antibiotics  ENDOCRINE No results found for this basename: GLUCAP:5 in the last 168 hours A:   1) No issues    NEUROLOGIC  A:  1) Intubated and sedated P:   - We will start Versed and fentanyl - Will discontinue propofol  BEST PRACTICE / DISPOSITION - Level of Care:  ICU - Primary Service:  PCCM - Consultants:  None - Code Status:  Full code - Diet:  NPO - DVT Px:  Heparin - GI Px:  Protonix - Skin Integrity:   Intact    Critical Care Time devoted to patient care services described in this note is: 1 Hour  Overton Mam, M.D. Pulmonary and Critical Care Medicine [facility] Pager: [phone]  12/06/2012, 12:12 AM

## 2012-12-06 NOTE — ED Notes (Signed)
Mild twitching noted, carelink aware and have contacted critical care regarding possible need to titrate vecuronium or propofol up.

## 2012-12-06 NOTE — Procedures (Signed)
Extubation Procedure Note Self extubation at 0745.   Patient Details:   Name: Katrina Mitchell DOB: [birth year] MRN: [id number]   Airway Documentation:     Evaluation  O2 sats: stable throughout Complications: No apparent complications Patient did tolerate procedure well. Bilateral Breath Sounds: Diminished Suctioning: Airway Yes  Devra Dopp D 12/06/2012, 12:24 PM

## 2012-12-06 NOTE — Progress Notes (Signed)
eLink Physician-Brief Progress Note Patient Name: Katrina Mitchell DOB: 07-13-1981 MRN: [id number]  Date of Service  12/06/2012   HPI/Events of Note   Severe dyschrony off prop  eICU Interventions  Re add prop   Intervention Category Major Interventions: Change in mental status - evaluation and management  Nelda Bucks. 12/06/2012, 12:57 AM

## 2012-12-07 ENCOUNTER — Encounter (HOSPITAL_COMMUNITY): Payer: Self-pay

## 2012-12-07 LAB — CBC
Hemoglobin: 12.5 g/dL (ref 12.0–15.0)
MCH: 26.3 pg (ref 26.0–34.0)
MCHC: 33.2 g/dL (ref 30.0–36.0)

## 2012-12-07 LAB — BASIC METABOLIC PANEL
BUN: 10 mg/dL (ref 6–23)
Creatinine, Ser: 0.53 mg/dL (ref 0.50–1.10)
GFR calc non Af Amer: >90 mL/min (ref >90)
Glucose, Bld: 112 mg/dL — ABNORMAL HIGH (ref 70–99)
Potassium: 3.9 mEq/L (ref 3.5–5.1)

## 2012-12-07 LAB — URINE CULTURE

## 2012-12-07 MED ORDER — AZITHROMYCIN 500 MG PO TABS
500.0000 mg | ORAL_TABLET | ORAL | Status: DC
  Administered 2012-12-07: 500 mg via ORAL
  Filled 2012-12-07 (×2): qty 1

## 2012-12-07 MED ORDER — METHYLPREDNISOLONE SODIUM SUCC 40 MG IJ SOLR
20.0000 mg | Freq: Two times a day (BID) | INTRAMUSCULAR | Status: DC
  Administered 2012-12-07 – 2012-12-08 (×2): 20 mg via INTRAVENOUS
  Filled 2012-12-07 (×4): qty 0.5

## 2012-12-07 MED ORDER — PANTOPRAZOLE SODIUM 20 MG PO TBEC
20.0000 mg | DELAYED_RELEASE_TABLET | Freq: Every day | ORAL | Status: DC
  Administered 2012-12-07: 20 mg via ORAL
  Filled 2012-12-07 (×2): qty 1

## 2012-12-07 MED FILL — Medication: Qty: 1 | Status: AC

## 2012-12-07 NOTE — Progress Notes (Signed)
Clinical Social Work Department BRIEF PSYCHOSOCIAL ASSESSMENT 12/07/2012  Patient:  Katrina Mitchell, Katrina Mitchell     Account Number:  [id number]     Admit date:  12/05/2012  Clinical Social Worker:  Dennison Bulla  Date/Time:  12/07/2012 02:10 PM  Referred by:  Physician  Date Referred:  12/07/2012 Referred for  Other - See comment   Other Referral:   Interview type:  Patient Other interview type:    PSYCHOSOCIAL DATA Living Status:  FAMILY Admitted from facility:   Level of care:   Primary support name:  Marchelle Folks Primary support relationship to patient:  FRIEND Degree of support available:   Lacking    CURRENT CONCERNS Current Concerns  Financial Resources   Other Concerns:    SOCIAL WORK ASSESSMENT / PLAN CSW received referral due to patient having questions regarding Medicaid application. CSW reviewed chart spoke with financial counselor prior to meeting with patient. FC reports that patient is on her list and she will follow up with her regarding Medicaid.    CSW met with patient at bedside. No visitors present. CSW introduced myself and explained role. Patient reports she was previously fired from her job at [employer] after having to leave early one night due to her asthma. Patient reports he had used all of her FMLA and since she did not have time then the company had to let her go. Patient reports she currently receives [facility] and food stamps for her children. CSW provided patient with information for [facility] DSS and explained FC would talk with her.    Patient reports no further concerns at this time. CSW is signing off but available if needed.   Assessment/plan status:  No Further Intervention Required Other assessment/ plan:   Information/referral to community resources:   Referral to [employer]. DSS    PATIENT'S/FAMILY'S RESPONSE TO PLAN OF CARE: Patient alert and oriented. Patient agreeable to CSW consult. Patient appreciative of referral and will follow up at dc.

## 2012-12-07 NOTE — Progress Notes (Signed)
PULMONARY  / CRITICAL CARE MEDICINE  Name: Katrina Mitchell MRN: [id number] DOB: November 05, 1981    LOS: 2  REFERRING MD :  Donnetta Hutching  CHIEF COMPLAINT:  Short of breath  BRIEF PATIENT DESCRIPTION:  31 yo female former smoker transferred from [facility] with acute respiratory failure 2nd to asthma exacerbation.  LINES / TUBES: ETT 12/28 >> 12/29 (self extubated)  CULTURES: Influenza 12/29 >>negative  ANTIBIOTICS: Zithromax 12/28 >> Rocephin 12/28 >>12/30  SIGNIFICANT EVENTS:  12/28 VDRF, paralytic  DVT Px:  SQ heparin GI Px: Protonix  INTERVAL HISTORY:  Still has cough, chest tightness.  Feels better.  VITAL SIGNS: Temp:  [98.1 F (36.7 C)-99.2 F (37.3 C)] 98.1 F (36.7 C) (12/30 0813) Pulse Rate:  [66-116] 78  (12/30 1000) Resp:  [13-24] 16  (12/30 1000) BP: (99-135)/(51-87) 103/56 mmHg (12/30 1000) SpO2:  [93 %-100 %] 98 % (12/30 1000) INTAKE / OUTPUT: Intake/Output      12/29 0701 - 12/30 0700 12/30 0701 - 12/31 0700   P.O.  240   I.V. (mL/kg) 1284.7 (14.7) 50 (0.6)   IV Piggyback 50    Total Intake(mL/kg) 1334.7 (15.2) 290 (3.3)   Urine (mL/kg/hr) 391 (0.2)    Total Output 391    Net +943.7 +290          PHYSICAL EXAMINATION: General:  No distress Neuro:  Follows commands HEENT: No sinus tenderness Cardiovascular:  s1s2 regular, no murmur Lungs:  Scattered wheezing Abdomen:  Soft, non-tender Musculoskeletal:  No edema Skin:  No rashes   LABS: Cbc  Lab 12/07/12 0420 12/06/12 0450 12/05/12 1533  WBC 14.3* -- --  HGB 12.5 12.6 14.3  HCT 37.7 38.1 41.7  PLT 322 299 319    Chemistry   Lab 12/07/12 0420 12/06/12 0450 12/05/12 1533  NA 138 136 138  K 3.9 4.4 3.5  CL 104 104 104  CO2 24 24 25   BUN 10 7 7   CREATININE 0.53 0.59 0.56  CALCIUM 8.8 8.9 9.2  MG -- 2.2 --  PHOS -- 3.8 --  GLUCOSE 112* 122* 88   ABG  Lab 12/05/12 2304 12/05/12 1858 12/05/12 1724  PHART 7.336* 7.266* 7.184*  PCO2ART 41.4 49.9* 59.8*  PO2ART 94.0 143.0* 553.0*  HCO3  21.5 22.0 21.7  TCO2 22.8 20.3 20.4    IMAGING: Dg Chest 2 View  12/05/2012  *RADIOLOGY REPORT*  Clinical Data: Endotracheal tube placement  CHEST - 2 VIEW  Comparison: 11/14/2012  Findings: Apparently, two films were obtained as part of this exam. On the first film, the endotracheal tube tip is 1.6 cm above the base of the carina. The lungs are clear without focal infiltrate, edema, pneumothorax or pleural effusion.  On the film labeled #2, there is now in [name] tube evident with the tip overlying the mid stomach.  Defibrillator pads overlie the left chest. Telemetry leads overlie the chest.  IMPRESSION: Endotracheal tube tip is 1.6 cm above the base of the carina.  NG tube tip overlies the mid stomach.   Original Report Authenticated By: Kennith Center, M.D.     ASSESSMENT / PLAN:  Acute respiratory failure 2nd to acute asthma exacerbation. PLAN:   Self-extubated 12/29 Scheduled atrovent/albuterol/pulmicort Change solumedrol 20 mg q12h Continue singulair   Sinus tachycardia related to respiratory failure. Resolved.   Nutrition. GERD. PLAN:   Regular diet Continue protonix  Acute bronchitis. PLAN:   Change zithromax to po D/c rocephin   Acute encephalopathy 2nd to respiratory arrest. Resolved.  Summary: She  will need assistance from social worker to arrange for insurance coverage.  May issues has been inability to afford medications.  Transfer to non-tele floor bed.  Likely d/c home in 24 to 48 hours.  Keep on PCCM service.  Coralyn Helling, MD [facility] Pulmonary/Critical Care 12/07/2012, 11:30 AM Pager:  [phone] After 3pm call: [phone]

## 2012-12-07 NOTE — Care Management Note (Addendum)
    Page 1 of 1   12/08/2012     5:18:41 PM   CARE MANAGEMENT NOTE 12/08/2012  Patient:  Katrina Mitchell, Katrina Mitchell   Account Number:  [id number]  Date Initiated:  12/07/2012  Documentation initiated by:  Katrina Mitchell  Subjective/Objective Assessment:   adm w asthma     Action/Plan:   lives alone   Anticipated DC Date:  12/08/2012   Anticipated DC Plan:  HOME/SELF CARE      DC Planning Services  CM consult  MATCH Program      Choice offered to / List presented to:             Status of service:  Completed, signed off Medicare Important Message given?   (If response is "NO", the following Medicare IM given date fields will be blank) Date Medicare IM given:   Date Additional Medicare IM given:    Discharge Disposition:  HOME/SELF CARE  Per UR Regulation:  Reviewed for med. necessity/level of care/duration of stay  If discussed at Long Length of Stay Meetings, dates discussed:    Comments:  12/08/12 17:17 Katrina Cape RN, BSN [phone] patient needs ast with meidcations, asst with Match program.  Patient has transportation.  Patient for dc today.  12/30 1427 Katrina dowell rn,bsn spoke w pt. no ins but her kids have medicaid. financial co has spoken w pt. gave her prescription card that may assist w brand name meds. she may be elidg for match program at disch.

## 2012-12-08 ENCOUNTER — Telehealth: Payer: Self-pay | Admitting: Internal Medicine

## 2012-12-08 ENCOUNTER — Inpatient Hospital Stay (HOSPITAL_COMMUNITY): Payer: Medicaid Other

## 2012-12-08 MED ORDER — BUDESONIDE-FORMOTEROL FUMARATE 160-4.5 MCG/ACT IN AERO
2.0000 | INHALATION_SPRAY | Freq: Two times a day (BID) | RESPIRATORY_TRACT | Status: DC
  Administered 2012-12-08: 2 via RESPIRATORY_TRACT
  Filled 2012-12-08: qty 6

## 2012-12-08 MED ORDER — ALBUTEROL SULFATE (2.5 MG/3ML) 0.083% IN NEBU: 2.5000 mg | INHALATION_SOLUTION | Freq: Four times a day (QID) | RESPIRATORY_TRACT | Status: DC | PRN

## 2012-12-08 MED ORDER — INFLUENZA VIRUS VACC SPLIT PF IM SUSP
0.5000 mL | INTRAMUSCULAR | Status: AC
  Administered 2012-12-08: 0.5 mL via INTRAMUSCULAR
  Filled 2012-12-08 (×2): qty 0.5

## 2012-12-08 MED ORDER — BUDESONIDE-FORMOTEROL FUMARATE 160-4.5 MCG/ACT IN AERO: 2.0000 | INHALATION_SPRAY | Freq: Two times a day (BID) | RESPIRATORY_TRACT | Status: DC

## 2012-12-08 MED ORDER — FLUTICASONE-SALMETEROL 500-50 MCG/DOSE IN AEPB: 1.0000 | INHALATION_SPRAY | Freq: Two times a day (BID) | RESPIRATORY_TRACT | Status: DC

## 2012-12-08 MED ORDER — TIOTROPIUM BROMIDE MONOHYDRATE 18 MCG IN CAPS
18.0000 ug | ORAL_CAPSULE | Freq: Every day | RESPIRATORY_TRACT | Status: DC
  Administered 2012-12-08: 18 ug via RESPIRATORY_TRACT
  Filled 2012-12-08: qty 5

## 2012-12-08 MED ORDER — TIOTROPIUM BROMIDE MONOHYDRATE 18 MCG IN CAPS: 18.0000 ug | ORAL_CAPSULE | Freq: Every day | RESPIRATORY_TRACT | Status: DC

## 2012-12-08 MED ORDER — AZITHROMYCIN 500 MG PO TABS: 500.0000 mg | ORAL_TABLET | ORAL | Status: DC

## 2012-12-08 MED ORDER — FLUTICASONE PROPIONATE 50 MCG/ACT NA SUSP: 2.0000 | Freq: Every day | NASAL | Status: DC

## 2012-12-08 MED ORDER — PREDNISONE 20 MG PO TABS: ORAL_TABLET | ORAL | Status: DC

## 2012-12-08 MED ORDER — FLUTICASONE PROPIONATE 50 MCG/ACT NA SUSP
2.0000 | Freq: Every day | NASAL | Status: DC
  Filled 2012-12-08: qty 16

## 2012-12-08 MED ORDER — FLUTICASONE-SALMETEROL 500-50 MCG/DOSE IN AEPB: 1.0000 | INHALATION_SPRAY | Freq: Every day | RESPIRATORY_TRACT | Status: DC

## 2012-12-08 MED ORDER — ALBUTEROL SULFATE HFA 108 (90 BASE) MCG/ACT IN AERS: 2.0000 | INHALATION_SPRAY | Freq: Four times a day (QID) | RESPIRATORY_TRACT | Status: DC | PRN

## 2012-12-08 MED ORDER — ALBUTEROL SULFATE HFA 108 (90 BASE) MCG/ACT IN AERS: 1.0000 | INHALATION_SPRAY | Freq: Four times a day (QID) | RESPIRATORY_TRACT | Status: DC | PRN

## 2012-12-08 MED ORDER — PREDNISONE 20 MG PO TABS
40.0000 mg | ORAL_TABLET | Freq: Every day | ORAL | Status: DC
  Filled 2012-12-08: qty 2

## 2012-12-08 MED ORDER — FAMOTIDINE 20 MG PO TABS
20.0000 mg | ORAL_TABLET | Freq: Every day | ORAL | Status: DC
  Filled 2012-12-08: qty 1

## 2012-12-08 MED ORDER — LORATADINE 10 MG PO TABS
10.0000 mg | ORAL_TABLET | Freq: Every day | ORAL | Status: DC
  Administered 2012-12-08: 10 mg via ORAL
  Filled 2012-12-08: qty 1

## 2012-12-08 MED ORDER — FAMOTIDINE 20 MG PO TABS: 20.0000 mg | ORAL_TABLET | Freq: Every day | ORAL | Status: DC

## 2012-12-08 NOTE — Progress Notes (Signed)
PFT completed at bedside. Unconfirmed copy to be placed in Shadow Chart.

## 2012-12-08 NOTE — Telephone Encounter (Signed)
Called and spoke with patient at [facility]-- patient aware that 1 month supply of samples are upfront for her pickup (no nasal steroid in office at this time).  Patient aware that are office closes at 2pm today and will not reopen until Thursday at 8am.  Attempted to schedule follow up appt as req by MR however patient requested to make follow up when she comes to pick up samples (per pt--she had nothing to write with at this time).  Nothing further at this time.  Will forward to MR as Lorain Childes

## 2012-12-08 NOTE — Discharge Summary (Signed)
Physician Discharge Summary  Patient ID: Katrina Mitchell MRN: [id number] DOB/AGE: 1981-05-16 31 y.o.  Admit date: 12/05/2012 Discharge date: 12/08/2012  Problem List Active Problems:  Status asthmaticus  HPI: 31 yo female former smoker transferred from [facility] with acute respiratory failure 2nd to asthma exacerbation. Note baseline asthmatic: had 3rd child 5 months ago. Prior to that compliant on advair and hardly needed albuterol. Then during pregnancy asthma got worse but advair dc'ed by MD reportedly. Since then multiple pred bursts and ER visits prior to current admission. Lost job on 12/05/2012 at admit date due to running out of [facility] Course:  LINES / TUBES:  ETT 12/28 >> 12/29 (self extubated)  CULTURES:  Influenza 12/29 >>negative  ANTIBIOTICS:  Zithromax 12/28 >>  Rocephin 12/28 >>12/30  DVT Px: SQ heparin  GI Px: Protonix  SIGNIFICANT EVENTS:  12/28 VDRF, paralytic  12/07/12: Still has cough, chest tightness. Feels better.    ASSESSMENT / PLAN:  Acute respiratory failure 2nd to acute asthma exacerbation.  Self-extubated 12/29  On 12/08/12 - subjectively ready for dc   LAN  DC solumedrol  Get spirometry and if Fev1 > 40%, dc home  (spirometry was >100%) Due to $ issues, dc singulair  Due to $ issues will need samples from office and care mgmt to work trhough free clinic  - symbicort 160/4.5 2 puff bid (change to ADVAR 500/50 in office) - ADD spiriva 1 puff daily (spriiva rationale basd on [facility] NEJM study from few years ago)  - abluterol prn mdi and nebs  - daily nasal steroid fluticasone 2 squirts each nostril (no sample in office on 12/08/2012, so she has to buy generic/leve with hospital sample)  - daily claritin 10mg  OTC med  - daily pepcid OTC 20mg  QHS  - finish Z pak ( need sample from hospital)  - pred taper over 2 wweeks starting 12/08/2012 (she will need to buy this I think)  - Please take Take prednisone 40mg  once daily x 3 days, then 30mg  once  daily x 3 days, then 20mg  once daily x 3 days, then prednisone 10mg  once daily x 3 days and then prednisone 5mg  daily x 3 days and stop  - OPD fu Dr Marchelle Gearing or NP < 7 - 10 days (when office called her to give appt, she said she had nothing to write down)  Sinus tachycardia related to respiratory failure.  Resolved.  Nutrition.  GERD.  PLAN:  Regular diet  pepcid  Acute bronchitis.  PLAN:  zithromax to po  Acute encephalopathy 2nd to respiratory arrest.  Resolved.  DENTAL CARIES  - will need to see dentist at some point  Summary:  She will need assistance from social worker to arrange for insurance coverage. Main issues has been inability to afford medications.    Labs at discharge Lab Results  Component Value Date   CREATININE 0.53 12/07/2012   BUN 10 12/07/2012   NA 138 12/07/2012   K 3.9 12/07/2012   CL 104 12/07/2012   CO2 24 12/07/2012   Lab Results  Component Value Date   WBC 14.3* 12/07/2012   HGB 12.5 12/07/2012   HCT 37.7 12/07/2012   MCV 79.4 12/07/2012   PLT 322 12/07/2012   Lab Results  Component Value Date   ALT 20 11/10/2012   AST 26 11/10/2012   ALKPHOS 70 11/10/2012   BILITOT 0.3 11/10/2012   No results found for this basename: INR, PROTIME    Current radiology studies No results  found.  Disposition:  01-Home or Self Care  Discharge Orders    Future Appointments: Provider: Department: Dept Phone: Center:   12/15/2012 9:15 AM Julio Sicks, NP [facility] [phone] None     Future Orders Please Complete By Expires   Discharge patient      Comments:   After spirometry completed       Medication List     As of 12/08/2012  3:04 PM    STOP taking these medications         ipratropium 17 MCG/ACT inhaler   Commonly known as: ATROVENT HFA      ipratropium-albuterol 0.5-2.5 (3) MG/3ML Soln   Commonly known as: DUONEB      TAKE these medications         albuterol 108 (90 BASE) MCG/ACT inhaler   Commonly known as:  PROVENTIL HFA;VENTOLIN HFA   Inhale 1-2 puffs into the lungs every 6 (six) hours as needed for wheezing.      albuterol (2.5 MG/3ML) 0.083% nebulizer solution   Commonly known as: PROVENTIL   Take 3 mLs (2.5 mg total) by nebulization every 6 (six) hours as needed for wheezing.      azithromycin 500 MG tablet   Commonly known as: ZITHROMAX   Take 1 tablet (500 mg total) by mouth daily.      budesonide-formoterol 160-4.5 MCG/ACT inhaler   Commonly known as: SYMBICORT   Inhale 2 puffs into the lungs 2 (two) times daily.      famotidine 20 MG tablet   Commonly known as: PEPCID   Take 1 tablet (20 mg total) by mouth at bedtime.      fluticasone 50 MCG/ACT nasal spray   Commonly known as: FLONASE   Place 2 sprays into the nose daily.      lansoprazole 30 MG capsule   Commonly known as: PREVACID   Take 30 mg by mouth daily.      predniSONE 20 MG tablet   Commonly known as: DELTASONE   Take 4 tabs  daily with food x 4 days, then 3 tabs daily x 4 days, then 2 tabs daily x 4 days, then 1 tab daily x4 days then stop. #40      tiotropium 18 MCG inhalation capsule   Commonly known as: SPIRIVA   Place 1 capsule (18 mcg total) into inhaler and inhale daily.           Follow-up Information    Follow up with PARRETT,TAMMY, NP. On 12/15/2012. (9:15 am)    Contact information:   [facility] [address] [place] [address] [phone]          Discharged Condition: {GOOD Vital signs at Discharge. Temp:  [97.5 F (36.4 C)-98.5 F (36.9 C)] 98.5 F (36.9 C) (12/31 1444) Pulse Rate:  [71-102] 102  (12/31 1444) Resp:  [16-20] 16  (12/31 1444) BP: (112-153)/(71-86) 153/86 mmHg (12/31 1444) SpO2:  [96 %-98 %] 98 % (12/31 1444) Weight:  [80.7 kg (177 lb 14.6 oz)] 80.7 kg (177 lb 14.6 oz) (12/31 1308) Office follow up Special Information or instructions. She has office follow up planned. She does not have financial resources for purchase of medications.   Signed: Brett Canales Minor ACNP [facility] PCCM Pager [phone] till 3 pm If no answer page [phone] 12/08/2012, 3:04 PM    STAFF NOTE - see my progress note for dc summary plan and np notes. > 35 min in dc planning

## 2012-12-08 NOTE — Telephone Encounter (Signed)
Triage  This patient is likely dc today. She has no $ and uninsured. She is likely to stop by and pick up samples 12/08/2012 before 17.30  Please have 1 month supply (not 1 mdi but 1 month supply)   - advair 500/50 at 1 puff bid  - spiriva 1 puff daily  - any nasal steroid 2 squirts each nostril daily  - give fu to see me or TP in < 7-10 days  MR

## 2012-12-08 NOTE — Progress Notes (Addendum)
PULMONARY  / CRITICAL CARE MEDICINE  Name: Katrina Mitchell MRN: [id number] DOB: 05-04-1981    LOS: 3  REFERRING MD :  Donnetta Hutching  CHIEF COMPLAINT:  Short of breath  BRIEF PATIENT DESCRIPTION:  31 yo female former smoker transferred from [facility] with acute respiratory failure 2nd to asthma exacerbation. Note baseline asthmatic: had 3rd child 5 months ago. Prior to that compliant on advair and hardly needed albuterol. Then during pregnancy asthma got worse but advair dc'ed by MD reportedly. Since then multiple pred bursts and ER visits prior to current admission. Lost job on 12/05/2012 at admit date due to running out of FMLA   LINES / TUBES: ETT 12/28 >> 12/29 (self extubated)  CULTURES: Influenza 12/29 >>negative  ANTIBIOTICS: Zithromax 12/28 >> Rocephin 12/28 >>12/30   DVT Px:  SQ heparin GI Px: Protonix   SIGNIFICANT EVENTS:  12/28 VDRF, paralytic 12/07/12: Still has cough, chest tightness.  Feels better.    SUBJECTIVE/OVERNIGHT/INTERVAL HX 12/08/12  - feels 90% of baselien. Feels well enough to go home. Some sore throat due to self extubation. Says no money to even buy any MDI. Willing to see [place] in clinic in [place]  VITAL SIGNS: Temp:  [97.5 F (36.4 C)-98.7 F (37.1 C)] 97.9 F (36.6 C) (12/31 0644) Pulse Rate:  [71-100] 71  (12/31 0644) Resp:  [15-23] 16  (12/31 0644) BP: (112-128)/(63-86) 115/71 mmHg (12/31 0644) SpO2:  [96 %-98 %] 96 % (12/31 0900) Weight:  [80.7 kg (177 lb 14.6 oz)] 80.7 kg (177 lb 14.6 oz) (12/31 0644) INTAKE / OUTPUT: Intake/Output      12/30 0701 - 12/31 0700 12/31 0701 - 01/01 0700   P.O. 240 240   I.V. (mL/kg) 50 (0.6)    IV Piggyback     Total Intake(mL/kg) 290 (3.6) 240 (3)   Urine (mL/kg/hr)     Total Output     Net +290 +240        Urine Occurrence 2 x    Stool Occurrence 1 x      PHYSICAL EXAMINATION: General:  No distress Neuro:  Follows commands HEENT: No sinus tenderness. DENTAL CARIES + Cardiovascular:  s1s2 regular, no  murmur Lungs:  Scattered wheezing but no distress. Speaks full sentences. No acc muscle use Abdomen:  Soft, non-tender Musculoskeletal:  No edema Skin:  No rashes. Tattoo on back left   LABS: Cbc  Lab 12/07/12 0420 12/06/12 0450 12/05/12 1533  WBC 14.3* -- --  HGB 12.5 12.6 14.3  HCT 37.7 38.1 41.7  PLT 322 299 319    Chemistry   Lab 12/07/12 0420 12/06/12 0450 12/05/12 1533  NA 138 136 138  K 3.9 4.4 3.5  CL 104 104 104  CO2 24 24 25   BUN 10 7 7   CREATININE 0.53 0.59 0.56  CALCIUM 8.8 8.9 9.2  MG -- 2.2 --  PHOS -- 3.8 --  GLUCOSE 112* 122* 88   ABG  Lab 12/05/12 2304 12/05/12 1858 12/05/12 1724  PHART 7.336* 7.266* 7.184*  PCO2ART 41.4 49.9* 59.8*  PO2ART 94.0 143.0* 553.0*  HCO3 21.5 22.0 21.7  TCO2 22.8 20.3 20.4    IMAGING: No results found.  ASSESSMENT / PLAN:  Acute respiratory failure 2nd to acute asthma exacerbation. Self-extubated 12/29 On 12/08/12 - subjectively ready for dc  PLAN DC solumedrol Get spirometry and if Fev1 > 40%, dc home Due to $ issues, dc singulair Due to $ issues will need samples from office and care mgmt to work trhough  free clinic   - symbicort 160/4.5 2 puff bid and spiriva 1 puff daily (spriiva rationale basd on [facility] NEJM study from few years ago)  - abluterol prn mdi and nebs  - daily nasal steroid fluticasone 2 squirts each nostril (no sample in office on 12/08/2012, so she has to buy generic/leve with hospital sample)  - daily claritin 10mg  OTC med  - daily pepcid OTC 20mg  QHS  - finish Z pak ( need sample from hospital)  - pred taper over 2 wweeks starting 12/08/2012 (she will need to buy this I think)   - Please take Take prednisone 40mg  once daily x 3 days, then 30mg  once daily x 3 days, then 20mg  once daily x 3 days, then prednisone 10mg  once daily  x 3 days and then prednisone 5mg  daily x 3 days and  stop     - OPD fu Dr Marchelle Gearing or NP < 7 - 10 days (when office called her to give appt, she said she had  nothing to write down)   Sinus tachycardia related to respiratory failure. Resolved.   Nutrition. GERD. PLAN:   Regular diet pepcid  Acute bronchitis. PLAN:    zithromax to po    Acute encephalopathy 2nd to respiratory arrest. Resolved.  DENTAL CARIES  - will need to see dentist at some point  Summary: She will need assistance from social worker to arrange for insurance coverage.  Main issues has been inability to afford medications.   Dr. Kalman Shan, M.D., [facility].C.P Pulmonary and Critical Care Medicine Staff Physician [facility] [place] [facility] Pager: [phone], If no answer or between  15:00h - 7:00h: call [phone]  12/08/2012 12:05 PM   If discharged 12/08/2012 > 35 min dc plannign

## 2012-12-08 NOTE — Progress Notes (Signed)
Katrina Mitchell discharged Home per MD order.  Discharge instructions reviewed and discussed with the patient, all questions and concerns answered. Copy of instructions and scripts given to patient.  Explained how to sign up for Mychart.   [name], [name]  Home Medication Instructions AVW:[id number]   Printed on:12/08/12 1524  Medication Information                    lansoprazole (PREVACID) 30 MG capsule Take 30 mg by mouth daily.           albuterol (PROVENTIL HFA;VENTOLIN HFA) 108 (90 BASE) MCG/ACT inhaler Inhale 1-2 puffs into the lungs every 6 (six) hours as needed for wheezing.           albuterol (PROVENTIL) (2.5 MG/3ML) 0.083% nebulizer solution Take 3 mLs (2.5 mg total) by nebulization every 6 (six) hours as needed for wheezing.           azithromycin (ZITHROMAX) 500 MG tablet Take 1 tablet (500 mg total) by mouth daily.           predniSONE (DELTASONE) 20 MG tablet Take 4 tabs  daily with food x 4 days, then 3 tabs daily x 4 days, then 2 tabs daily x 4 days, then 1 tab daily x4 days then stop. #40           tiotropium (SPIRIVA HANDIHALER) 18 MCG inhalation capsule Place 1 capsule (18 mcg total) into inhaler and inhale daily.           budesonide-formoterol (SYMBICORT) 160-4.5 MCG/ACT inhaler Inhale 2 puffs into the lungs 2 (two) times daily.           famotidine (PEPCID) 20 MG tablet Take 1 tablet (20 mg total) by mouth at bedtime.           fluticasone (FLONASE) 50 MCG/ACT nasal spray Place 2 sprays into the nose daily.             Patients skin is clean, dry and intact, no evidence of skin break down. IV site discontinued and catheter remains intact. Site without signs and symptoms of complications. Dressing and pressure applied.  Patient escorted to car by NT in a wheelchair,  no distress noted upon discharge.  [name], [name] 12/08/2012 3:24 PM

## 2012-12-15 ENCOUNTER — Inpatient Hospital Stay: Payer: Self-pay | Admitting: Adult Health

## 2012-12-18 ENCOUNTER — Ambulatory Visit (INDEPENDENT_AMBULATORY_CARE_PROVIDER_SITE_OTHER): Payer: Self-pay | Admitting: Adult Health

## 2012-12-18 ENCOUNTER — Encounter: Payer: Self-pay | Admitting: Adult Health

## 2012-12-18 VITALS — HR 64 | Temp 98.3°F | Ht 67.0 in | Wt 194.0 lb

## 2012-12-18 DIAGNOSIS — J45902 Unspecified asthma with status asthmaticus: Secondary | ICD-10-CM

## 2012-12-18 MED ORDER — TIOTROPIUM BROMIDE MONOHYDRATE 18 MCG IN CAPS: 18.0000 ug | ORAL_CAPSULE | Freq: Every day | RESPIRATORY_TRACT | Status: DC

## 2012-12-18 MED ORDER — PREDNISONE 10 MG PO TABS: ORAL_TABLET | ORAL | Status: DC

## 2012-12-18 MED ORDER — BUDESONIDE-FORMOTEROL FUMARATE 160-4.5 MCG/ACT IN AERO: 2.0000 | INHALATION_SPRAY | Freq: Two times a day (BID) | RESPIRATORY_TRACT | Status: DC

## 2012-12-18 MED ORDER — AMOXICILLIN 500 MG PO CAPS: 500.0000 mg | ORAL_CAPSULE | Freq: Three times a day (TID) | ORAL | Status: DC

## 2012-12-18 NOTE — Patient Instructions (Addendum)
Taper Prednisone 40mg  daily 2 more days , 30mg  daily for 3 days  Then 20mg  daily for 3 days then 10mg  daily for 3 days then  5mg  daily for 3 days  and stop  Amoxicillin 500mg  Three times a day  For 7 days  Take Symbicort 2 puffs Twice daily  , brush/rinse and gargle after use.  Continue on Spiriva daily  Look in to dental work .  Follow up Dr. Marchelle Gearing in 2-3 weeks with PFT  Complete Drug assistance paperwork  Please contact office for sooner follow up if symptoms do not improve or worsen or seek emergency care

## 2012-12-18 NOTE — Assessment & Plan Note (Addendum)
Reason severe, status asthmaticus, with severe exacerbation, requiring ventilator support. Patient is encouraged on stopping marijuana use. She is also encouraged on medication compliance. She was given samples of Symbicort, and Spiriva to last approximately one month. She was also given patient assistance program medication. Paperwork. She is currently in the process of seeking Medicaid insurance. She has also been instructed to find some type of dental care if possible. She has multiple dental caries and broken teeth. Today. She has a gum abscess, that she's been given antibiotic coverage for her. Wean slowly off steroids   Plan  Taper Prednisone 40mg  daily 2 more days , 30mg  daily for 3 days  Then 20mg  daily for 3 days then 10mg  daily for 3 days then  5mg  daily for 3 days  and stop  Amoxicillin 500mg  Three times a day  For 7 days  Take Symbicort 2 puffs Twice daily  , brush/rinse and gargle after use.  Continue on Spiriva daily  Look in to dental work .  Follow up Dr. Marchelle Gearing in 2-3 weeks with PFT  Complete Drug assistance paperwork  Please contact office for sooner follow up if symptoms do not improve or worsen or seek emergency care

## 2012-12-18 NOTE — Progress Notes (Signed)
  Subjective:    Patient ID: Katrina Mitchell, female    DOB: 08/09/1981, 32 y.o.   MRN: [id number]  HPI 32 year old female seen for initial pulmonary consult during hospitalization. December 28 for status asthmaticus requiring mechanical ventilation.  12/18/2012 Post Hospital follow up  Patient was admitted December 28 through the 31st for status asthmaticus, and she was treated IV antibiotics, steroids, and initial mechanical ventilation. She was discharged on Symbicort, and Spiriva. We will do steroid taper She does not have any insurance and is unemployed at the present. She is currently on prednisone 40 mg daily. She is improved, however, continues to have intermittent shortness, of breath and wheezing on and off. She uses her albuterol most days. We discussed rescue versus maintenance use, and have given her samples today along with the patient assistance program forms. She is also advised or asthma, triggers and is encouraged on discontinuing marijuana use. She denies any hemoptysis, fever, purulent sputum.or edema.  She does have significant dental issues and has been noticing a small abscess on the lower gum line.   Review of Systems Constitutional:   No  weight loss, night sweats,  Fevers, chills, fatigue, or  lassitude.  HEENT:   No headaches,  Difficulty swallowing,   or  Sore throat,                No sneezing, itching, ear ache, nasal congestion, post nasal drip,   CV:  No chest pain,  Orthopnea, PND, swelling in lower extremities, anasarca, dizziness, palpitations, syncope.   GI  No heartburn, indigestion, abdominal pain, nausea, vomiting, diarrhea, change in bowel habits, loss of appetite, bloody stools.   Resp: No coughing up of blood.   No chest wall deformity  Skin: no rash or lesions.  GU: no dysuria, change in color of urine, no urgency or frequency.  No flank pain, no hematuria   MS:  No joint pain or swelling.  No decreased range of motion.  No back pain.  Psych:   No change in mood or affect. No depression or anxiety.  No memory loss.         Objective:   Physical Exam GEN: A/Ox3; pleasant , NAD  HEENT:  [place]/AT,  EACs-clear, TMs-wnl, NOSE-clear, THROAT-clear, no lesions, no postnasal drip or exudate noted. Multiple dental caries , broken teeth and small blister along left lower gum line.   NECK:  Supple w/ fair ROM; no JVD; normal carotid impulses w/o bruits; no thyromegaly or nodules palpated; no lymphadenopathy.  RESP  Clear  P & A; w/o, wheezes/ rales/ or rhonchi.no accessory muscle use, no dullness to percussion  CARD:  RRR, no m/r/g  , no peripheral edema, pulses intact, no cyanosis or clubbing.  GI:   Soft & nt; nml bowel sounds; no organomegaly or masses detected.  Musco: Warm bil, no deformities or joint swelling noted.   Neuro: alert, no focal deficits noted.    Skin: Warm, no lesions or rashes         Assessment & Plan:

## 2013-01-14 ENCOUNTER — Encounter: Payer: Self-pay | Admitting: Internal Medicine

## 2013-01-14 ENCOUNTER — Ambulatory Visit (INDEPENDENT_AMBULATORY_CARE_PROVIDER_SITE_OTHER): Payer: Self-pay | Admitting: Internal Medicine

## 2013-01-14 VITALS — BP 102/70 | HR 107 | Temp 98.0°F | Ht 67.0 in | Wt 195.0 lb

## 2013-01-14 DIAGNOSIS — J4540 Moderate persistent asthma, uncomplicated: Secondary | ICD-10-CM

## 2013-01-14 DIAGNOSIS — J45909 Unspecified asthma, uncomplicated: Secondary | ICD-10-CM | POA: Insufficient documentation

## 2013-01-14 LAB — PULMONARY FUNCTION TEST

## 2013-01-14 NOTE — Progress Notes (Signed)
Subjective:    Patient ID: Katrina Mitchell, female    DOB: [birth year], 32 y.o.   MRN: [id number]  HPI 32 year old female seen for initial pulmonary consult during hospitalization. December 28 for status asthmaticus requiring mechanical ventilation.  12/18/2012 Post Hospital follow up 1 Patient was admitted December 28 through the 31st for status asthmaticus, and she was treated IV antibiotics, steroids, and initial mechanical ventilation. She was discharged on Symbicort, and Spiriva. We will do steroid taper She does not have any insurance and is unemployed at the present. She is currently on prednisone 40 mg daily. She is improved, however, continues to have intermittent shortness, of breath and wheezing on and off. She uses her albuterol most days. We discussed rescue versus maintenance use, and have given her samples today along with the patient assistance program forms. She is also advised or asthma, triggers and is encouraged on discontinuing marijuana use. She denies any hemoptysis, fever, purulent sputum.or edema.  She does have significant dental issues and has been noticing a small abscess on the lower gum line.   REC Taper Prednisone 40mg  daily 2 more days , 30mg  daily for 3 days  Then 20mg  daily for 3 days then 10mg  daily for 3 days then 5mg  daily for 3 days and stop  Amoxicillin 500mg  Three times a day For 7 days  Take Symbicort 2 puffs Twice daily , brush/rinse and gargle after use.  Continue on Spiriva daily  Look in to dental work .  Follow up Dr. Marchelle Mitchell in 2-3 weeks with PFT  Complete Drug assistance paperwork  Please contact office for sooner follow up if symptoms do not improve or worsen or seek emergency care    OV 01/14/2013  High risk asthma followup  - She's doing really well. Her albuterol rescue use is less than 2 times a week. She is more than 80% compliant with her Spiriva and Symbicort samples with a spacer device. She occasionally forgets to take his  inhalers because she is tired with her baby and falls asleep. This is because the inhalers out of reach in her handbag.suggested she keep it at the headboard the bedor in the bathroom. She denies any history of nocturnal awakenings, chest tightness, wheeze or shortness of breath - elicited vaccine history she received her pneumonia vaccine and pertussis vaccine in 2013. She's up-to-date with her flu shot - pulmonary function test today is reviewed and they're normal with FEV1 of 3.97 L/122% predicted and a FEV1 FVC ratio 76. There is 8% BD  response in FEV1. Total lung capacity and DLCO normal  Past, Family, Social reviewed: She still unemployed and she is dependent on samples from [place]. She's applying for Medicaid. She still has ongoing issues with her dental abscess and once she gets Medicaid she will seek dentist.   Review of Systems  Constitutional: Negative for fever and unexpected weight change.  HENT: Negative for ear pain, nosebleeds, congestion, sore throat, rhinorrhea, sneezing, trouble swallowing, dental problem, postnasal drip and sinus pressure.   Eyes: Negative for redness and itching.  Respiratory: Negative for cough, chest tightness, shortness of breath and wheezing.   Cardiovascular: Negative for palpitations and leg swelling.  Gastrointestinal: Negative for nausea and vomiting.  Genitourinary: Negative for dysuria.  Musculoskeletal: Negative for joint swelling.  Skin: Negative for rash.  Neurological: Negative for headaches.  Hematological: Does not bruise/bleed easily.  Psychiatric/Behavioral: Negative for dysphoric mood. The patient is not nervous/anxious.        Objective:   Physical  Exam GEN: A/Ox3; pleasant , NAD  HEENT:  [place]/AT,  EACs-clear, TMs-wnl, NOSE-clear, THROAT-clear, no lesions, no postnasal drip or exudate noted. Multiple dental caries , broken teeth and small blister along left lower gum line.   NECK:  Supple w/ fair ROM; no JVD; normal carotid impulses  w/o bruits; no thyromegaly or nodules palpated; no lymphadenopathy.  RESP  Clear  P & A; w/o, wheezes/ rales/ or rhonchi.no accessory muscle use, no dullness to percussion  CARD:  RRR, no m/r/g  , no peripheral edema, pulses intact, no cyanosis or clubbing.  GI:   Soft & nt; nml bowel sounds; no organomegaly or masses detected.  Musco: Warm bil, no deformities or joint swelling noted.   Neuro: alert, no focal deficits noted.    Skin: Warm, no lesions or rashes         Assessment & Plan:

## 2013-01-14 NOTE — Progress Notes (Signed)
PFT done today. 

## 2013-01-14 NOTE — Assessment & Plan Note (Signed)
Glad you're doing better terms of asthma which I think is currently well controlled Stop Spiriva Once his Symbicort samples run out switch over to high-dose Dulera 2 puff 2 times a day  - given  three-month supply use with a spacer device  - keep the Dulera in the bathroom or on the headboard a few bedroom so they do not forget to take it at all  - 100% compliance is important  Use albuterol only as needed if using it more than several times a week please call us Glad you're up-to-date with all her vaccines; you need your pertussis and pneumonia vaccine at least every 7 years Return to see me in 4 months or sooner if needed

## 2013-01-14 NOTE — Patient Instructions (Addendum)
Glad you're doing better terms of asthma which I think is currently well controlled Stop Spiriva Once  Symbicort samples run out switch over to high-dose Dulera 2 puff 2 times a day  - given  three-month supply use with a spacer device  - keep the Dulera in the bathroom or on the headboard a few bedroom so they do not forget to take it at all  - 100% compliance is important  Use albuterol only as needed if using it more than several times a week please call us Glad you're up-to-date with all her vaccines; you need your pertussis and pneumonia vaccine at least every 7 years Return to see me in 4 months or sooner if needed

## 2013-01-25 ENCOUNTER — Encounter: Payer: Self-pay | Admitting: Internal Medicine

## 2013-05-05 ENCOUNTER — Telehealth: Payer: Self-pay | Admitting: Internal Medicine

## 2013-05-05 NOTE — Telephone Encounter (Signed)
Called pt to schd follow up apt. Left message x3. Sent Letter 05/05/13 °

## 2013-05-12 ENCOUNTER — Telehealth: Payer: Self-pay | Admitting: Internal Medicine

## 2013-05-12 MED ORDER — MOMETASONE FURO-FORMOTEROL FUM 200-5 MCG/ACT IN AERO: 2.0000 | INHALATION_SPRAY | Freq: Two times a day (BID) | RESPIRATORY_TRACT | Status: DC

## 2013-05-12 NOTE — Telephone Encounter (Signed)
Patient Instructions    Glad you're doing better terms of asthma which I think is currently well controlled  Stop Spiriva  Once Symbicort samples run out switch over to high-dose Dulera 2 puff 2 times a day  - given three-month supply use with a spacer device  - keep the Dulera in the bathroom or on the headboard a few bedroom so they do not forget to take it at all  - 100% compliance is important  Use albuterol only as needed if using it more than several times a week please call us  Glad you're up-to-date with all her vaccines; you need your pertussis and pneumonia vaccine at least every 7 years  Return to see me in 4 months or sooner if needed   Pt aware rx sent. Nothing further was neded

## 2013-05-27 ENCOUNTER — Other Ambulatory Visit (HOSPITAL_COMMUNITY): Payer: Self-pay | Admitting: *Deleted

## 2013-05-27 DIAGNOSIS — N6320 Unspecified lump in the left breast, unspecified quadrant: Secondary | ICD-10-CM

## 2013-06-09 ENCOUNTER — Encounter (HOSPITAL_COMMUNITY): Payer: Medicaid Other

## 2013-06-15 ENCOUNTER — Encounter: Payer: Self-pay | Admitting: Internal Medicine

## 2013-06-15 ENCOUNTER — Ambulatory Visit (INDEPENDENT_AMBULATORY_CARE_PROVIDER_SITE_OTHER): Payer: Medicaid Other | Admitting: Internal Medicine

## 2013-06-15 VITALS — BP 120/80 | HR 80 | Temp 98.9°F | Ht 67.0 in | Wt 197.8 lb

## 2013-06-15 DIAGNOSIS — J45909 Unspecified asthma, uncomplicated: Secondary | ICD-10-CM

## 2013-06-15 DIAGNOSIS — J4540 Moderate persistent asthma, uncomplicated: Secondary | ICD-10-CM

## 2013-06-15 MED ORDER — LANSOPRAZOLE 30 MG PO CPDR: 30.0000 mg | DELAYED_RELEASE_CAPSULE | Freq: Every day | ORAL | Status: DC

## 2013-06-15 MED ORDER — FLUTICASONE PROPIONATE 50 MCG/ACT NA SUSP: 2.0000 | Freq: Every day | NASAL | Status: DC

## 2013-06-15 NOTE — Progress Notes (Signed)
Subjective:    Patient ID: Katrina Mitchell, female    DOB: [birth year], 32 y.o.   MRN: [id number]  HPI HPI 32 year old female seen for initial pulmonary consult during hospitalization. December 28 for status asthmaticus requiring mechanical ventilation.  12/18/2012 Post Hospital follow up 1 Patient was admitted December 28 through the 31st for status asthmaticus, and she was treated IV antibiotics, steroids, and initial mechanical ventilation. She was discharged on Symbicort, and Spiriva. We will do steroid taper She does not have any insurance and is unemployed at the present. She is currently on prednisone 40 mg daily. She is improved, however, continues to have intermittent shortness, of breath and wheezing on and off. She uses her albuterol most days. We discussed rescue versus maintenance use, and have given her samples today along with the patient assistance program forms. She is also advised or asthma, triggers and is encouraged on discontinuing marijuana use. She denies any hemoptysis, fever, purulent sputum.or edema.  She does have significant dental issues and has been noticing a small abscess on the lower gum line.   REC Taper Prednisone 40mg  daily 2 more days , 30mg  daily for 3 days  Then 20mg  daily for 3 days then 10mg  daily for 3 days then 5mg  daily for 3 days and stop  Amoxicillin 500mg  Three times a day For 7 days  Take Symbicort 2 puffs Twice daily , brush/rinse and gargle after use.  Continue on Spiriva daily  Look in to dental work .  Follow up Dr. Marchelle Gearing in 2-3 weeks with PFT  Complete Drug assistance paperwork  Please contact office for sooner follow up if symptoms do not improve or worsen or seek emergency care    OV 01/14/2013  High risk asthma followup  - She's doing really well. Her albuterol rescue use is less than 2 times a week. She is more than 80% compliant with her Spiriva and Symbicort samples with a spacer device. She occasionally forgets to take his  inhalers because she is tired with her baby and falls asleep. This is because the inhalers out of reach in her handbag.suggested she keep it at the headboard the bedor in the bathroom. She denies any history of nocturnal awakenings, chest tightness, wheeze or shortness of breath - elicited vaccine history she received her pneumonia vaccine and pertussis vaccine in 2013. She's up-to-date with her flu shot - pulmonary function test today is reviewed and they're normal with FEV1 of 3.97 L/122% predicted and a FEV1 FVC ratio 76. There is 8% BD  response in FEV1. Total lung capacity and DLCO normal  Past, Family, Social reviewed: She still unemployed and she is dependent on samples from [place]. She's applying for Medicaid. She still has ongoing issues with her dental abscess and once she gets Medicaid she will seek dentist.   REC  Glad you're doing better terms of asthma which I think is currently well controlled Stop Spiriva Once  Symbicort samples run out switch over to high-dose Dulera 2 puff 2 times a day  - given  three-month supply use with a spacer device  - keep the Dulera in the bathroom or on the headboard a few bedroom so they do not forget to take it at all  - 100% compliance is important  Use albuterol only as needed if using it more than several times a week please call us Glad you're up-to-date with all her vaccines; you need your pertussis and pneumonia vaccine at least every 7 years Return to see  me in 4 months or sooner if needed  OV 06/15/2013  High risk asthma followup  - Doing well. Albuterol rescue use is 1 per week. No nocturnal awakening. No chest tightness or wheeze or cough, or dyspnea. No edema. Says she is compliant with dulera 200/5 at 2 puff bid. Now has medicaid and can afford mdi. Having post nasal drip but not on nasal steroids  Past, Family, Social reviewed: diagnosed with trichomonas vaginosis. Now has medicaid; dulera costs only  $3 for her  Review of Systems   Constitutional: Negative for fever and unexpected weight change.  HENT: Negative for ear pain, nosebleeds, congestion, sore throat, rhinorrhea, sneezing, trouble swallowing, dental problem, postnasal drip and sinus pressure.   Eyes: Negative for redness and itching.  Respiratory: Negative for cough, chest tightness, shortness of breath and wheezing.   Cardiovascular: Negative for palpitations and leg swelling.  Gastrointestinal: Negative for nausea and vomiting.  Genitourinary: Negative for dysuria.  Musculoskeletal: Negative for joint swelling.  Skin: Negative for rash.  Neurological: Negative for headaches.  Hematological: Does not bruise/bleed easily.  Psychiatric/Behavioral: Negative for dysphoric mood. The patient is not nervous/anxious.    Current outpatient prescriptions:acetaminophen (TYLENOL) 325 MG tablet, Per bottle, Disp: , Rfl: ;  albuterol (PROVENTIL HFA;VENTOLIN HFA) 108 (90 BASE) MCG/ACT inhaler, Inhale 1-2 puffs into the lungs every 6 (six) hours as needed for wheezing., Disp: 1 Inhaler, Rfl: 2;  albuterol (PROVENTIL) (2.5 MG/3ML) 0.083% nebulizer solution, Take 3 mLs (2.5 mg total) by nebulization every 6 (six) hours as needed for wheezing., Disp: 75 mL, Rfl: 2 famotidine (PEPCID) 20 MG tablet, Take 1 tablet (20 mg total) by mouth at bedtime., Disp: , Rfl: ;  fluticasone (FLONASE) 50 MCG/ACT nasal spray, Place 2 sprays into the nose daily., Disp: , Rfl: ;  ibuprofen (ADVIL,MOTRIN) 200 MG tablet, Per bottle, Disp: , Rfl: ;  lansoprazole (PREVACID) 30 MG capsule, Take 30 mg by mouth daily., Disp: , Rfl: ;  loratadine (CLARITIN) 10 MG tablet, Take 10 mg by mouth daily., Disp: , Rfl:  mometasone-formoterol (DULERA) 200-5 MCG/ACT AERO, Inhale 2 puffs into the lungs 2 (two) times daily., Disp: 1 Inhaler, Rfl: 3     Objective:   Physical Exam  Physical Exam GEN: A/Ox3; pleasant , NAD  HEENT:  [place]/AT,  EACs-clear, TMs-wnl, NOSE-clear, THROAT-clear, no lesions, no postnasal drip or  exudate noted.   NECK:  Supple w/ fair ROM; no JVD; normal carotid impulses w/o bruits; no thyromegaly or nodules palpated; no lymphadenopathy.  RESP  Clear  P & A; w/o, wheezes/ rales/ or rhonchi.no accessory muscle use, no dullness to percussion  CARD:  RRR, no m/r/g  , no peripheral edema, pulses intact, no cyanosis or clubbing.  GI:   Soft & nt; nml bowel sounds; no organomegaly or masses detected.  Musco: Warm bil, no deformities or joint swelling noted.   Neuro: alert, no focal deficits noted.    Skin: Warm, no lesions or rashes           Assessment & Plan:

## 2013-06-15 NOTE — Assessment & Plan Note (Signed)
Glad you're doing better terms of asthma which I think is currently well controlled Change dulera to 100/5 but still take it 2 puff twice daily  - keep the [facility] in the bathroom or on the headboard a few bedroom so they do not forget to take it at all  - 100% compliance is important  Use nasal steroid daily 2 squirts each nostril daily Use albuterol only as needed if using it more than several times a week please call us Glad you're up-to-date with all her vaccines; you need your pertussis and pneumonia vaccine at least every 7 years Have flu shot in fall Return to see me in 4 months or sooner if needed Spirometry at followup

## 2013-06-15 NOTE — Patient Instructions (Addendum)
Glad you're doing better terms of asthma which I think is currently well controlled Change dulera to 100/5 but still take it 2 puff twice daily  - keep the Dulera in the bathroom or on the headboard a few bedroom so they do not forget to take it at all  - 100% compliance is important  Use nasal steroid daily 2 squirts each nostril daily Use albuterol only as needed if using it more than several times a week please call us Glad you're up-to-date with all her vaccines; you need your pertussis and pneumonia vaccine at least every 7 years Have flu shot in fall Return to see me in 4 months or sooner if needed Spirometry at followup  

## 2013-06-16 ENCOUNTER — Ambulatory Visit (HOSPITAL_COMMUNITY)
Admission: RE | Admit: 2013-06-16 | Discharge: 2013-06-16 | Disposition: A | Discharge status: Home / Self Care | Payer: Medicaid Other | Source: Ambulatory Visit | Attending: *Deleted | Admitting: *Deleted

## 2013-06-16 ENCOUNTER — Other Ambulatory Visit (HOSPITAL_COMMUNITY): Payer: Self-pay | Admitting: *Deleted

## 2013-06-16 DIAGNOSIS — N6320 Unspecified lump in the left breast, unspecified quadrant: Secondary | ICD-10-CM

## 2013-06-16 DIAGNOSIS — N63 Unspecified lump in unspecified breast: Secondary | ICD-10-CM | POA: Insufficient documentation

## 2013-06-28 ENCOUNTER — Ambulatory Visit: Payer: Medicaid Other | Admitting: Internal Medicine

## 2013-07-21 ENCOUNTER — Telehealth: Payer: Self-pay | Admitting: Internal Medicine

## 2013-07-21 MED ORDER — MOMETASONE FURO-FORMOTEROL FUM 100-5 MCG/ACT IN AERO: 2.0000 | INHALATION_SPRAY | Freq: Two times a day (BID) | RESPIRATORY_TRACT | Status: DC

## 2013-07-21 NOTE — Telephone Encounter (Signed)
Per MR last OV note - pt was to change to [facility] 100/50 2 puffs BID. New rx will be sent to the pharmacy.  Pt is aware that this has been taken care of.

## 2013-07-28 IMAGING — CT CT ANGIO CHEST
1 of 6 series · 5 of 36 positions shown · IV contrast (omnipaque)
Comparison: None.

CLINICAL DATA: Chest pain.

CT ANGIOGRAPHY CHEST
TECHNIQUE: Multidetector CT imaging of the chest using the
standard protocol during bolus administration of intravenous
contrast. Multiplanar reconstructed images including MIPs were
obtained and reviewed to evaluate the vascular anatomy.
Contrast: 100mL OMNIPAQUE IOHEXOL 350 MG/ML SOLN

[Series 4: pe 3.0 b40f · axial · 0.70mm/px · z∈[-252,-94]mm · 5 of 81 slices shown]
[im 14/81  lung]
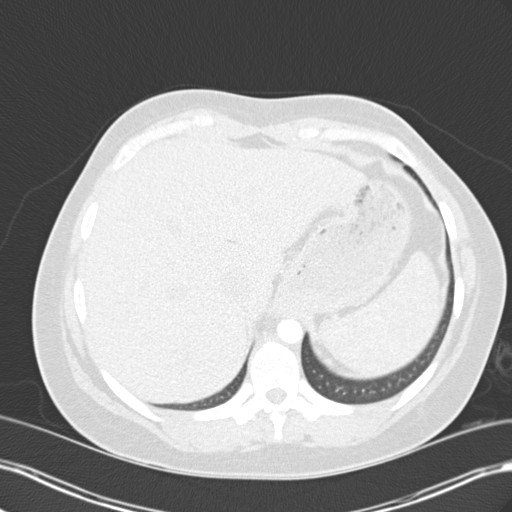
[im 27/81  mediastinal]
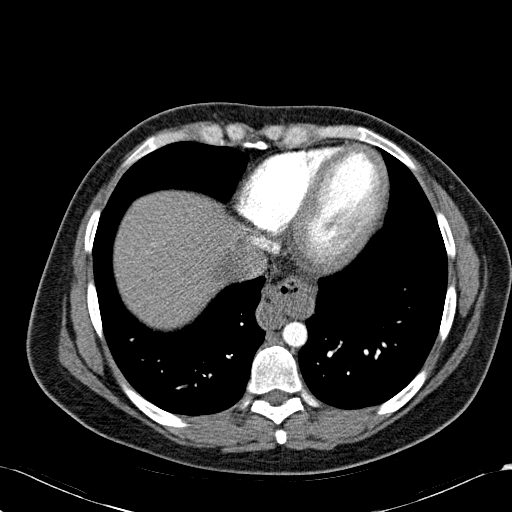
[im 41/81  lung]
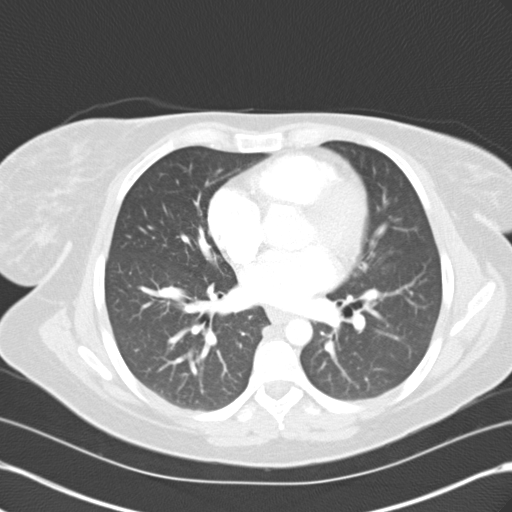
[im 54/81  mediastinal]
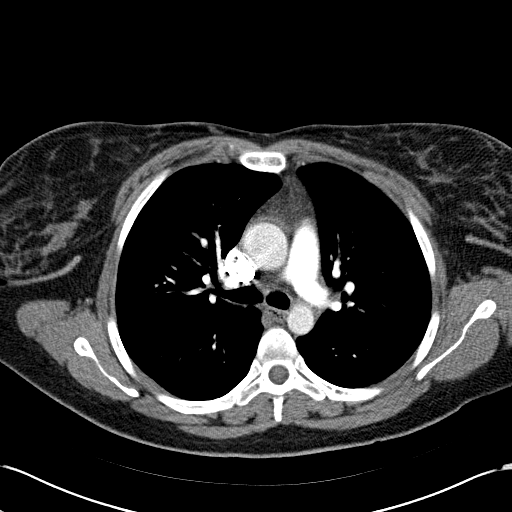
[im 67/81  lung]
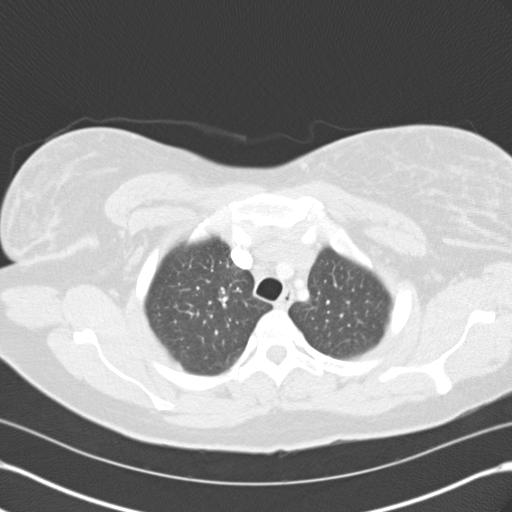

[5 of 36 positions shown; findings below may reference images not displayed]

FINDINGS: No filling defects in the pulmonary arteries to suggest
wall pulmonary emboli. Heart is normal size. Aorta is normal
caliber. No mediastinal, hilar, or axillary adenopathy.  Visualized
thyroid and chest wall soft tissues unremarkable.  Small hiatal
hernia. Lungs are clear.  No focal airspace opacities or suspicious
nodules.  No effusions. Imaging into the upper abdomen shows no
acute findings.

No acute bony abnormality.
IMPRESSION: No evidence of pulmonary embolus.

Small hiatal hernia.

No acute findings.

## 2013-08-13 ENCOUNTER — Telehealth: Payer: Self-pay | Admitting: Internal Medicine

## 2013-08-13 MED ORDER — LANSOPRAZOLE 15 MG PO CPDR: 15.0000 mg | DELAYED_RELEASE_CAPSULE | Freq: Every day | ORAL | Status: DC

## 2013-08-13 NOTE — Telephone Encounter (Signed)
Yes please change   Dr. Kalman Shan, M.D., [facility].C.P Pulmonary and Critical Care Medicine Staff Physician [facility] [place] Pulmonary and Critical Care Pager: [phone], If no answer or between  15:00h - 7:00h: call [phone]  08/13/2013 12:25 PM

## 2013-08-13 NOTE — Telephone Encounter (Signed)
I received a fax asking for a PA for lansoprazole capsules 30mg  but according to medicaid med list this is a covered med. So I called medicaid at [phone] and inquired about this and was advised that the covered version is the lansoprazole 15mg  capsules. Please advise if ok to change med to the 15mg  so it will be covered by insurance? Thanks. Carron Curie, CMA

## 2013-08-13 NOTE — Telephone Encounter (Signed)
rx sent. Jaman Aro, CMA  

## 2013-08-27 ENCOUNTER — Telehealth: Payer: Self-pay | Admitting: Internal Medicine

## 2013-08-27 MED ORDER — ALBUTEROL SULFATE (2.5 MG/3ML) 0.083% IN NEBU: 2.5000 mg | INHALATION_SOLUTION | Freq: Four times a day (QID) | RESPIRATORY_TRACT | Status: DC | PRN

## 2013-08-27 MED ORDER — ALBUTEROL SULFATE HFA 108 (90 BASE) MCG/ACT IN AERS: 1.0000 | INHALATION_SPRAY | Freq: Four times a day (QID) | RESPIRATORY_TRACT | Status: DC | PRN

## 2013-08-27 NOTE — Telephone Encounter (Signed)
ATC patient Female answered the phone and stated patient was not in LM with female to have patient return our call

## 2013-08-27 NOTE — Telephone Encounter (Signed)
Returning call.

## 2013-08-27 NOTE — Telephone Encounter (Signed)
Spoke with patient--  Patient had Dulera changed from high dose to low dose 06/15/13 Patient states in the past couple of weeks she has increased SOB and diff "catching her breath" Also states she has used her rescue inhaler more than twice this week and was told to let MR know when this happens Requesting recs per Dr. Marchelle Gearing  Patient also requesting refill on albuterol Neb and HFA These have been sent to [facility] as requested  Please advise Dr. Marchelle Gearing thank you

## 2013-08-30 MED ORDER — MOMETASONE FURO-FORMOTEROL FUM 200-5 MCG/ACT IN AERO: 2.0000 | INHALATION_SPRAY | Freq: Two times a day (BID) | RESPIRATORY_TRACT | Status: DC

## 2013-08-30 NOTE — Telephone Encounter (Signed)
Go back to high dose dulera 2 puff bid with atleat 6 refills Send alb neb and hfa refills Followup as previously advised. Please ensure Tell her to get flu shot anywhere asap    Dr. Kalman Shan, M.D., [facility].C.P Pulmonary and Critical Care Medicine Staff Physician [place] [facility]  Pulmonary and Critical Care Pager: [phone], If no answer or between  15:00h - 7:00h: call [phone]  08/30/2013 11:12 AM

## 2013-08-30 NOTE — Telephone Encounter (Signed)
Called, spoke with pt.  Informed her of below per MR. Pt is aware I have sent dulera 200 to [facility] and to use this in place on dulera 100. Albuterol hfa and albuterol nebs rxs have already been sent to [facility] by Leotis Shames -- pt aware. She is aware to follow up in 4 months from July 2014 OV and is to call back if anything is needed prior to this or if increased dulera dosage doesn't help. Pt states she will get the flu shot asap. She verbalized understanding of instructions and voiced no further questions or concerns at this time.

## 2013-08-31 ENCOUNTER — Other Ambulatory Visit: Payer: Self-pay | Admitting: Obstetrics & Gynecology

## 2013-10-14 IMAGING — CR DG CHEST 2V
2 series · 2 of 2 positions shown · non-contrast
Comparison: 11/14/2012

CLINICAL DATA: Endotracheal tube placement

CHEST - 2 VIEW

[view not recorded (1 of 2)]
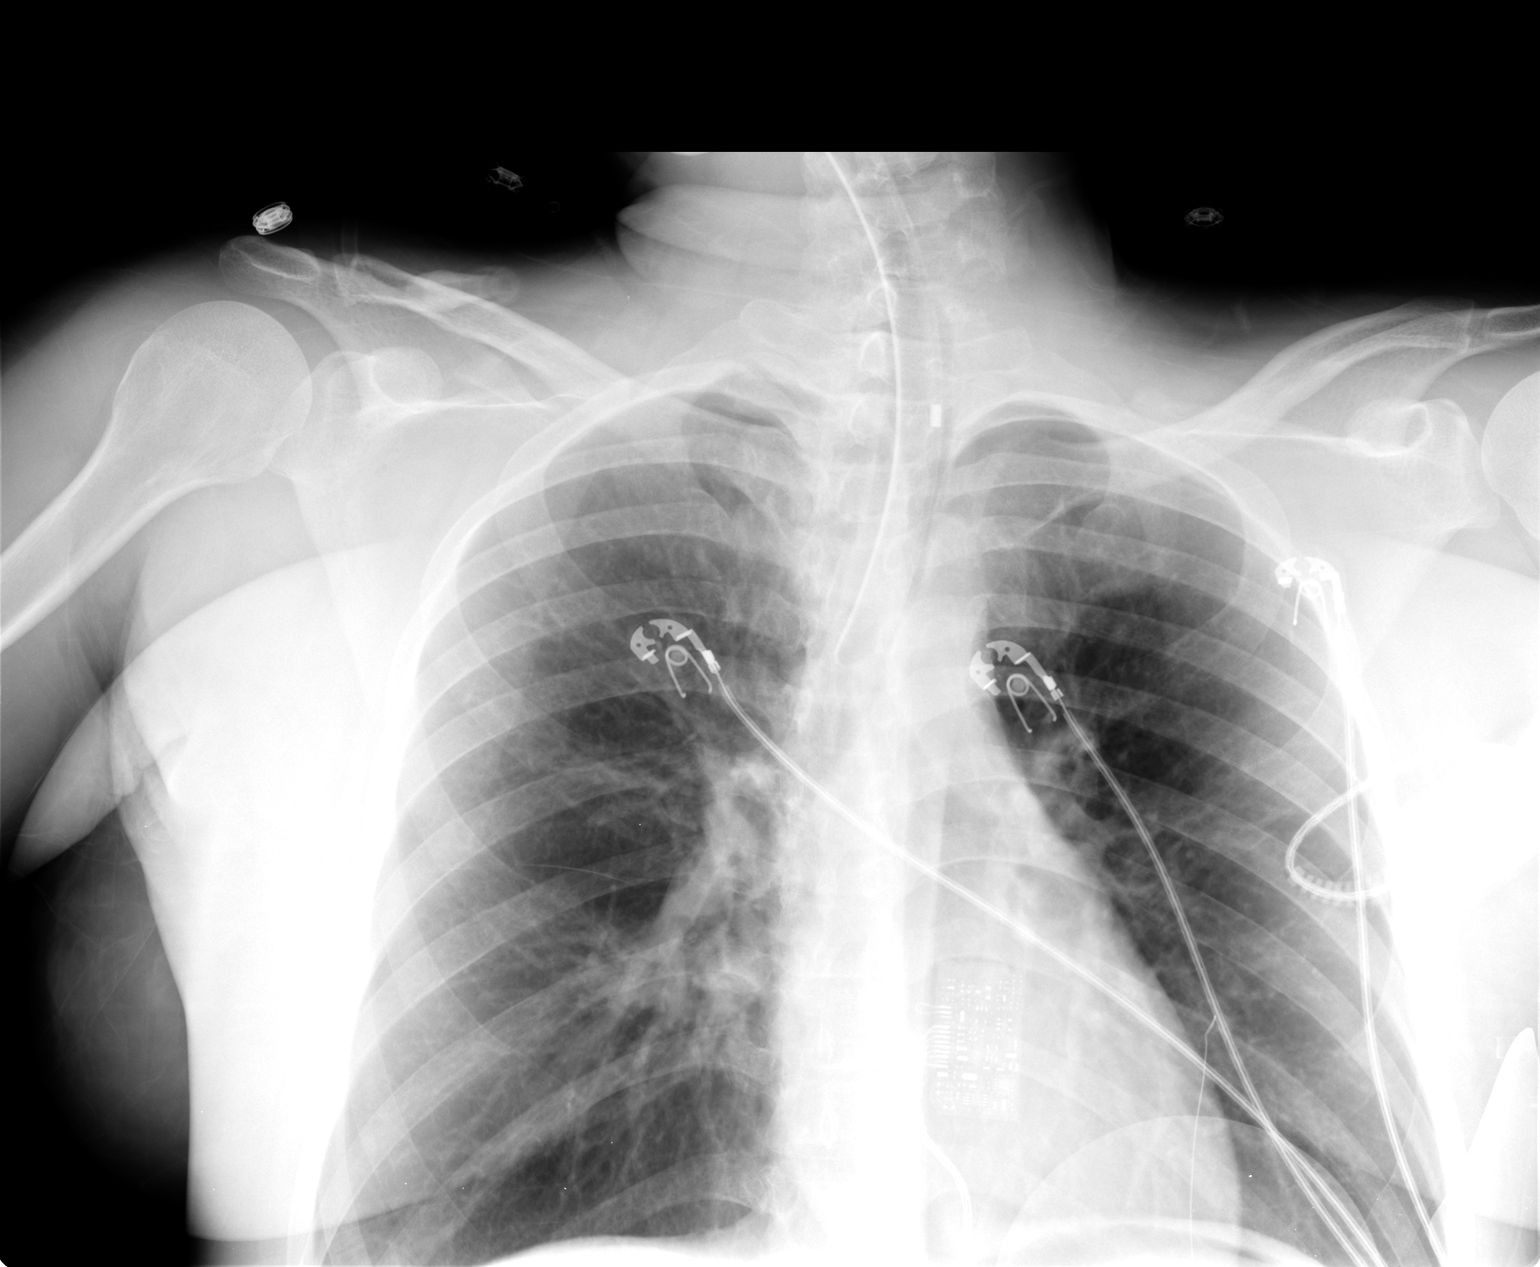

[view not recorded (2 of 2)]
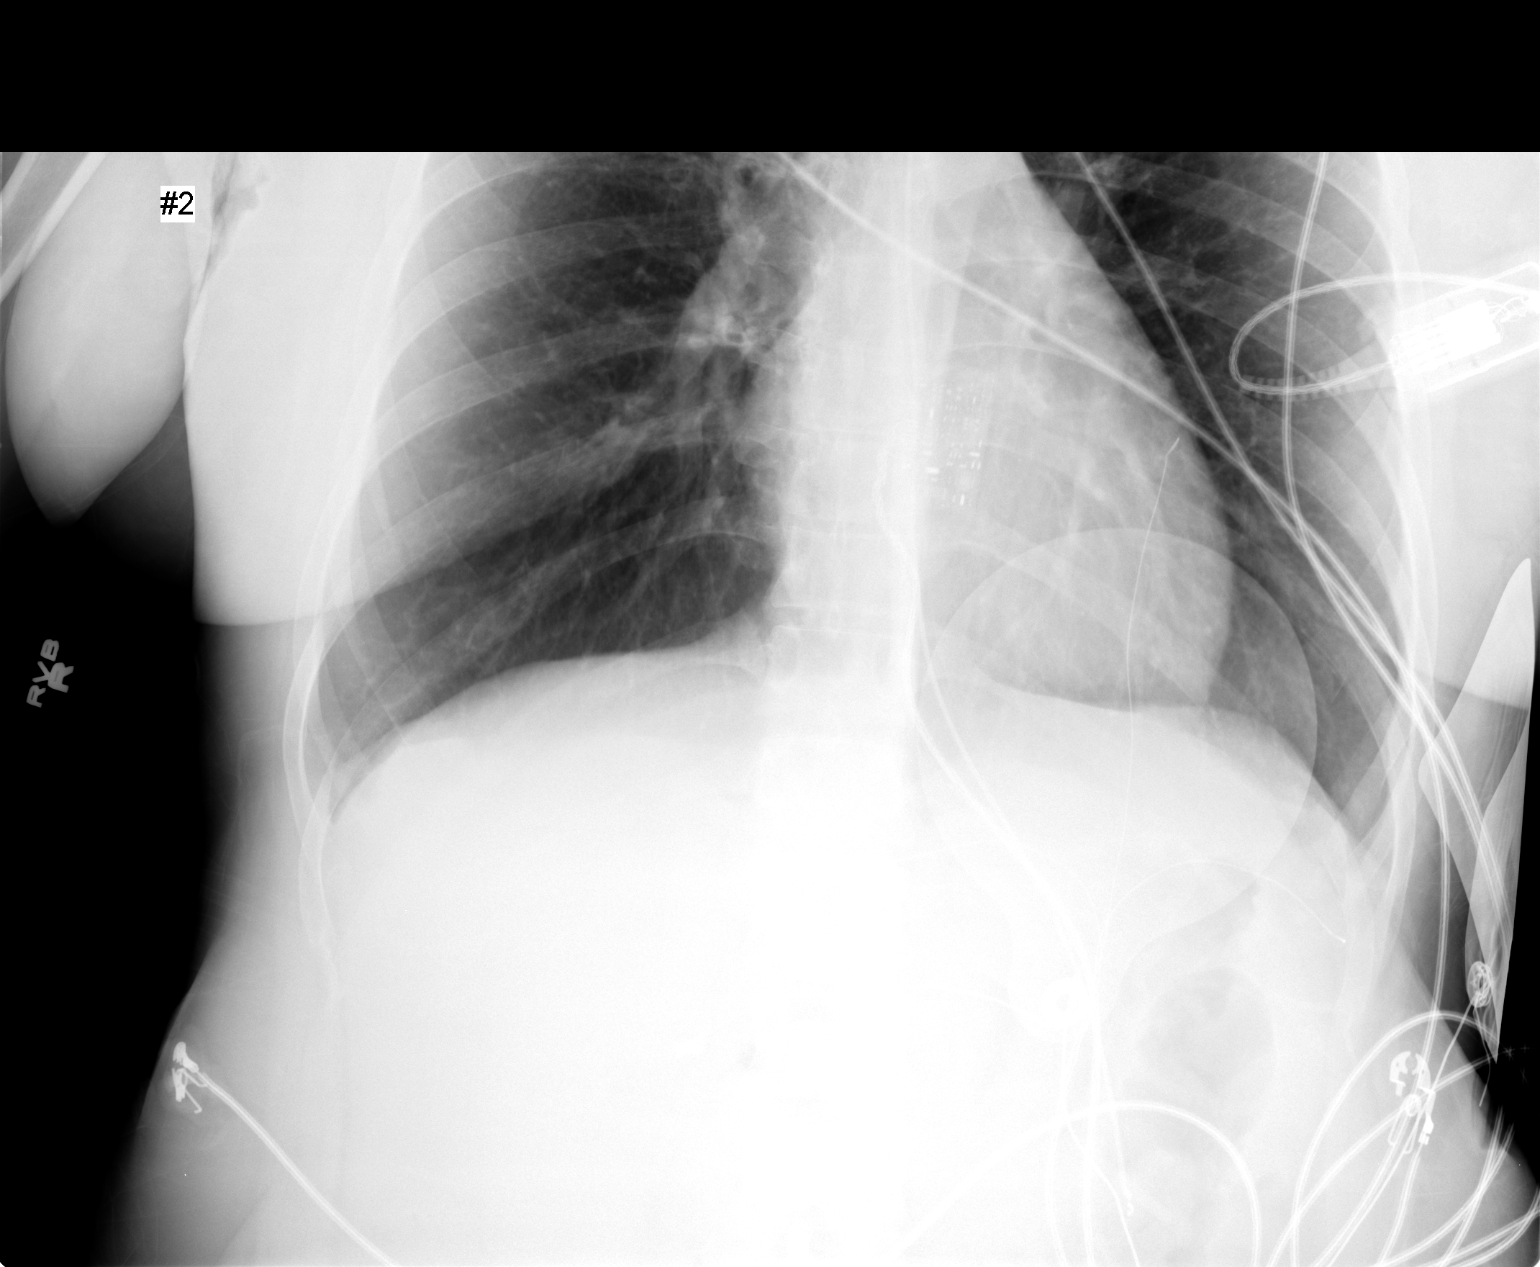

[2 of 2 positions shown; findings below may reference images not displayed]

FINDINGS: Apparently, two films were obtained as part of this exam.
On the first film, the endotracheal tube tip is 1.6 cm above the
base of the carina. The lungs are clear without focal infiltrate,
edema, pneumothorax or pleural effusion.

On the film labeled #2, there is now in Bryan Carlo tube evident with the
tip overlying the mid stomach.  Defibrillator pads overlie the left
chest. Telemetry leads overlie the chest.
IMPRESSION: Endotracheal tube tip is 1.6 cm above the base of the carina.

NG tube tip overlies the mid stomach.

## 2013-10-26 ENCOUNTER — Telehealth: Payer: Self-pay | Admitting: Internal Medicine

## 2013-10-26 NOTE — Telephone Encounter (Signed)
LM to schedule apt x3. No return call back. Sent letter 10/26/13.  °

## 2013-12-24 ENCOUNTER — Emergency Department (HOSPITAL_COMMUNITY): Payer: Medicaid Other

## 2013-12-24 ENCOUNTER — Emergency Department (HOSPITAL_COMMUNITY)
Admission: EM | Admit: 2013-12-24 | Discharge: 2013-12-25 | Disposition: A | Discharge status: Home / Self Care | Payer: Medicaid Other | Attending: Emergency Medicine | Admitting: Emergency Medicine

## 2013-12-24 ENCOUNTER — Encounter (HOSPITAL_COMMUNITY): Payer: Self-pay | Admitting: Emergency Medicine

## 2013-12-24 DIAGNOSIS — R42 Dizziness and giddiness: Secondary | ICD-10-CM | POA: Insufficient documentation

## 2013-12-24 DIAGNOSIS — Z3202 Encounter for pregnancy test, result negative: Secondary | ICD-10-CM | POA: Insufficient documentation

## 2013-12-24 DIAGNOSIS — J45909 Unspecified asthma, uncomplicated: Secondary | ICD-10-CM | POA: Insufficient documentation

## 2013-12-24 DIAGNOSIS — Z87891 Personal history of nicotine dependence: Secondary | ICD-10-CM | POA: Insufficient documentation

## 2013-12-24 DIAGNOSIS — J111 Influenza due to unidentified influenza virus with other respiratory manifestations: Secondary | ICD-10-CM | POA: Insufficient documentation

## 2013-12-24 DIAGNOSIS — Z79899 Other long term (current) drug therapy: Secondary | ICD-10-CM | POA: Insufficient documentation

## 2013-12-24 DIAGNOSIS — IMO0002 Reserved for concepts with insufficient information to code with codable children: Secondary | ICD-10-CM | POA: Insufficient documentation

## 2013-12-24 DIAGNOSIS — Z791 Long term (current) use of non-steroidal anti-inflammatories (NSAID): Secondary | ICD-10-CM | POA: Insufficient documentation

## 2013-12-24 DIAGNOSIS — R55 Syncope and collapse: Secondary | ICD-10-CM | POA: Insufficient documentation

## 2013-12-24 DIAGNOSIS — K219 Gastro-esophageal reflux disease without esophagitis: Secondary | ICD-10-CM | POA: Insufficient documentation

## 2013-12-24 LAB — CBC WITH DIFFERENTIAL/PLATELET
BASOS ABS: 0 10*3/uL (ref 0.0–0.1)
Basophils Relative: 0 % (ref 0–1)
EOS ABS: 0 10*3/uL (ref 0.0–0.7)
EOS PCT: 1 % (ref 0–5)
HCT: 37.4 % (ref 36.0–46.0)
Hemoglobin: 13.1 g/dL (ref 12.0–15.0)
LYMPHS ABS: 0.8 10*3/uL (ref 0.7–4.0)
Lymphocytes Relative: 23 % (ref 12–46)
MCH: 28.5 pg (ref 26.0–34.0)
MCHC: 35 g/dL (ref 30.0–36.0)
MCV: 81.5 fL (ref 78.0–100.0)
Monocytes Absolute: 0.3 10*3/uL (ref 0.1–1.0)
Monocytes Relative: 10 % (ref 3–12)
Neutro Abs: 2.2 10*3/uL (ref 1.7–7.7)
Neutrophils Relative %: 66 % (ref 43–77)
PLATELETS: 159 10*3/uL (ref 150–400)
RBC: 4.59 MIL/uL (ref 3.87–5.11)
RDW: 13.8 % (ref 11.5–15.5)
WBC: 3.3 10*3/uL — ABNORMAL LOW (ref 4.0–10.5)

## 2013-12-24 LAB — COMPREHENSIVE METABOLIC PANEL
ALT: 12 U/L (ref 0–35)
AST: 20 U/L (ref 0–37)
Albumin: 3.4 g/dL — ABNORMAL LOW (ref 3.5–5.2)
Alkaline Phosphatase: 66 U/L (ref 39–117)
BUN: 10 mg/dL (ref 6–23)
CALCIUM: 8.5 mg/dL (ref 8.4–10.5)
CO2: 23 mEq/L (ref 19–32)
Chloride: 101 mEq/L (ref 96–112)
Creatinine, Ser: 0.63 mg/dL (ref 0.50–1.10)
GFR calc non Af Amer: >90 mL/min (ref >90)
GLUCOSE: 95 mg/dL (ref 70–99)
Potassium: 3.4 mEq/L — ABNORMAL LOW (ref 3.7–5.3)
SODIUM: 139 meq/L (ref 137–147)
TOTAL PROTEIN: 7.1 g/dL (ref 6.0–8.3)
Total Bilirubin: <0.2 mg/dL — ABNORMAL LOW (ref 0.3–1.2)

## 2013-12-24 LAB — TROPONIN I: Troponin I: <0.3 ng/mL (ref <0.30)

## 2013-12-24 LAB — D-DIMER, QUANTITATIVE (NOT AT ARMC): D DIMER QUANT: 0.29 ug{FEU}/mL (ref 0.00–0.48)

## 2013-12-24 MED ORDER — SODIUM CHLORIDE 0.9 % IV BOLUS (SEPSIS)
1000.0000 mL | Freq: Once | INTRAVENOUS | Status: AC
  Administered 2013-12-24: 1000 mL via INTRAVENOUS

## 2013-12-24 NOTE — ED Notes (Signed)
MD at the bedside  

## 2013-12-24 NOTE — ED Notes (Signed)
Ambulated patient around nurses station. No issues patient was fine.

## 2013-12-24 NOTE — ED Notes (Signed)
Pt was seen at an urgent care today and is being treated for the flu with tamilflu and bronchitis with a z pack. Pt went to [facility] to have scripts filled and pt states she fell out or had a seizure. Pt refused transport by EMS. This happened around 5:30pm per pt but didn't come to the hospital until 8:30.

## 2013-12-24 NOTE — ED Notes (Signed)
Pt tolerated ambulation and fluids well.

## 2013-12-24 NOTE — ED Notes (Signed)
Pt denies pain, body aches, states she felt dizzy, and hot at the store, and woke up on the floor, denies injury

## 2013-12-24 NOTE — ED Provider Notes (Signed)
CSN: [id number]     Arrival date & time 12/24/13  2026 History  This chart was scribed for Katrina Octave, MD by Karle Plumber, ED Scribe. This patient was seen in room APA01/APA01 and the patient's care was started at 9:38 PM.  Chief Complaint  Patient presents with  . Near Syncope   The history is provided by the patient. No language interpreter was used.   HPI Comments:  Katrina Mitchell is a 33 y.o. female who presents to the Emergency Department complaining of syncope approximately 4 hours ago. She states she went to the urgent care and was given a prescription for Zithromax and was in [place] when she began to feel hot and light-headed. She reports HA today and emesis yesterday. She reports generalized soreness secondary to the flu. She is unsure if she hit her head. She denies CP, neck pain or back pain. Pt denies h/o seizures. She reports h/o asthma and GERD.   Past Medical History  Diagnosis Date  . Asthma   . Pregnancy as incidental finding   . Complication of anesthesia   . GERD (gastroesophageal reflux disease)    Past Surgical History  Procedure Laterality Date  . Nasal sinus surgery    . Cholecystectomy  2001  . Lypoma removal  2003    right arm   . Laparoscopic tubal ligation  08/19/2012    Procedure: LAPAROSCOPIC TUBAL LIGATION;  Surgeon: Lazaro Arms, MD;  Location: [facility];  Service: Gynecology;  Laterality: Bilateral;   History reviewed. No pertinent family history. History  Substance Use Topics  . Smoking status: Former Smoker -- 1.00 packs/day for 2 years    Types: Cigarettes    Quit date: 07/10/2011  . Smokeless tobacco: Never Used  . Alcohol Use: No     Comment: weekly   OB History   Grav Para Term Preterm Abortions TAB SAB Ect Mult Living   3 3 2 1  0 0 0 0 0 3     Review of Systems A complete 10 system review of systems was obtained and all systems are negative except as noted in the HPI and PMH.   Allergies  Review of patient's allergies  indicates no known allergies.  Home Medications   Current Outpatient Rx  Name  Route  Sig  Dispense  Refill  . acetaminophen (TYLENOL) 325 MG tablet      Per bottle         . albuterol (PROVENTIL HFA;VENTOLIN HFA) 108 (90 BASE) MCG/ACT inhaler   Inhalation   Inhale 1-2 puffs into the lungs every 6 (six) hours as needed for wheezing.   1 Inhaler   3   . albuterol (PROVENTIL) (2.5 MG/3ML) 0.083% nebulizer solution   Nebulization   Take 3 mLs (2.5 mg total) by nebulization every 6 (six) hours as needed for wheezing.   75 mL   3   . famotidine (PEPCID) 20 MG tablet   Oral   Take 1 tablet (20 mg total) by mouth at bedtime.         . fluticasone (FLONASE) 50 MCG/ACT nasal spray   Nasal   Place 2 sprays into the nose daily.   16 g   6   . ibuprofen (ADVIL,MOTRIN) 200 MG tablet      Per bottle         . ipratropium-albuterol (DUONEB) 0.5-2.5 (3) MG/3ML SOLN      USE ONE VIAL IN NEBULIZER TWICE DAILY AS DIRECTED   3 mL  11   . lansoprazole (PREVACID) 15 MG capsule   Oral   Take 1 capsule (15 mg total) by mouth daily.   30 capsule   6   . loratadine (CLARITIN) 10 MG tablet   Oral   Take 10 mg by mouth daily.         . mometasone-formoterol (DULERA) 200-5 MCG/ACT AERO   Inhalation   Inhale 2 puffs into the lungs 2 (two) times daily.   1 Inhaler   3   . mometasone-formoterol (DULERA) 200-5 MCG/ACT AERO   Inhalation   Inhale 2 puffs into the lungs 2 (two) times daily.   1 Inhaler   6    Triage Vitals: BP 113/68  Pulse 96  Temp(Src) 100.4 F (38 C) (Oral)  Resp 20  Ht 5\' 7"  (1.702 m)  Wt 197 lb (89.359 kg)  BMI 30.85 kg/m2  SpO2 100%  LMP 12/09/2013 Physical Exam  Nursing note and vitals reviewed. Constitutional: She is oriented to person, place, and time. She appears well-developed and well-nourished. No distress.  HENT:  Head: Normocephalic and atraumatic.  Mouth/Throat: Oropharynx is clear and moist. No oropharyngeal exudate.  Nasal  congestion.   Eyes: Conjunctivae and EOM are normal. Pupils are equal, round, and reactive to light.  Neck: Normal range of motion. Neck supple.  No meningismus  Cardiovascular: Normal rate, regular rhythm and normal heart sounds.   No murmur heard. Pulmonary/Chest: Effort normal and breath sounds normal. She has no wheezes.  Abdominal: Soft. There is no tenderness. There is no rebound and no guarding.  Musculoskeletal: Normal range of motion. She exhibits no tenderness.  No C-spine pain.   Neurological: She is alert and oriented to person, place, and time. No cranial nerve deficit. She exhibits normal muscle tone. Coordination normal.  Skin: Skin is warm and dry.  Psychiatric: She has a normal mood and affect. Her behavior is normal.  No meningismus .  CN 2-12 intact, no ataxia on finger to nose, no nystagmus, 5/5 strength throughout, no pronator drift, Romberg negative, normal gait.   ED Course  Procedures (including critical care time) DIAGNOSTIC STUDIES: Oxygen Saturation is 100% on RA, normal by my interpretation.   COORDINATION OF CARE: 9:42 PM- Will order a CT scan of the head and start IV fluids. Pt verbalizes understanding and agrees to plan.  Medications  sodium chloride 0.9 % bolus 1,000 mL (0 mLs Intravenous Stopped 12/25/13 0000)   Labs Review Labs Reviewed  CBC WITH DIFFERENTIAL - Abnormal; Notable for the following:    WBC 3.3 (*)    All other components within normal limits  COMPREHENSIVE METABOLIC PANEL - Abnormal; Notable for the following:    Potassium 3.4 (*)    Albumin 3.4 (*)    Total Bilirubin <0.2 (*)    All other components within normal limits  TROPONIN I  D-DIMER, QUANTITATIVE  URINALYSIS, ROUTINE W REFLEX MICROSCOPIC  PREGNANCY, URINE   Imaging Review Dg Chest 2 View  12/24/2013   CLINICAL DATA:  Cough, fever, syncope and weakness.  Body aches.  EXAM: CHEST  2 VIEW  COMPARISON:  Chest radiograph performed 08/31/2013  FINDINGS: The lungs are  well-aerated. Mild chronic peribronchial thickening is noted. There is no evidence of focal opacification, pleural effusion or pneumothorax.  The heart is normal in size; the mediastinal contour is within normal limits. No acute osseous abnormalities are seen.  IMPRESSION: No acute cardiopulmonary process seen. Mild chronic peribronchial thickening noted.   Electronically Signed   By:  Roanna Raider M.D.   On: 12/24/2013 23:53   Ct Head Wo Contrast  12/24/2013   CLINICAL DATA:  Syncope  EXAM: CT HEAD WITHOUT CONTRAST  TECHNIQUE: Contiguous axial images were obtained from the base of the skull through the vertex without intravenous contrast.  COMPARISON:  None.  FINDINGS: No evidence of parenchymal hemorrhage or extra-axial fluid collection. No mass lesion, mass effect, or midline shift.  No CT evidence of acute infarction.  Cerebral volume is within normal limits.  No ventriculomegaly.  New complete opacification of the bilateral ethmoid sinuses. Mild to moderate mucosal thickening involving the bilateral maxillary sinuses. Mastoid air cells are clear.  No evidence of calvarial fracture.  IMPRESSION: No evidence of acute intracranial abnormality.  Paranasal sinuses disease, as above.   Electronically Signed   By: Charline Bills M.D.   On: 12/24/2013 23:51    EKG Interpretation    Date/Time:  Friday December 24 2013 22:39:19 EST Ventricular Rate:  90 PR Interval:  130 QRS Duration: 90 QT Interval:  346 QTC Calculation: 423 R Axis:   61 Text Interpretation:  Normal sinus rhythm Normal ECG When compared with ECG of 18-Sep-2012 08:10, No significant change was found No significant change was found Confirmed by Manus Gunning  MD, Thedore Pickel ([id number]) on 12/24/2013 10:51:05 PM            MDM   1. Influenza   2. Syncope    several day history of body aches, congestion. Treated for influenza and bronchitis at urgent care. Had syncopal episode while at a pharmacy. Uncertain if she hit head. No tongue biting  or urinary incontinence. No chest pain or shortness of breath.  Endorses diffuse body aches. EKG normal sinus rhythm, no ST changes. Patient given IV fluids and antipyretics. D-dimer negative. CT head negative. Suspect syncope in setting of illness and dehydration. No evidence of arrhythmias. No documented seizure activity.  Patient tolerating by mouth in ED ambulatory. HCG pending at time of signout. Patient has had previous tubal ligation.  I personally performed the services described in this documentation, which was scribed in my presence. The recorded information has been reviewed and is accurate.    Katrina Octave, MD 12/25/13 [id number]

## 2013-12-24 NOTE — ED Notes (Signed)
Pt states she drove herself to the ED

## 2013-12-25 LAB — URINALYSIS, ROUTINE W REFLEX MICROSCOPIC
BILIRUBIN URINE: NEGATIVE
Glucose, UA: NEGATIVE mg/dL
Hgb urine dipstick: NEGATIVE
KETONES UR: NEGATIVE mg/dL
Leukocytes, UA: NEGATIVE
NITRITE: NEGATIVE
PROTEIN: NEGATIVE mg/dL
Specific Gravity, Urine: >1.03 — ABNORMAL HIGH (ref 1.005–1.030)
UROBILINOGEN UA: 0.2 mg/dL (ref 0.0–1.0)
pH: 6 (ref 5.0–8.0)

## 2013-12-25 LAB — PREGNANCY, URINE: Preg Test, Ur: NEGATIVE

## 2013-12-25 NOTE — Discharge Instructions (Signed)
Syncope  Syncope is a fainting spell. This means the person loses consciousness and drops to the ground. The person is generally unconscious for less than 5 minutes. The person may have some muscle twitches for up to 15 seconds before waking up and returning to normal. Syncope occurs more often in elderly people, but it can happen to anyone. While most causes of syncope are not dangerous, syncope can be a sign of a serious medical problem. It is important to seek medical care.   CAUSES   Syncope is caused by a sudden decrease in blood flow to the brain. The specific cause is often not determined. Factors that can trigger syncope include:   Taking medicines that lower blood pressure.   Sudden changes in posture, such as standing up suddenly.   Taking more medicine than prescribed.   Standing in one place for too long.   Seizure disorders.   Dehydration and excessive exposure to heat.   Low blood sugar (hypoglycemia).   Straining to have a bowel movement.   Heart disease, irregular heartbeat, or other circulatory problems.   Fear, emotional distress, seeing blood, or severe pain.  SYMPTOMS   Right before fainting, you may:   Feel dizzy or lightheaded.   Feel nauseous.   See all white or all black in your field of vision.   Have cold, clammy skin.  DIAGNOSIS   Your caregiver will ask about your symptoms, perform a physical exam, and perform electrocardiography (ECG) to record the electrical activity of your heart. Your caregiver may also perform other heart or blood tests to determine the cause of your syncope.  TREATMENT   In most cases, no treatment is needed. Depending on the cause of your syncope, your caregiver may recommend changing or stopping some of your medicines.  HOME CARE INSTRUCTIONS   Have someone stay with you until you feel stable.   Do not drive, operate machinery, or play sports until your caregiver says it is okay.   Keep all follow-up appointments as directed by your  caregiver.   Lie down right away if you start feeling like you might faint. Breathe deeply and steadily. Wait until all the symptoms have passed.   Drink enough fluids to keep your urine clear or pale yellow.   If you are taking blood pressure or heart medicine, get up slowly, taking several minutes to sit and then stand. This can reduce dizziness.  SEEK IMMEDIATE MEDICAL CARE IF:    You have a severe headache.   You have unusual pain in the chest, abdomen, or back.   You are bleeding from the mouth or rectum, or you have black or tarry stool.   You have an irregular or very fast heartbeat.   You have pain with breathing.   You have repeated fainting or seizure-like jerking during an episode.   You faint when sitting or lying down.   You have confusion.   You have difficulty walking.   You have severe weakness.   You have vision problems.  If you fainted, call your local emergency services (911 in U.S.). Do not drive yourself to the hospital.   MAKE SURE YOU:   Understand these instructions.   Will watch your condition.   Will get help right away if you are not doing well or get worse.  Document Released: 11/25/2005 Document Revised: 05/26/2012 Document Reviewed: 01/24/2012  ExitCare Patient Information 2014 [employer].

## 2013-12-25 NOTE — ED Notes (Signed)
Pt not been able to void.

## 2014-05-09 ENCOUNTER — Encounter (HOSPITAL_COMMUNITY): Payer: Self-pay | Admitting: Emergency Medicine

## 2014-05-09 ENCOUNTER — Emergency Department (HOSPITAL_COMMUNITY)
Admission: EM | Admit: 2014-05-09 | Discharge: 2014-05-09 | Disposition: A | Discharge status: Home / Self Care | Payer: Medicaid Other | Attending: Emergency Medicine | Admitting: Emergency Medicine

## 2014-05-09 DIAGNOSIS — K219 Gastro-esophageal reflux disease without esophagitis: Secondary | ICD-10-CM | POA: Insufficient documentation

## 2014-05-09 DIAGNOSIS — IMO0002 Reserved for concepts with insufficient information to code with codable children: Secondary | ICD-10-CM | POA: Insufficient documentation

## 2014-05-09 DIAGNOSIS — Z79899 Other long term (current) drug therapy: Secondary | ICD-10-CM | POA: Insufficient documentation

## 2014-05-09 DIAGNOSIS — Z3202 Encounter for pregnancy test, result negative: Secondary | ICD-10-CM | POA: Insufficient documentation

## 2014-05-09 DIAGNOSIS — Z87891 Personal history of nicotine dependence: Secondary | ICD-10-CM | POA: Insufficient documentation

## 2014-05-09 DIAGNOSIS — J45901 Unspecified asthma with (acute) exacerbation: Secondary | ICD-10-CM

## 2014-05-09 LAB — CBC
HEMATOCRIT: 37.6 % (ref 36.0–46.0)
Hemoglobin: 12.7 g/dL (ref 12.0–15.0)
MCH: 28.1 pg (ref 26.0–34.0)
MCHC: 33.8 g/dL (ref 30.0–36.0)
MCV: 83.2 fL (ref 78.0–100.0)
Platelets: 280 10*3/uL (ref 150–400)
RBC: 4.52 MIL/uL (ref 3.87–5.11)
RDW: 13.9 % (ref 11.5–15.5)
WBC: 5.8 10*3/uL (ref 4.0–10.5)

## 2014-05-09 LAB — BASIC METABOLIC PANEL
BUN: 5 mg/dL — ABNORMAL LOW (ref 6–23)
CO2: 21 mEq/L (ref 19–32)
CREATININE: 0.61 mg/dL (ref 0.50–1.10)
Calcium: 9.3 mg/dL (ref 8.4–10.5)
Chloride: 104 mEq/L (ref 96–112)
GFR calc non Af Amer: >90 mL/min (ref >90)
Glucose, Bld: 109 mg/dL — ABNORMAL HIGH (ref 70–99)
Potassium: 4.1 mEq/L (ref 3.7–5.3)
Sodium: 139 mEq/L (ref 137–147)

## 2014-05-09 LAB — POC URINE PREG, ED: Preg Test, Ur: NEGATIVE

## 2014-05-09 MED ORDER — IPRATROPIUM BROMIDE 0.02 % IN SOLN: 0.5000 mg | Freq: Four times a day (QID) | RESPIRATORY_TRACT | Status: DC

## 2014-05-09 MED ORDER — ALBUTEROL SULFATE (2.5 MG/3ML) 0.083% IN NEBU: 2.5000 mg | INHALATION_SOLUTION | Freq: Four times a day (QID) | RESPIRATORY_TRACT | Status: DC | PRN

## 2014-05-09 MED ORDER — ALBUTEROL SULFATE (2.5 MG/3ML) 0.083% IN NEBU
5.0000 mg | INHALATION_SOLUTION | Freq: Once | RESPIRATORY_TRACT | Status: AC
  Administered 2014-05-09: 5 mg via RESPIRATORY_TRACT
  Filled 2014-05-09: qty 6

## 2014-05-09 MED ORDER — ALBUTEROL SULFATE HFA 108 (90 BASE) MCG/ACT IN AERS
2.0000 | INHALATION_SPRAY | RESPIRATORY_TRACT | Status: DC | PRN
  Administered 2014-05-09: 2 via RESPIRATORY_TRACT
  Filled 2014-05-09: qty 6.7

## 2014-05-09 MED ORDER — PREDNISONE 10 MG PO TABS: ORAL_TABLET | ORAL | Status: DC

## 2014-05-09 MED ORDER — BUDESONIDE-FORMOTEROL FUMARATE 160-4.5 MCG/ACT IN AERO: 2.0000 | INHALATION_SPRAY | Freq: Two times a day (BID) | RESPIRATORY_TRACT | Status: DC

## 2014-05-09 NOTE — Discharge Instructions (Signed)
Albuterol and Atrovent by nebulizer as needed.  Prednisone as prescribed.   Asthma, Adult Asthma is a recurring condition in which the airways tighten and narrow. Asthma can make it difficult to breathe. It can cause coughing, wheezing, and shortness of breath. Asthma episodes (also called asthma attacks) range from minor to life-threatening. Asthma cannot be cured, but medicines and lifestyle changes can help control it. CAUSES Asthma is believed to be caused by inherited (genetic) and environmental factors, but its exact cause is unknown. Asthma may be triggered by allergens, lung infections, or irritants in the air. Asthma triggers are different for each person. Common triggers include:   Animal dander.  Dust mites.  Cockroaches.  Pollen from trees or grass.  Mold.  Smoke.  Air pollutants such as dust, household cleaners, hair sprays, aerosol sprays, paint fumes, strong chemicals, or strong odors.  Cold air, weather changes, and winds (which increase molds and pollens in the air).  Strong emotional expressions such as crying or laughing hard.  Stress.  Certain medicines (such as aspirin) or types of drugs (such as beta-blockers).  Sulfites in foods and drinks. Foods and drinks that may contain sulfites include dried fruit, potato chips, and sparkling grape juice.  Infections or inflammatory conditions such as the flu, a cold, or an inflammation of the nasal membranes (rhinitis).  Gastroesophageal reflux disease (GERD).  Exercise or strenuous activity. SYMPTOMS Symptoms may occur immediately after asthma is triggered or many hours later. Symptoms include:  Wheezing.  Excessive nighttime or early morning coughing.  Frequent or severe coughing with a common cold.  Chest tightness.  Shortness of breath. DIAGNOSIS  The diagnosis of asthma is made by a review of your medical history and a physical exam. Tests may also be performed. These may include:  Lung function  studies. These tests show how much air you breath in and out.  Allergy tests.  Imaging tests such as X-rays. TREATMENT  Asthma cannot be cured, but it can usually be controlled. Treatment involves identifying and avoiding your asthma triggers. It also involves medicines. There are 2 classes of medicine used for asthma treatment:   Controller medicines. These prevent asthma symptoms from occurring. They are usually taken every day.  Reliever or rescue medicines. These quickly relieve asthma symptoms. They are used as needed and provide short-term relief. Your health care provider will help you create an asthma action plan. An asthma action plan is a written plan for managing and treating your asthma attacks. It includes a list of your asthma triggers and how they may be avoided. It also includes information on when medicines should be taken and when their dosage should be changed. An action plan may also involve the use of a device called a peak flow meter. A peak flow meter measures how well the lungs are working. It helps you monitor your condition. HOME CARE INSTRUCTIONS   Take medicine as directed by your health care provider. Speak with your health care provider if you have questions about how or when to take the medicines.  Use a peak flow meter as directed by your health care provider. Record and keep track of readings.  Understand and use the action plan to help minimize or stop an asthma attack without needing to seek medical care.  Control your home environment in the following ways to help prevent asthma attacks:  Do not smoke. Avoid being exposed to secondhand smoke.  Change your heating and air conditioning filter regularly.  Limit your use of  fireplaces and wood stoves.  Get rid of pests (such as roaches and mice) and their droppings.  Throw away plants if you see mold on them.  Clean your floors and dust regularly. Use unscented cleaning products.  Try to have someone  else vacuum for you regularly. Stay out of rooms while they are being vacuumed and for a short while afterward. If you vacuum, use a dust mask from a hardware store, a double-layered or microfilter vacuum cleaner bag, or a vacuum cleaner with a HEPA filter.  Replace carpet with wood, tile, or vinyl flooring. Carpet can trap dander and dust.  Use allergy-proof pillows, mattress covers, and box spring covers.  Wash bed sheets and blankets every week in hot water and dry them in a dryer.  Use blankets that are made of polyester or cotton.  Clean bathrooms and kitchens with bleach. If possible, have someone repaint the walls in these rooms with mold-resistant paint. Keep out of the rooms that are being cleaned and painted.  Wash hands frequently. SEEK MEDICAL CARE IF:   You have wheezing, shortness of breath, or a cough even if taking medicine to prevent attacks.  The colored mucus you cough up (sputum) is thicker than usual.  Your sputum changes from clear or white to yellow, green, gray, or bloody.  You have any problems that may be related to the medicines you are taking (such as a rash, itching, swelling, or trouble breathing).  You are using a reliever medicine more than 2 3 times per week.  Your peak flow is still at 50 79% of you personal best after following your action plan for 1 hour. SEEK IMMEDIATE MEDICAL CARE IF:   You seem to be getting worse and are unresponsive to treatment during an asthma attack.  You are short of breath even at rest.  You get short of breath when doing very little physical activity.  You have difficulty eating, drinking, or talking due to asthma symptoms.  You develop chest pain.  You develop a fast heartbeat.  You have a bluish color to your lips or fingernails.  You are lightheaded, dizzy, or faint.  Your peak flow is less than 50% of your personal best.  You have a fever or persistent symptoms for more than 2 3 days.  You have a fever  and symptoms suddenly get worse. MAKE SURE YOU:   Understand these instructions.  Will watch your condition.  Will get help right away if you are not doing well or get worse. Document Released: 11/25/2005 Document Revised: 07/28/2013 Document Reviewed: 06/24/2013 [facility] Patient Information 2014 [place], [place].    Emergency Department Resource Guide 1) Find a Doctor and Pay Out of Pocket Although you won't have to find out who is covered by your insurance plan, it is a good idea to ask around and get recommendations. You will then need to call the office and see if the doctor you have chosen will accept you as a new patient and what types of options they offer for patients who are self-pay. Some doctors offer discounts or will set up payment plans for their patients who do not have insurance, but you will need to ask so you aren't surprised when you get to your appointment.  2) Contact Your Local Health Department Not all health departments have doctors that can see patients for sick visits, but many do, so it is worth a call to see if yours does. If you don't know where your local health  department is, you can check in your phone book. The CDC also has a tool to help you locate your state's health department, and many state websites also have listings of all of their local health departments.  3) Find a Walk-in Clinic If your illness is not likely to be very severe or complicated, you may want to try a walk in clinic. These are popping up all over the country in pharmacies, drugstores, and shopping centers. They're usually staffed by nurse practitioners or physician assistants that have been trained to treat common illnesses and complaints. They're usually fairly quick and inexpensive. However, if you have serious medical issues or chronic medical problems, these are probably not your best option.  No Primary Care Doctor: - Call Health Connect at  [phone] - they can help you locate a  primary care doctor that  accepts your insurance, provides certain services, etc. - Physician Referral Service- [phone]  Chronic Pain Problems: Organization         Address  Phone   Notes  [facility]  [phone] Patients need to be referred by their primary care doctor.   Medication Assistance: Organization         Address  Phone   Notes  [facility] Medication [facility] [address] [place] [place], [place] [address] [phone] --Must be a resident of [facility] -- Must have NO insurance coverage whatsoever (no Medicaid/ Medicare, etc.) -- The pt. MUST have a primary care doctor that directs their care regularly and follows them in the community   [facility]  [phone]   [employer]  [phone]    Agencies that provide inexpensive medical care: Organization         Address  Phone   Notes  [facility]  [phone]   [facility]    [phone]   [facility] [address] [place], [place] [address] [phone]   [facility] [address] [place] [place] [phone]   Planned Parenthood    [phone]   [facility]    [phone]   [facility]  [address], [place] Phone:  [phone], Fax:  [phone] Hours of Operation:  9 am - 6 pm, M-F.  Also accepts Medicaid/Medicare and self-pay.  [facility] for Children  [address], [place] Phone: [phone], Fax: [phone]. Hours of Operation:  8:30 am - 5:30 pm, M-F.  Also accepts Medicaid and self-pay.  [facility] [place] [address], [place] Phone: [phone]   [facility] [address] Natasha Bence [place], [place] [phone], Ext. [phone] Mondays & Thursdays: 7-9 AM.  First 15 patients are seen on a first come, first serve basis.    Medicaid-accepting [facility]  Providers:  Organization         Address  Phone   Notes  [facility] [address],  [phone] Also accepts self-pay patients.  [facility] [address] Laurell Josephs [place], [place]  [phone]   [facility] [address], [place] [phone]   [facility] Family Medicine [address] [place] [phone]   Renaye Rakers [address], [place]   [phone] Only accepts [place] patients after they have  their name applied to their card.   Self-Pay (no insurance) in [facility]:  Organization         Address  Phone   Notes  Sickle Cell Patients, [facility] [address] [place], [place] [phone]   [facility] Urgent Care [address] [place], [place] [phone]   [facility] [place]  [address], [place] [phone]   [facility]/Dr. Osei-Bonsu  [address] [place] or [address], [place] [phone] Phone number for both [place] and [place] locations is the same.  [facility]gent Medical and [facility] [address] [place] [phone]   [facility] [address] [place] or [address] [phone]   [facility] [address], [place] [phone], phone; [phone], fax Sees patients 1st and 3rd Saturday of every month.  Must not qualify for public or private insurance (i.e. Medicaid, Medicare, [place] Health Choice, Veterans' Benefits)  Household income should be no more than 200% of the poverty level The clinic cannot treat you if you are pregnant or think you are pregnant  Sexually transmitted diseases are not treated at the clinic.    Dental Care: Organization         Address  Phone  Notes  [facility] Department of [facility] [address] [place], [place] [phone] Accepts children up to age 2 who are enrolled in [place] or [place] Health Choice; pregnant women with a Medicaid card; and children who have applied for Medicaid or [place] Health Choice, but were declined, whose parents can pay a reduced fee at time of service.  [facility] of [facility]  [address], [place] [phone] Accepts children up to age 84 who are enrolled in [place] or [place] Health Choice; pregnant women with a Medicaid card; and children who have applied for Medicaid or [place] Health Choice, but were declined, whose parents can pay a reduced fee at time of service.  [facility]  [address] [place], [place] [phone] Patients are seen by appointment only. Walk-ins are not accepted. [facility] will see patients 30 years of age and older. Monday - Tuesday (8am-5pm) Most Wednesdays (8:30-5pm) $30 per visit, cash only  [facility]  [address], [facility] [phone] Patients are seen by appointment only. Walk-ins are not accepted. [facility] will see patients 53 years of age and older. One Wednesday Evening (Monthly: Volunteer Based).  $30 per visit, cash only  [employer]  [phone] for adults; Children under age 63, call Graduate Pediatric Dentistry at [phone]. Children aged 18-14, please call [phone] to request a pediatric application.  Dental services are provided in all areas of dental care including fillings, crowns and bridges, complete and partial dentures, implants, gum treatment, root canals, and extractions. Preventive care is also provided. Treatment is provided to both adults and children. Patients are selected via a lottery and there is often a waiting list.   [facility] [address] [place]  [phone] [email or web address]   [facility]  [address] [place], [place] [phone], Ext. [phone] Second and Fourth Thursday of each month, opens at 6:30 AM; Clinic ends at 9 AM.  Patients are seen on  a [employer]	[place]-come first-served basis, and a limited number are seen during each clinic.   [facility]  [address], [place], [place] [phone]   Eligibility Requirements You must have lived in [place], [place], or [place] counties for at least the last three months.   You cannot be eligible for state or federal sponsored [place]healthcare insurance, including CIGNAVeterans Administration, [place]icaid, or [employer]icare.   You generally cannot be eligible for healthcare insurance through your employer.    How to apply: Eligibility screenings are held every Tuesday and Wednesday afternoon from 1:00 pm until 4:00 pm. You do not need an appointment for the interview!  [facility] [address], [place], [place] [phone]   [facility]  [phone]   [facility]  [phone]   [facility]  [phone]    Behavioral Health Resources in the Community: Intensive Outpatient Programs Organization         Address  Phone  Notes  [facility] [address], [place], [place] [phone]   [facility] [address], [place], [place] [phone]   [facility] [address], [place], [place]  [phone]   [facility] [address],  [place], [place] [phone] or [phone]   Substance Abuse Resources Organization         Address  Phone  Notes  [facility]  [phone]   [facility]  [phone]   [facility]  [phone]   [facility]  [phone]   Residential & Outpatient Substance Abuse Program  [phone]   Psychological Services Organization         Address  Phone  Notes  [facility]  [phone]- [phone]    [facility]  [phone]   [facility] [address], [place] [phone] or [phone]    Mobile Crisis Teams Organization         Address  Phone  Notes  [facility], Mobile Crisis Care Unit  [phone]   [facility]  [address] [place], [place] [phone]   Doristine LocksSharon DeEsch [address] [place] [phone]    Self-Help/Support Groups Organization         Address  Phone             Notes  [facility] [place] - variety of support groups  [phone] Call for more information  Narcotics Anonymous (NA), [facility] [address], [place]  2 meetings at this location   Statisticianesidential Treatment Programs Organization         Address  Phone  Notes  [facility] [address],    [place]  [phone]   [facility]  [address], [place] [phone], [place], [place] [phone]   [facility] [address], [place] [phone] Admissions: 8am-3pm M-F  [facility] [address]    [place], [place] [phone]   [facility] [address], [place], [place] [phone]   [facility] [address]  [place], [place] [phone]   [facility] - Intensive Outpatient [address], [place], [place] [phone]   [facility] ([facility] [address]  [place], [place] [phone] or [phone]   [facility] ([facility]) [address] [place], [place] [phone]  Accepts Medicaid  [facility] [address],  [place] [phone] Substance Abuse/Addiction Treatment   [facility] Organization         Address  Phone  Notes  [facility]  [phone]   [name] Fava, PhD [address] Ervin Knack [place], [place]   [phone] or [phone]    [facility]   [address] [place], [place] [phone]   [facility] [address], [place], [place] [phone] Insurance/Medicaid/sponsorship through [facility] and Families [address]                                    [place], [place] [phone] Therapy/tele-psych/case  [facility] [address], [place] [phone]    Dr. Lolly Mustache  [phone]   [facility]  [employer] [facility] Dept. 1) [address], [place] 2) [address] [place] 3)  [address], [place] [phone]  [phone]   [facility] Child Abuse Hotline (314)557-6466 or [phone] (After Hours)

## 2014-05-09 NOTE — ED Notes (Signed)
Upon assessment of patient, she stated she feels better since she has been sitting down after treatment, but still feels short of breath.  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

## 2014-05-09 NOTE — ED Notes (Addendum)
Pt reports SOB x 1 week. States she takes [facility] for asthma, but states she hasn't been able to afford it recently. Exp. wheezing noted bilaterally. Speaks in complete sentences. Denies any CP. Denies recent cough. 99% on RA.

## 2014-05-10 NOTE — ED Provider Notes (Signed)
CSN: [id number]     Arrival date & time 05/09/14  1749 History   First MD Initiated Contact with Patient 05/09/14 1943     Chief Complaint  Patient presents with  . Asthma     (Consider location/radiation/quality/duration/timing/severity/associated sxs/prior Treatment) HPI Comments: Patient is a 33 year old female with history of asthma. She's been out of her medications as she had insurance changes and cannot afford them. She denies any fevers or chills. She denies any chest pain or productive cough.  Patient is a 33 y.o. female presenting with asthma. The history is provided by the patient.  Asthma This is a recurrent problem. The current episode started 2 days ago. The problem occurs constantly. The problem has been gradually worsening. Associated symptoms include shortness of breath. Pertinent negatives include no chest pain. Nothing aggravates the symptoms. Nothing relieves the symptoms. She has tried nothing for the symptoms. The treatment provided no relief.    Past Medical History  Diagnosis Date  . Asthma   . Pregnancy as incidental finding   . Complication of anesthesia   . GERD (gastroesophageal reflux disease)    Past Surgical History  Procedure Laterality Date  . Nasal sinus surgery    . Cholecystectomy  2001  . Lypoma removal  2003    right arm   . Laparoscopic tubal ligation  08/19/2012    Procedure: LAPAROSCOPIC TUBAL LIGATION;  Surgeon: Lazaro ArmsLuther H Eure, MD;  Location: [facility];  Service: Gynecology;  Laterality: Bilateral;   No family history on file. History  Substance Use Topics  . Smoking status: Former Smoker -- 1.00 packs/day for 2 years    Types: Cigarettes    Quit date: 07/10/2011  . Smokeless tobacco: Never Used  . Alcohol Use: No     Comment: weekly   OB History   Grav Para Term Preterm Abortions TAB SAB Ect Mult Living   3 3 2 1  0 0 0 0 0 3     Review of Systems  Respiratory: Positive for shortness of breath.   Cardiovascular: Negative for chest  pain.  All other systems reviewed and are negative.     Allergies  Review of patient's allergies indicates no known allergies.  Home Medications   Prior to Admission medications   Medication Sig Start Date End Date Taking? Authorizing Provider  acetaminophen (TYLENOL) 325 MG tablet Per bottle    Historical Provider, MD  albuterol (PROVENTIL HFA;VENTOLIN HFA) 108 (90 BASE) MCG/ACT inhaler Inhale 1-2 puffs into the lungs every 6 (six) hours as needed for wheezing. 08/27/13 08/27/14  Kalman ShanMurali Ramaswamy, MD  albuterol (PROVENTIL) (2.5 MG/3ML) 0.083% nebulizer solution Take 3 mLs (2.5 mg total) by nebulization every 6 (six) hours as needed for wheezing. 08/27/13 08/27/14  Kalman ShanMurali Ramaswamy, MD  albuterol (PROVENTIL) (2.5 MG/3ML) 0.083% nebulizer solution Take 3 mLs (2.5 mg total) by nebulization every 6 (six) hours as needed for wheezing or shortness of breath. 05/09/14   Geoffery Lyonsouglas Armonte Tortorella, MD  budesonide-formoterol [facility](SYMBICORT) 160-4.5 MCG/ACT inhaler Inhale 2 puffs into the lungs 2 (two) times daily. 05/09/14   Geoffery Lyonsouglas Makalya Nave, MD  famotidine (PEPCID) 20 MG tablet Take 1 tablet (20 mg total) by mouth at bedtime. 12/08/12   Vilinda BlanksWilliam S Minor, NP  fluticasone (FLONASE) 50 MCG/ACT nasal spray Place 2 sprays into the nose daily. 06/15/13   Kalman ShanMurali Ramaswamy, MD  ibuprofen (ADVIL,MOTRIN) 200 MG tablet Per bottle    Historical Provider, MD  ipratropium (ATROVENT) 0.02 % nebulizer solution Take 2.5 mLs (0.5 mg total) by nebulization  4 (four) times daily. 05/09/14   Geoffery Lyons, MD  ipratropium-albuterol (DUONEB) 0.5-2.5 (3) MG/3ML SOLN USE ONE VIAL IN NEBULIZER TWICE DAILY AS DIRECTED 08/31/13   Lazaro Arms, MD  lansoprazole (PREVACID) 15 MG capsule Take 1 capsule (15 mg total) by mouth daily. 08/13/13   Kalman Shan, MD  loratadine (CLARITIN) 10 MG tablet Take 10 mg by mouth daily.    Historical Provider, MD  mometasone-formoterol (DULERA) 200-5 MCG/ACT AERO Inhale 2 puffs into the lungs 2 (two) times daily. 05/12/13    Kalman Shan, MD  mometasone-formoterol (DULERA) 200-5 MCG/ACT AERO Inhale 2 puffs into the lungs 2 (two) times daily. 08/30/13   Kalman Shan, MD  predniSONE (DELTASONE) 10 MG tablet Take 60 mg for three days,  then 40 mg for three days,  Then 20 mg daily for three days. 05/09/14   Geoffery Lyons, MD   BP 128/87  Pulse 89  Temp(Src) 97.8 F (36.6 C) (Oral)  Resp 15  SpO2 100%  LMP 05/05/2014 Physical Exam  Nursing note and vitals reviewed. Constitutional: She is oriented to person, place, and time. She appears well-developed and well-nourished. No distress.  HENT:  Head: Normocephalic and atraumatic.  Neck: Normal range of motion. Neck supple.  Cardiovascular: Normal rate and regular rhythm.  Exam reveals no gallop and no friction rub.   No murmur heard. Pulmonary/Chest: Effort normal. No respiratory distress. She has wheezes.  There are slight expiratory wheezes bilaterally. She is in no respiratory distress.  Abdominal: Soft. Bowel sounds are normal. She exhibits no distension. There is no tenderness.  Musculoskeletal: Normal range of motion.  Neurological: She is alert and oriented to person, place, and time.  Skin: Skin is warm and dry. She is not diaphoretic.    ED Course  Procedures (including critical care time) Labs Review Labs Reviewed  BASIC METABOLIC PANEL - Abnormal; Notable for the following:    Glucose, Bld 109 (*)    BUN 5 (*)    All other components within normal limits  CBC  POC URINE PREG, ED    Imaging Review No results found.   EKG Interpretation None      MDM   Final diagnoses:  Asthma exacerbation    She will be prescribed prednisone and solution of albuterol and Atrovent. She'll also be given an inhaler and provided a prescription for Symbicort. Case management has spoken with her and will attempt to make arrangements for her to have assistance with her prescriptions.    Geoffery Lyons, MD 05/10/14 [phone]

## 2014-05-11 ENCOUNTER — Ambulatory Visit: Payer: Self-pay | Attending: Internal Medicine

## 2014-05-12 NOTE — Progress Notes (Addendum)
Late Entry:  ED CM consulted concerning f/u care and possible medication assistance. Pt presented to [facility] ED with exacerbation of asthma. Pt reports she has been out of her asthma meds pt states, she cannot afford her meds due to being  uninsured. Discussed the importance and benefits of PCP, patient verbalized understanding and  Agrees. Provided the list of affordable PCP in [place]. Pt interested in [facility] and it's services. [facility] Brochure given with information address and phone number. Pt instructed to walk in anytime between 9-5:30p to schedule an appointment. Pt verbalized understanding teach back done. Pt  appreciative of assistance.Explained once she has scheduled an appointment she could use [facility]. Dr. Judd Lien made aware patient will be following up with the [facility] and he is agreeable with plan. No further ED CM needs identified.

## 2014-05-18 ENCOUNTER — Other Ambulatory Visit: Payer: Self-pay | Admitting: Internal Medicine

## 2014-05-18 MED ORDER — BUDESONIDE-FORMOTEROL FUMARATE 160-4.5 MCG/ACT IN AERO: 2.0000 | INHALATION_SPRAY | Freq: Two times a day (BID) | RESPIRATORY_TRACT | Status: DC

## 2014-05-18 MED ORDER — ALBUTEROL SULFATE HFA 108 (90 BASE) MCG/ACT IN AERS: 1.0000 | INHALATION_SPRAY | Freq: Four times a day (QID) | RESPIRATORY_TRACT | Status: DC | PRN

## 2014-05-23 ENCOUNTER — Ambulatory Visit: Payer: Self-pay | Attending: Internal Medicine | Admitting: Internal Medicine

## 2014-05-23 ENCOUNTER — Encounter: Payer: Self-pay | Admitting: Internal Medicine

## 2014-05-23 VITALS — BP 112/78 | HR 79 | Temp 98.2°F | Resp 14 | Ht 67.0 in | Wt 182.2 lb

## 2014-05-23 DIAGNOSIS — J4540 Moderate persistent asthma, uncomplicated: Secondary | ICD-10-CM

## 2014-05-23 DIAGNOSIS — Z7689 Persons encountering health services in other specified circumstances: Secondary | ICD-10-CM

## 2014-05-23 DIAGNOSIS — Z7189 Other specified counseling: Secondary | ICD-10-CM

## 2014-05-23 DIAGNOSIS — J45909 Unspecified asthma, uncomplicated: Secondary | ICD-10-CM

## 2014-05-23 DIAGNOSIS — Z87891 Personal history of nicotine dependence: Secondary | ICD-10-CM | POA: Insufficient documentation

## 2014-05-23 DIAGNOSIS — Z0000 Encounter for general adult medical examination without abnormal findings: Secondary | ICD-10-CM | POA: Insufficient documentation

## 2014-05-23 NOTE — Progress Notes (Signed)
Patient here to follow up on difficulty breathing d/t asthma and establish care.  Denies shortness of breath.

## 2014-05-23 NOTE — Patient Instructions (Signed)
DASH Diet  The DASH diet stands for "Dietary Approaches to Stop Hypertension." It is a healthy eating plan that has been shown to reduce high blood pressure (hypertension) in as little as 14 days, while also possibly providing other significant health benefits. These other health benefits include reducing the risk of breast cancer after menopause and reducing the risk of type 2 diabetes, heart disease, colon cancer, and stroke. Health benefits also include weight loss and slowing kidney failure in patients with chronic kidney disease.   DIET GUIDELINES  · Limit salt (sodium). Your diet should contain less than 1500 mg of sodium daily.  · Limit refined or processed carbohydrates. Your diet should include mostly whole grains. Desserts and added sugars should be used sparingly.  · Include small amounts of heart-healthy fats. These types of fats include nuts, oils, and tub margarine. Limit saturated and trans fats. These fats have been shown to be harmful in the body.  CHOOSING FOODS   The following food groups are based on a 2000 calorie diet. See your Registered Dietitian for individual calorie needs.  Grains and Grain Products (6 to 8 servings daily)  · Eat More Often: Whole-wheat bread, brown rice, whole-grain or wheat pasta, quinoa, popcorn without added fat or salt (air popped).  · Eat Less Often: White bread, white pasta, white rice, cornbread.  Vegetables (4 to 5 servings daily)  · Eat More Often: Fresh, frozen, and canned vegetables. Vegetables may be raw, steamed, roasted, or grilled with a minimal amount of fat.  · Eat Less Often/Avoid: Creamed or fried vegetables. Vegetables in a cheese sauce.  Fruit (4 to 5 servings daily)  · Eat More Often: All fresh, canned (in natural juice), or frozen fruits. Dried fruits without added sugar. One hundred percent fruit juice (½ cup [237 mL] daily).  · Eat Less Often: Dried fruits with added sugar. Canned fruit in light or heavy syrup.  Lean Meats, Fish, and Poultry (2  servings or less daily. One serving is 3 to 4 oz [85-114 g]).  · Eat More Often: Ninety percent or leaner ground beef, tenderloin, sirloin. Round cuts of beef, chicken breast, turkey breast. All fish. Grill, bake, or broil your meat. Nothing should be fried.  · Eat Less Often/Avoid: Fatty cuts of meat, turkey, or chicken leg, thigh, or wing. Fried cuts of meat or fish.  Dairy (2 to 3 servings)  · Eat More Often: Low-fat or fat-free milk, low-fat plain or light yogurt, reduced-fat or part-skim cheese.  · Eat Less Often/Avoid: Milk (whole, 2%). Whole milk yogurt. Full-fat cheeses.  Nuts, Seeds, and Legumes (4 to 5 servings per week)  · Eat More Often: All without added salt.  · Eat Less Often/Avoid: Salted nuts and seeds, canned beans with added salt.  Fats and Sweets (limited)  · Eat More Often: Vegetable oils, tub margarines without trans fats, sugar-free gelatin. Mayonnaise and salad dressings.  · Eat Less Often/Avoid: Coconut oils, palm oils, butter, stick margarine, cream, half and half, cookies, candy, pie.  FOR MORE INFORMATION  The Dash Diet Eating Plan: www.dashdiet.org  Document Released: 11/14/2011 Document Revised: 02/17/2012 Document Reviewed: 11/14/2011  ExitCare® Patient Information ©2014 [employer].

## 2014-05-23 NOTE — Progress Notes (Signed)
Patient ID: Katrina Mitchell, female   DOB: 09/25/1981, 33 y.o.   MRN: [id number]   HQI:[id number]:[id number]  XLK:[id number]  DOB - 08/08/1981  CC: Establish care  HPI: Katrina BeckersSarah Mitchell is a 33 y.o. female here today to establish medical care.  Patient was seen in ER 2 weeks ago and for asthma exacerbation.  Patient reports that she was out of her medication. Patient has been under the care of a pulmonologist in th past for asthma. She reports that she ocassionaly forget to take maintenance inhaler.  She has not used albuterol in over 1 week.  Has been hospitalized one time for asthma and has a history of a pneumothorax in 2013.  Last pap smear 2 years ago, which was normal.  Up to date on immunizations.   No Known Allergies Past Medical History  Diagnosis Date  . Asthma   . Pregnancy as incidental finding   . Complication of anesthesia   . GERD (gastroesophageal reflux disease)    Current Outpatient Prescriptions on File Prior to Visit  Medication Sig Dispense Refill  . acetaminophen (TYLENOL) 325 MG tablet Per bottle      . albuterol (PROVENTIL HFA;VENTOLIN HFA) 108 (90 BASE) MCG/ACT inhaler Inhale 1-2 puffs into the lungs every 6 (six) hours as needed for wheezing.  3 Inhaler  3  . albuterol (PROVENTIL) (2.5 MG/3ML) 0.083% nebulizer solution Take 3 mLs (2.5 mg total) by nebulization every 6 (six) hours as needed for wheezing.  75 mL  3  . budesonide-formoterol (SYMBICORT) 160-4.5 MCG/ACT inhaler Inhale 2 puffs into the lungs 2 (two) times daily.  3 Inhaler  3  . famotidine (PEPCID) 20 MG tablet Take 1 tablet (20 mg total) by mouth at bedtime.      . fluticasone (FLONASE) 50 MCG/ACT nasal spray Place 2 sprays into the nose daily.  16 g  6  . ibuprofen (ADVIL,MOTRIN) 200 MG tablet Per bottle      . ipratropium (ATROVENT) 0.02 % nebulizer solution Take 2.5 mLs (0.5 mg total) by nebulization 4 (four) times daily.  30 mL  2  . ipratropium-albuterol (DUONEB) 0.5-2.5 (3) MG/3ML SOLN USE ONE VIAL IN NEBULIZER  TWICE DAILY AS DIRECTED  3 mL  11  . lansoprazole (PREVACID) 15 MG capsule Take 1 capsule (15 mg total) by mouth daily.  30 capsule  6  . predniSONE (DELTASONE) 10 MG tablet Take 60 mg for three days,  then 40 mg for three days,  Then 20 mg daily for three days.  36 tablet  0   No current facility-administered medications on file prior to visit.   No family history on file. History   Social History  . Marital Status: Divorced    Spouse Name: N/A    Number of Children: N/A  . Years of Education: N/A   Occupational History  . Not on file.   Social History Main Topics  . Smoking status: Former Smoker -- 1.00 packs/day for 2 years    Types: Cigarettes    Quit date: 07/10/2011  . Smokeless tobacco: Never Used  . Alcohol Use: No     Comment: weekly  . Drug Use: Yes     Comment: reports smokes "here and there"  . Sexual Activity: Yes    Birth Control/ Protection: Other-see comments     Comment: 6 weeks postpartum   Other Topics Concern  . Not on file   Social History Narrative  . No narrative on file    Review of Systems:  Review of Systems  Constitutional: Negative.   HENT: Negative.   Eyes: Negative.   Respiratory: Positive for shortness of breath. Negative for cough, hemoptysis, sputum production and wheezing.   Cardiovascular: Negative.        Hx of varicose veins   Gastrointestinal: Positive for heartburn.  Genitourinary: Negative.   Musculoskeletal: Negative.   Skin: Negative.   Neurological: Negative.   Endo/Heme/Allergies: Negative.   Psychiatric/Behavioral: Positive for depression (on effexor, previously being managed by psych in [place] ). The patient is nervous/anxious.       Objective:   Filed Vitals:   05/23/14 1127  BP: 112/78  Pulse: 79  Temp: 98.2 F (36.8 C)  Resp: 14    Physical Exam: Constitutional: Patient appears well-developed and well-nourished. No distress. HENT: Normocephalic, atraumatic, External right and left ear normal.  Oropharynx is clear and moist.  Eyes: Conjunctivae and EOM are normal. PERRLA, no scleral icterus. Neck: Normal ROM. Neck supple. No JVD. No tracheal deviation. No thyromegaly. CVS: RRR, S1/S2 +, no murmurs, no gallops, no carotid bruit.  Pulmonary: Effort and breath sounds normal, no stridor, rhonchi, wheezes, rales.  Abdominal: Soft. BS +, no distension, tenderness, rebound or guarding.  Musculoskeletal: Normal range of motion. No edema and no tenderness.  Lymphadenopathy: No lymphadenopathy noted, cervical Neuro: Alert. Normal reflexes, muscle tone coordination. No cranial nerve deficit. Skin: Skin is warm and dry. No rash noted. Not diaphoretic. No erythema. No pallor. Psychiatric: Normal mood and affect. Behavior, judgment, thought content normal.  Lab Results  Component Value Date   WBC 5.8 05/09/2014   HGB 12.7 05/09/2014   HCT 37.6 05/09/2014   MCV 83.2 05/09/2014   PLT 280 05/09/2014   Lab Results  Component Value Date   CREATININE 0.61 05/09/2014   BUN 5* 05/09/2014   NA 139 05/09/2014   K 4.1 05/09/2014   CL 104 05/09/2014   CO2 21 05/09/2014    No results found for this basename: HGBA1C   Lipid Panel  No results found for this basename: chol, trig, hdl, cholhdl, vldl, ldlcalc       Assessment and plan:   [name] was seen today for no specified reason.  Diagnoses and associated orders for this visit:  Encounter to establish care - TSH; Future - Lipid panel; Future - Hemoglobin A1c; Future - Vitamin D, 25-hydroxy  Moderate persistent asthma, high risk. Status post intubation December 2013 - Ambulatory referral to Pulmonology     Return for pap and breast exam.   Holland CommonsKECK, VALERIE, NP-C [facility] [phone] 05/24/2014, 11:48 AM

## 2014-05-24 LAB — VITAMIN D 25 HYDROXY (VIT D DEFICIENCY, FRACTURES): Vit D, 25-Hydroxy: 24 ng/mL — ABNORMAL LOW (ref 30–89)

## 2014-05-25 ENCOUNTER — Telehealth: Payer: Self-pay

## 2014-05-25 NOTE — Telephone Encounter (Signed)
Patient not available Left message on voice mail to return our call 

## 2014-05-25 NOTE — Telephone Encounter (Signed)
Message copied by Lestine MountJUAREZ, DENISE L on Wed May 25, 2014 12:42 PM ------      Message from: Ambrose FinlandKECK, VALERIE A      Created: Tue May 24, 2014 10:24 PM       Patient may get OTC Vitamin D 1,000IU and take daily. Thanks ------

## 2014-06-06 ENCOUNTER — Ambulatory Visit: Payer: Medicaid Other | Admitting: Internal Medicine

## 2014-07-28 ENCOUNTER — Telehealth: Payer: Self-pay | Admitting: Internal Medicine

## 2014-07-28 ENCOUNTER — Emergency Department: Payer: Self-pay | Admitting: Emergency Medicine

## 2014-07-28 NOTE — Telephone Encounter (Signed)
Pt. Is requesting a prescription for predniSONE (DELTASONE) 10 MG tablet, pt states that she ran out of Symbicort and had trouble breathing. Pt. Came to pick up Symbicort today 07/28/2014 but Pt. States that she is concerned that Symbicort will not be able to help her asthma. Please f/u with pt.

## 2014-07-29 ENCOUNTER — Telehealth: Payer: Self-pay | Admitting: Emergency Medicine

## 2014-07-29 NOTE — Telephone Encounter (Signed)
Left message for pt to call in regards to medication needs

## 2014-08-12 ENCOUNTER — Emergency Department: Payer: Self-pay | Admitting: Emergency Medicine

## 2014-08-12 LAB — CBC WITH DIFFERENTIAL/PLATELET
Basophil #: 0.1 x10 3/mm 3
Basophil %: 0.9 %
Eosinophil #: 0.4 x10 3/mm 3
Eosinophil %: 4.3 %
HCT: 38.5 %
HGB: 12.5 g/dL
Lymphocyte %: 29.2 %
Lymphs Abs: 2.8 x10 3/mm 3
MCH: 27.7 pg
MCHC: 32.4 g/dL
MCV: 86 fL
Monocyte #: 0.6 "x10 3/mm "
Monocyte %: 6 %
Neutrophil #: 5.7 x10 3/mm 3
Neutrophil %: 59.6 %
Platelet: 268 x10 3/mm 3
RBC: 4.5 X10 6/mm 3
RDW: 13.9 %
WBC: 9.6 x10 3/mm 3

## 2014-08-12 LAB — TROPONIN I: Troponin-I: <0.02 ng/mL

## 2014-08-12 LAB — COMPREHENSIVE METABOLIC PANEL
AST: 19 U/L (ref 15–37)
Albumin: 3.2 g/dL — ABNORMAL LOW (ref 3.4–5.0)
Alkaline Phosphatase: 54 U/L
Anion Gap: 8 (ref 7–16)
BILIRUBIN TOTAL: 0.1 mg/dL — AB (ref 0.2–1.0)
BUN: 8 mg/dL (ref 7–18)
CHLORIDE: 106 mmol/L (ref 98–107)
CREATININE: 0.73 mg/dL (ref 0.60–1.30)
Calcium, Total: 8.3 mg/dL — ABNORMAL LOW (ref 8.5–10.1)
Co2: 26 mmol/L (ref 21–32)
Glucose: 87 mg/dL (ref 65–99)
Osmolality: 277 (ref 275–301)
Potassium: 3.8 mmol/L (ref 3.5–5.1)
SGPT (ALT): 17 U/L
SODIUM: 140 mmol/L (ref 136–145)
TOTAL PROTEIN: 6.9 g/dL (ref 6.4–8.2)

## 2014-08-12 LAB — URINALYSIS, COMPLETE
Bacteria: NONE SEEN
Bilirubin,UR: NEGATIVE
Blood: NEGATIVE
Glucose,UR: NEGATIVE mg/dL
Ketone: NEGATIVE
Leukocyte Esterase: NEGATIVE
Nitrite: NEGATIVE
Ph: 5
Protein: NEGATIVE
RBC,UR: NONE SEEN /HPF
Specific Gravity: 1.011
Squamous Epithelial: NONE SEEN
WBC UR: <1 /HPF

## 2014-08-12 LAB — PROTIME-INR
INR: 1
Prothrombin Time: 13.2 secs (ref 11.5–14.7)

## 2014-08-12 LAB — APTT: Activated PTT: 27.9 s

## 2014-10-10 ENCOUNTER — Encounter: Payer: Self-pay | Admitting: Internal Medicine

## 2015-01-25 ENCOUNTER — Emergency Department: Payer: Self-pay | Admitting: Student

## 2015-01-27 ENCOUNTER — Other Ambulatory Visit: Payer: Self-pay | Admitting: Internal Medicine

## 2015-01-27 NOTE — Telephone Encounter (Signed)
Pt is hoping to get a sample medication refill for budesonide-formoterol (SYMBICORT) 160-4.5 MCG/ACT inhaler until she is able to be seen next Monday. Please follow up with pt.

## 2015-01-30 MED ORDER — BUDESONIDE-FORMOTEROL FUMARATE 160-4.5 MCG/ACT IN AERO: 2.0000 | INHALATION_SPRAY | Freq: Two times a day (BID) | RESPIRATORY_TRACT | Status: DC

## 2015-01-30 NOTE — Telephone Encounter (Signed)
[facility]

## 2015-02-06 ENCOUNTER — Ambulatory Visit: Payer: Self-pay | Attending: Internal Medicine | Admitting: Internal Medicine

## 2015-02-06 ENCOUNTER — Encounter: Payer: Self-pay | Admitting: Internal Medicine

## 2015-02-06 VITALS — BP 109/74 | HR 85 | Temp 98.8°F | Resp 14 | Ht 67.0 in | Wt 202.0 lb

## 2015-02-06 DIAGNOSIS — Z79899 Other long term (current) drug therapy: Secondary | ICD-10-CM | POA: Insufficient documentation

## 2015-02-06 DIAGNOSIS — Z7951 Long term (current) use of inhaled steroids: Secondary | ICD-10-CM | POA: Insufficient documentation

## 2015-02-06 DIAGNOSIS — J4540 Moderate persistent asthma, uncomplicated: Secondary | ICD-10-CM | POA: Insufficient documentation

## 2015-02-06 DIAGNOSIS — R109 Unspecified abdominal pain: Secondary | ICD-10-CM

## 2015-02-06 DIAGNOSIS — N3090 Cystitis, unspecified without hematuria: Secondary | ICD-10-CM | POA: Insufficient documentation

## 2015-02-06 LAB — POCT URINALYSIS DIPSTICK
BILIRUBIN UA: NEGATIVE
Blood, UA: NEGATIVE
GLUCOSE UA: NEGATIVE
Ketones, UA: NEGATIVE
Nitrite, UA: NEGATIVE
Protein, UA: NEGATIVE
Spec Grav, UA: >=1.03
UROBILINOGEN UA: 0.2
pH, UA: 5.5

## 2015-02-06 MED ORDER — FLUTICASONE PROPIONATE 50 MCG/ACT NA SUSP: 2.0000 | Freq: Every day | NASAL | Status: DC

## 2015-02-06 MED ORDER — MOMETASONE FURO-FORMOTEROL FUM 200-5 MCG/ACT IN AERO: 2.0000 | INHALATION_SPRAY | Freq: Two times a day (BID) | RESPIRATORY_TRACT | Status: DC

## 2015-02-06 MED ORDER — CIPROFLOXACIN HCL 500 MG PO TABS: 500.0000 mg | ORAL_TABLET | Freq: Two times a day (BID) | ORAL | Status: DC

## 2015-02-06 NOTE — Progress Notes (Signed)
   Subjective:    Patient ID: Katrina Mitchell, female    DOB: 12/09/1980, 34 y.o.   MRN: [id number]  Asthma She complains of shortness of breath. There is no chest tightness, cough, hoarse voice or wheezing. This is a chronic problem. The current episode started 1 to 4 weeks ago. The problem occurs every several days (2 weekly albuterol use). The problem has been unchanged. Pertinent negatives include no chest pain, dyspnea on exertion, postnasal drip or trouble swallowing. Her symptoms are aggravated by change in weather. Her symptoms are alleviated by steroid inhaler and oral steroids. She reports moderate improvement on treatment. There are no known risk factors for lung disease. Her past medical history is significant for asthma. There is no history of bronchiectasis, COPD or emphysema.  Urinary Tract Infection  This is a new problem. The current episode started in the past 7 days. The problem occurs intermittently. The problem has been unchanged. The quality of the pain is described as aching. There has been no fever. She is sexually active. There is no history of pyelonephritis. Associated symptoms include flank pain (upon awakening in AM). Pertinent negatives include no chills, frequency, hesitancy, possible pregnancy, urgency or vomiting. She has tried nothing for the symptoms.      Review of Systems  Constitutional: Negative for chills.  HENT: Negative for hoarse voice, postnasal drip and trouble swallowing.   Respiratory: Positive for shortness of breath. Negative for cough and wheezing.   Cardiovascular: Negative for chest pain and dyspnea on exertion.  Gastrointestinal: Negative for vomiting.  Genitourinary: Positive for flank pain (upon awakening in AM). Negative for dysuria, hesitancy, urgency and frequency.  All other systems reviewed and are negative.      Objective:   Physical Exam  Constitutional: She is oriented to person, place, and time.  Cardiovascular: Normal rate,  regular rhythm and normal heart sounds.   Pulmonary/Chest: Effort normal and breath sounds normal. She has no wheezes.  Abdominal: Soft. Bowel sounds are normal. There is no tenderness.  Musculoskeletal: She exhibits no tenderness.  No cva tenderness  Neurological: She is alert and oriented to person, place, and time.  Skin: Skin is warm.  Psychiatric: She has a normal mood and affect.          Assessment & Plan:  [name] was seen today for follow-up.  Diagnoses and all orders for this visit:  Moderate persistent asthma, uncomplicated Orders: -     fluticasone (FLONASE) 50 MCG/ACT nasal spray; Place 2 sprays into both nostrils daily. -     mometasone-formoterol (DULERA) 200-5 MCG/ACT AERO; Inhale 2 puffs into the lungs 2 (two) times daily. Patient is unable to get Symbicort through [facility] program for at least 3 weeks. We currently do not have any samples and the patient only has 10 uses left. I will give her Dulera to hold her until she is able to get the Symbicort. After Symbicort arrives she may then switch back and keep Dulera in case she ever runs out again.   Flank pain Orders: -     POCT urinalysis dipstick--positive Leukocytes   Cystitis Orders: -    Begin ciprofloxacin (CIPRO) 500 MG tablet; Take 1 tablet (500 mg total) by mouth 2 (two) times daily.  Return if symptoms worsen or fail to improve.  Holland CommonsKECK, Juma Oxley, NP 02/07/2015 10:30 AM

## 2015-02-06 NOTE — Progress Notes (Signed)
Pt is here following up on her asthma. Pt needs her medications refilled.

## 2015-02-06 NOTE — Patient Instructions (Signed)
You have been given Dulera to hold you over until you are able to get the Symbicort from the drug company. Once you receive the Symbicort you may switch back and stop Dulera.

## 2015-03-02 ENCOUNTER — Ambulatory Visit: Payer: Medicaid Other | Attending: Internal Medicine

## 2015-03-28 ENCOUNTER — Ambulatory Visit: Payer: Medicaid Other | Attending: Internal Medicine

## 2015-03-30 ENCOUNTER — Encounter: Payer: Self-pay | Admitting: Internal Medicine

## 2015-03-30 ENCOUNTER — Ambulatory Visit: Payer: Self-pay | Attending: Internal Medicine | Admitting: Internal Medicine

## 2015-03-30 VITALS — BP 126/81 | HR 100 | Temp 98.3°F | Ht 67.0 in | Wt 198.6 lb

## 2015-03-30 DIAGNOSIS — L600 Ingrowing nail: Secondary | ICD-10-CM

## 2015-03-30 DIAGNOSIS — B351 Tinea unguium: Secondary | ICD-10-CM

## 2015-03-30 DIAGNOSIS — L301 Dyshidrosis [pompholyx]: Secondary | ICD-10-CM

## 2015-03-30 DIAGNOSIS — N898 Other specified noninflammatory disorders of vagina: Secondary | ICD-10-CM

## 2015-03-30 DIAGNOSIS — J4541 Moderate persistent asthma with (acute) exacerbation: Secondary | ICD-10-CM

## 2015-03-30 MED ORDER — TRIAMCINOLONE ACETONIDE 0.1 % EX CREA: 1.0000 "application " | TOPICAL_CREAM | Freq: Two times a day (BID) | CUTANEOUS | Status: DC

## 2015-03-30 NOTE — Progress Notes (Signed)
Asthma is controlled on current regiment. Currently taking Duoneb as needed, dulera daily does well.  Patient complains of toe fungus on left big toe.  This has been getting worse.  Patient has tried OTC and it has not helped.

## 2015-03-30 NOTE — Progress Notes (Signed)
Patient ID: [name], female   DOB: 01/20/1981, 34 y.o.   MRN: [id number]  CC: asthma, rash, toenail  HPI: Katrina Mitchell is a 34 y.o. female here today for a follow up visit.  Patient has past medical history of asthma and GERD.  She reports that she has continued to [facility] because she has had problems She reports that she is only using albuterol twice weekly versus once daily since being on Dulera. She is concerned about a TIA she had in September but never followed up with Neurology after that. She denies any similar events since then.   Eczema flare only on hands. Usually happens more in Winter annually. Been present for one week now.  Does use chemicals at work.   Has left ingrown toenail that is painful with fungus. She would like removal and treatment.   Has been having flank pain, odor, and occasional discharge. Cloudy thin color vaginal discharge.   Patient has No headache, No chest pain, No abdominal pain - No Nausea, No new weakness tingling or numbness, No Cough - SOB.  No Known Allergies Past Medical History  Diagnosis Date  . Asthma   . Pregnancy as incidental finding   . Complication of anesthesia   . GERD (gastroesophageal reflux disease)    Current Outpatient Prescriptions on File Prior to Visit  Medication Sig Dispense Refill  . acetaminophen (TYLENOL) 325 MG tablet Per bottle    . albuterol (PROVENTIL HFA;VENTOLIN HFA) 108 (90 BASE) MCG/ACT inhaler Inhale 1-2 puffs into the lungs every 6 (six) hours as needed for wheezing. 3 Inhaler 3  . esomeprazole (NEXIUM) 20 MG capsule Take 20 mg by mouth daily at 12 noon.    . famotidine (PEPCID) 20 MG tablet Take 1 tablet (20 mg total) by mouth at bedtime.    . fexofenadine (ALLEGRA) 60 MG tablet Take 60 mg by mouth 2 (two) times daily.    . fluticasone (FLONASE) 50 MCG/ACT nasal spray Place 2 sprays into both nostrils daily. 16 g 6  . ibuprofen (ADVIL,MOTRIN) 200 MG tablet Per bottle    . ipratropium (ATROVENT) 0.02  % nebulizer solution Take 2.5 mLs (0.5 mg total) by nebulization 4 (four) times daily. 30 mL 2  . ipratropium-albuterol (DUONEB) 0.5-2.5 (3) MG/3ML SOLN USE ONE VIAL IN NEBULIZER TWICE DAILY AS DIRECTED 3 mL 11  . mometasone-formoterol (DULERA) 200-5 MCG/ACT AERO Inhale 2 puffs into the lungs 2 (two) times daily. 1 Inhaler 1  . albuterol (PROVENTIL) (2.5 MG/3ML) 0.083% nebulizer solution Take 3 mLs (2.5 mg total) by nebulization every 6 (six) hours as needed for wheezing. 75 mL 3   No current facility-administered medications on file prior to visit.   History reviewed. No pertinent family history. History   Social History  . Marital Status: Single    Spouse Name: N/A  . Number of Children: N/A  . Years of Education: N/A   Occupational History  . Not on file.   Social History Main Topics  . Smoking status: Former Smoker -- 1.00 packs/day for 2 years    Types: Cigarettes    Quit date: 07/10/2011  . Smokeless tobacco: Never Used  . Alcohol Use: No     Comment: weekly  . Drug Use: Yes     Comment: reports smokes "here and there"  . Sexual Activity: Yes    Birth Control/ Protection: Other-see comments     Comment: 6 weeks postpartum   Other Topics Concern  . Not on file  Social History Narrative    Review of Systems: See HPI   Objective:   Filed Vitals:   03/30/15 1412  BP: 126/81  Pulse: 100  Temp: 98.3 F (36.8 C)    Physical Exam  Cardiovascular: Normal rate, regular rhythm and normal heart sounds.   Pulmonary/Chest: Effort normal and breath sounds normal. She has no wheezes.  Musculoskeletal:       Feet:  Skin: Rash (bilateral hands) noted.     Lab Results  Component Value Date   WBC 5.8 05/09/2014   HGB 12.7 05/09/2014   HCT 37.6 05/09/2014   MCV 83.2 05/09/2014   PLT 280 05/09/2014   Lab Results  Component Value Date   CREATININE 0.61 05/09/2014   BUN 5* 05/09/2014   NA 139 05/09/2014   K 4.1 05/09/2014   CL 104 05/09/2014   CO2 21  05/09/2014    No results found for: HGBA1C Lipid Panel  No results found for: CHOL, TRIG, HDL, CHOLHDL, VLDL, LDLCALC     Assessment and plan:   Katrina Mitchell was seen today for asthma, medication refill and nail problem.  Diagnoses and all orders for this visit:  Ingrown left greater toenail/Onychomycosis Orders: -     Ambulatory referral to Podiatry  Dyshidrotic eczema Orders: -     triamcinolone cream (KENALOG) 0.1 %; Apply 1 application topically 2 (two) times daily. Patient may try OTC exicipal cream. Alternate days with Kenalog cream.  Asthma with acute exacerbation, moderate persistent May continue with [facility] since it seems to give patient the most relief. Patient never made her Pulmonology appt. Will hold off for now since patient is uninsured. Will eventually need PFT's and spirometry.   Vaginal discharge Will address during pap next week.   Return for pap soon.      Holland Commons, NP-C [facility] [phone] 03/30/2015, 2:47 PM

## 2015-04-06 ENCOUNTER — Encounter: Payer: Self-pay | Admitting: Internal Medicine

## 2015-04-06 ENCOUNTER — Ambulatory Visit: Payer: Self-pay

## 2015-04-06 ENCOUNTER — Ambulatory Visit: Payer: Self-pay | Attending: Internal Medicine | Admitting: Internal Medicine

## 2015-04-06 VITALS — BP 108/72 | HR 82 | Temp 98.1°F | Resp 16 | Ht 67.0 in | Wt 200.0 lb

## 2015-04-06 DIAGNOSIS — Z7689 Persons encountering health services in other specified circumstances: Secondary | ICD-10-CM

## 2015-04-06 DIAGNOSIS — Z124 Encounter for screening for malignant neoplasm of cervix: Secondary | ICD-10-CM | POA: Insufficient documentation

## 2015-04-06 LAB — LIPID PANEL
Cholesterol: 231 mg/dL — ABNORMAL HIGH (ref 0–200)
HDL: 44 mg/dL — ABNORMAL LOW (ref >=46)
LDL Cholesterol: 155 mg/dL — ABNORMAL HIGH (ref 0–99)
TRIGLYCERIDES: 161 mg/dL — AB (ref <150)
Total CHOL/HDL Ratio: 5.3 Ratio
VLDL: 32 mg/dL (ref 0–40)

## 2015-04-06 LAB — TSH: TSH: 1.673 u[IU]/mL (ref 0.350–4.500)

## 2015-04-06 LAB — HEMOGLOBIN A1C
Hgb A1c MFr Bld: 5.7 % — ABNORMAL HIGH (ref <5.7)
Mean Plasma Glucose: 117 mg/dL — ABNORMAL HIGH (ref <117)

## 2015-04-06 NOTE — Progress Notes (Signed)
Pt is here for a pap smear. 

## 2015-04-06 NOTE — Patient Instructions (Signed)
Bacterial Vaginosis Bacterial vaginosis is a vaginal infection that occurs when the normal balance of bacteria in the vagina is disrupted. It results from an overgrowth of certain bacteria. This is the most common vaginal infection in women of childbearing age. Treatment is important to prevent complications, especially in pregnant women, as it can cause a premature delivery. CAUSES  Bacterial vaginosis is caused by an increase in harmful bacteria that are normally present in smaller amounts in the vagina. Several different kinds of bacteria can cause bacterial vaginosis. However, the reason that the condition develops is not fully understood. RISK FACTORS Certain activities or behaviors can put you at an increased risk of developing bacterial vaginosis, including:  Having a new sex partner or multiple sex partners.  Douching.  Using an intrauterine device (IUD) for contraception. Women do not get bacterial vaginosis from toilet seats, bedding, swimming pools, or contact with objects around them. SIGNS AND SYMPTOMS  Some women with bacterial vaginosis have no signs or symptoms. Common symptoms include:  Grey vaginal discharge.  A fishlike odor with discharge, especially after sexual intercourse.  Itching or burning of the vagina and vulva.  Burning or pain with urination. DIAGNOSIS  Your health care provider will take a medical history and examine the vagina for signs of bacterial vaginosis. A sample of vaginal fluid may be taken. Your health care provider will look at this sample under a microscope to check for bacteria and abnormal cells. A vaginal pH test may also be done.  TREATMENT  Bacterial vaginosis may be treated with antibiotic medicines. These may be given in the form of a pill or a vaginal cream. A second round of antibiotics may be prescribed if the condition comes back after treatment.  HOME CARE INSTRUCTIONS   Only take over-the-counter or prescription medicines as  directed by your health care provider.  If antibiotic medicine was prescribed, take it as directed. Make sure you finish it even if you start to feel better.  Do not have sex until treatment is completed.  Tell all sexual partners that you have a vaginal infection. They should see their health care provider and be treated if they have problems, such as a mild rash or itching.  Practice safe sex by using condoms and only having one sex partner. SEEK MEDICAL CARE IF:   Your symptoms are not improving after 3 days of treatment.  You have increased discharge or pain.  You have a fever. MAKE SURE YOU:   Understand these instructions.  Will watch your condition.  Will get help right away if you are not doing well or get worse. FOR MORE INFORMATION  Centers for Disease Control and Prevention, Division of STD Prevention: www.cdc.gov/std American Sexual Health Association (ASHA): www.ashastd.org  Document Released: 11/25/2005 Document Revised: 09/15/2013 Document Reviewed: 07/07/2013 ExitCare Patient Information 2015 [employer]. This information is not intended to replace advice given to you by your health care provider. Make sure you discuss any questions you have with your health care provider.  

## 2015-04-07 ENCOUNTER — Other Ambulatory Visit: Payer: Self-pay | Admitting: Internal Medicine

## 2015-04-07 DIAGNOSIS — J4540 Moderate persistent asthma, uncomplicated: Secondary | ICD-10-CM

## 2015-04-07 LAB — RPR

## 2015-04-07 LAB — CERVICOVAGINAL ANCILLARY ONLY
CHLAMYDIA, DNA PROBE: NEGATIVE
Neisseria Gonorrhea: NEGATIVE
WET PREP (BD AFFIRM): POSITIVE — AB

## 2015-04-07 LAB — HIV ANTIBODY (ROUTINE TESTING W REFLEX): HIV 1&2 Ab, 4th Generation: NONREACTIVE

## 2015-04-07 LAB — CYTOLOGY - PAP

## 2015-04-07 MED ORDER — ALBUTEROL SULFATE HFA 108 (90 BASE) MCG/ACT IN AERS: 1.0000 | INHALATION_SPRAY | Freq: Four times a day (QID) | RESPIRATORY_TRACT | Status: DC | PRN

## 2015-04-07 MED ORDER — MOMETASONE FURO-FORMOTEROL FUM 200-5 MCG/ACT IN AERO: 2.0000 | INHALATION_SPRAY | Freq: Two times a day (BID) | RESPIRATORY_TRACT | Status: DC

## 2015-04-09 NOTE — Progress Notes (Signed)
Patient ID: Katrina Mitchell, female   DOB: 01-29-1981, 34 y.o.   MRN: [id number]  CC: pap/breast exam   HPI: Katrina Mitchell is a 34 y.o. female here today for a pap and breast exam.  Patient has past medical history of asthma and GERD.  Patient denies vaginal discharge, dysuria, itch, odor, or lesions. She has no breast complaints today.   Patient has No headache, No chest pain, No abdominal pain - No Nausea, No new weakness tingling or numbness, No Cough - SOB.  No Known Allergies Past Medical History  Diagnosis Date  . Asthma   . Pregnancy as incidental finding   . Complication of anesthesia   . GERD (gastroesophageal reflux disease)    Current Outpatient Prescriptions on File Prior to Visit  Medication Sig Dispense Refill  . acetaminophen (TYLENOL) 325 MG tablet Per bottle    . esomeprazole (NEXIUM) 20 MG capsule Take 20 mg by mouth daily at 12 noon.    . famotidine (PEPCID) 20 MG tablet Take 1 tablet (20 mg total) by mouth at bedtime.    . fexofenadine (ALLEGRA) 60 MG tablet Take 60 mg by mouth 2 (two) times daily.    . fluticasone (FLONASE) 50 MCG/ACT nasal spray Place 2 sprays into both nostrils daily. 16 g 6  . triamcinolone cream (KENALOG) 0.1 % Apply 1 application topically 2 (two) times daily. 30 g 0  . albuterol (PROVENTIL) (2.5 MG/3ML) 0.083% nebulizer solution Take 3 mLs (2.5 mg total) by nebulization every 6 (six) hours as needed for wheezing. 75 mL 3  . ibuprofen (ADVIL,MOTRIN) 200 MG tablet Per bottle    . ipratropium (ATROVENT) 0.02 % nebulizer solution Take 2.5 mLs (0.5 mg total) by nebulization 4 (four) times daily. (Patient not taking: Reported on 04/06/2015) 30 mL 2  . ipratropium-albuterol (DUONEB) 0.5-2.5 (3) MG/3ML SOLN USE ONE VIAL IN NEBULIZER TWICE DAILY AS DIRECTED (Patient not taking: Reported on 04/06/2015) 3 mL 11   No current facility-administered medications on file prior to visit.   History reviewed. No pertinent family history. History   Social History   . Marital Status: Single    Spouse Name: N/A  . Number of Children: N/A  . Years of Education: N/A   Occupational History  . Not on file.   Social History Main Topics  . Smoking status: Former Smoker -- 1.00 packs/day for 2 years    Types: Cigarettes    Quit date: 07/10/2011  . Smokeless tobacco: Never Used  . Alcohol Use: No     Comment: weekly  . Drug Use: Yes     Comment: reports smokes "here and there"  . Sexual Activity: Yes    Birth Control/ Protection: Other-see comments     Comment: 6 weeks postpartum   Other Topics Concern  . Not on file   Social History Narrative    Review of Systems: See HPI   Objective:   Filed Vitals:   04/06/15 0913  BP: 108/72  Pulse: 82  Temp: 98.1 F (36.7 C)  Resp: 16    Physical Exam  Pulmonary/Chest: Right breast exhibits no mass. Left breast exhibits no mass. There is no breast swelling.  Abdominal: Soft. She exhibits no distension. There is no tenderness.  Genitourinary: Uterus normal. No breast tenderness or discharge. Cervix exhibits friability. Cervix exhibits no motion tenderness and no discharge. Right adnexum displays no tenderness. Left adnexum displays no tenderness. Vaginal discharge (thin white) found.  Lymphadenopathy:       Right: No  inguinal adenopathy present.       Left: No inguinal adenopathy present.     Lab Results  Component Value Date   WBC 9.6 08/12/2014   HGB 12.5 08/12/2014   HCT 38.5 08/12/2014   MCV 86 08/12/2014   PLT 268 08/12/2014   Lab Results  Component Value Date   CREATININE 0.73 08/12/2014   BUN 8 08/12/2014   NA 140 08/12/2014   K 3.8 08/12/2014   CL 106 08/12/2014   CO2 26 08/12/2014    Lab Results  Component Value Date   HGBA1C 5.7* 04/06/2015   Lipid Panel     Component Value Date/Time   CHOL 231* 04/06/2015 0914   TRIG 161* 04/06/2015 0914   HDL 44* 04/06/2015 0914   CHOLHDL 5.3 04/06/2015 0914   VLDL 32 04/06/2015 0914   LDLCALC 155* 04/06/2015 0914        Assessment and plan:   Katrina Mitchell was seen today for follow-up.  Diagnoses and all orders for this visit:  Papanicolaou smear Orders: -     Cytology - PAP [place] -     HIV antibody (with reflex) -     RPR -     Cervicovaginal ancillary only -     Cervicovaginal ancillary only  Keep scheduled follow up.       Holland CommonsKECK, Dominique Calvey, NP-C [facility] [phone] 04/09/2015, 10:17 PM

## 2015-04-11 ENCOUNTER — Telehealth: Payer: Self-pay | Admitting: *Deleted

## 2015-04-11 MED ORDER — METRONIDAZOLE 500 MG PO TABS: 500.0000 mg | ORAL_TABLET | Freq: Two times a day (BID) | ORAL | Status: DC

## 2015-04-11 NOTE — Telephone Encounter (Signed)
Pt is aware of her lab results.  

## 2015-04-11 NOTE — Telephone Encounter (Signed)
-----   Message from Ambrose FinlandValerie A Keck, NP sent at 04/10/2015 10:34 AM EDT ----- Patient positive for Bacterial Vaginosis . Please explain this is not a STD, but a imbalance of the vaginal pH. Please send Flagyl 500 mg BID for 7 days. 14 tables, No refills, no alcohol while on this medication. No STD's. Still waiting on cytology

## 2015-04-14 ENCOUNTER — Telehealth: Payer: Self-pay | Admitting: *Deleted

## 2015-04-14 NOTE — Telephone Encounter (Signed)
-----   Message from Ambrose FinlandValerie A Keck, NP sent at 04/13/2015  7:57 AM EDT ----- Cholesterol is extremely elevated. Please go over detailed lifestyle changes with her to avoid medication management in future

## 2015-04-14 NOTE — Telephone Encounter (Signed)
Left VM for pt to return my call  

## 2015-04-17 ENCOUNTER — Telehealth: Payer: Self-pay | Admitting: *Deleted

## 2015-04-17 NOTE — Telephone Encounter (Signed)
Tried reaching pt and her phone said that all circuits were busy. Unable to leave a VM.

## 2015-04-17 NOTE — Telephone Encounter (Signed)
-----   Message from Ambrose FinlandValerie A Keck, NP sent at 04/16/2015 11:11 PM EDT ----- HPV positive. Will need colpo scheduled with Family Medicine

## 2015-04-18 ENCOUNTER — Encounter: Payer: Self-pay | Admitting: Medical Oncology

## 2015-04-18 ENCOUNTER — Emergency Department: Payer: Self-pay

## 2015-04-18 ENCOUNTER — Emergency Department
Admission: EM | Admit: 2015-04-18 | Discharge: 2015-04-18 | Disposition: A | Discharge status: Home / Self Care | Payer: Self-pay | Attending: Emergency Medicine | Admitting: Emergency Medicine

## 2015-04-18 DIAGNOSIS — Z87891 Personal history of nicotine dependence: Secondary | ICD-10-CM | POA: Insufficient documentation

## 2015-04-18 DIAGNOSIS — Z79899 Other long term (current) drug therapy: Secondary | ICD-10-CM | POA: Insufficient documentation

## 2015-04-18 DIAGNOSIS — Z7952 Long term (current) use of systemic steroids: Secondary | ICD-10-CM | POA: Insufficient documentation

## 2015-04-18 DIAGNOSIS — Z7951 Long term (current) use of inhaled steroids: Secondary | ICD-10-CM | POA: Insufficient documentation

## 2015-04-18 DIAGNOSIS — J45901 Unspecified asthma with (acute) exacerbation: Secondary | ICD-10-CM | POA: Insufficient documentation

## 2015-04-18 LAB — BASIC METABOLIC PANEL
ANION GAP: 6 (ref 5–15)
BUN: 9 mg/dL (ref 6–20)
CHLORIDE: 105 mmol/L (ref 101–111)
CO2: 25 mmol/L (ref 22–32)
CREATININE: 0.61 mg/dL (ref 0.44–1.00)
Calcium: 8.9 mg/dL (ref 8.9–10.3)
GFR calc Af Amer: >60 mL/min (ref >60)
GFR calc non Af Amer: >60 mL/min (ref >60)
Glucose, Bld: 95 mg/dL (ref 65–99)
Potassium: 4.2 mmol/L (ref 3.5–5.1)
Sodium: 136 mmol/L (ref 135–145)

## 2015-04-18 LAB — CBC
HEMATOCRIT: 38.9 % (ref 35.0–47.0)
Hemoglobin: 13.1 g/dL (ref 12.0–16.0)
MCH: 26.4 pg (ref 26.0–34.0)
MCHC: 33.7 g/dL (ref 32.0–36.0)
MCV: 78.5 fL — ABNORMAL LOW (ref 80.0–100.0)
PLATELETS: 295 10*3/uL (ref 150–440)
RBC: 4.95 MIL/uL (ref 3.80–5.20)
RDW: 15 % — AB (ref 11.5–14.5)
WBC: 10.3 10*3/uL (ref 3.6–11.0)

## 2015-04-18 MED ORDER — IPRATROPIUM-ALBUTEROL 0.5-2.5 (3) MG/3ML IN SOLN
3.0000 mL | Freq: Once | RESPIRATORY_TRACT | Status: AC
  Administered 2015-04-18: 3 mL via RESPIRATORY_TRACT

## 2015-04-18 MED ORDER — PREDNISONE 20 MG PO TABS
40.0000 mg | ORAL_TABLET | Freq: Once | ORAL | Status: AC
  Administered 2015-04-18: 40 mg via ORAL

## 2015-04-18 MED ORDER — IPRATROPIUM-ALBUTEROL 0.5-2.5 (3) MG/3ML IN SOLN
3.0000 mL | Freq: Once | RESPIRATORY_TRACT | Status: AC
Start: 2015-04-18 — End: 2015-04-18
  Administered 2015-04-18: 3 mL via RESPIRATORY_TRACT

## 2015-04-18 MED ORDER — PREDNISONE 20 MG PO TABS: 40.0000 mg | ORAL_TABLET | Freq: Every day | ORAL | Status: DC

## 2015-04-18 MED ORDER — PREDNISONE 20 MG PO TABS
ORAL_TABLET | ORAL | Status: AC
  Administered 2015-04-18: 40 mg via ORAL
  Filled 2015-04-18: qty 2

## 2015-04-18 MED ORDER — IPRATROPIUM-ALBUTEROL 0.5-2.5 (3) MG/3ML IN SOLN
RESPIRATORY_TRACT | Status: AC
  Administered 2015-04-18: 3 mL via RESPIRATORY_TRACT
  Filled 2015-04-18: qty 9

## 2015-04-18 NOTE — Discharge Instructions (Signed)
Please seek medical attention for any high fevers, chest pain, shortness of breath, change in behavior, persistent vomiting, bloody stool or any other new or concerning symptoms. ° °Asthma, Acute Bronchospasm °Acute bronchospasm caused by asthma is also referred to as an asthma attack. Bronchospasm means your air passages become narrowed. The narrowing is caused by inflammation and tightening of the muscles in the air tubes (bronchi) in your lungs. This can make it hard to breathe or cause you to wheeze and cough. °CAUSES °Possible triggers are: °· Animal dander from the skin, hair, or feathers of animals. °· Dust mites contained in house dust. °· Cockroaches. °· Pollen from trees or grass. °· Mold. °· Cigarette or tobacco smoke. °· Air pollutants such as dust, household cleaners, hair sprays, aerosol sprays, paint fumes, strong chemicals, or strong odors. °· Cold air or weather changes. Cold air may trigger inflammation. Winds increase molds and pollens in the air. °· Strong emotions such as crying or laughing hard. °· Stress. °· Certain medicines such as aspirin or beta-blockers. °· Sulfites in foods and drinks, such as dried fruits and wine. °· Infections or inflammatory conditions, such as a flu, cold, or inflammation of the nasal membranes (rhinitis). °· Gastroesophageal reflux disease (GERD). GERD is a condition where stomach acid backs up into your esophagus. °· Exercise or strenuous activity. °SIGNS AND SYMPTOMS  °· Wheezing. °· Excessive coughing, particularly at night. °· Chest tightness. °· Shortness of breath. °DIAGNOSIS  °Your health care provider will ask you about your medical history and perform a physical exam. A chest X-ray or blood testing may be performed to look for other causes of your symptoms or other conditions that may have triggered your asthma attack.  °TREATMENT  °Treatment is aimed at reducing inflammation and opening up the airways in your lungs.  Most asthma attacks are treated with  inhaled medicines. These include quick relief or rescue medicines (such as bronchodilators) and controller medicines (such as inhaled corticosteroids). These medicines are sometimes given through an inhaler or a nebulizer. Systemic steroid medicine taken by mouth or given through an IV tube also can be used to reduce the inflammation when an attack is moderate or severe. Antibiotic medicines are only used if a bacterial infection is present.  °HOME CARE INSTRUCTIONS  °· Rest. °· Drink plenty of liquids. This helps the mucus to remain thin and be easily coughed up. Only use caffeine in moderation and do not use alcohol until you have recovered from your illness. °· Do not smoke. Avoid being exposed to secondhand smoke. °· You play a critical role in keeping yourself in good health. Avoid exposure to things that cause you to wheeze or to have breathing problems. °· Keep your medicines up-to-date and available. Carefully follow your health care provider's treatment plan. °· Take your medicine exactly as prescribed. °· When pollen or pollution is bad, keep windows closed and use an air conditioner or go to places with air conditioning. °· Asthma requires careful medical care. See your health care provider for a follow-up as advised. If you are more than [redacted] weeks pregnant and you were prescribed any new medicines, let your obstetrician know about the visit and how you are doing. Follow up with your health care provider as directed. °· After you have recovered from your asthma attack, make an appointment with your outpatient doctor to talk about ways to reduce the likelihood of future attacks. If you do not have a doctor who manages your asthma, make an appointment with a   primary care doctor to discuss your asthma. °SEEK IMMEDIATE MEDICAL CARE IF:  °· You are getting worse. °· You have trouble breathing. If severe, call your local emergency services (911 in the [place] °· You develop chest pain or discomfort. °· You are  vomiting. °· You are not able to keep fluids down. °· You are coughing up yellow, green, brown, or bloody sputum. °· You have a fever and your symptoms suddenly get worse. °· You have trouble swallowing. °MAKE SURE YOU:  °· Understand these instructions. °· Will watch your condition. °· Will get help right away if you are not doing well or get worse. °Document Released: 03/12/2007 Document Revised: 11/30/2013 Document Reviewed: 06/02/2013 °ExitCare® Patient Information ©2015 [employer]. This information is not intended to replace advice given to you by your health care provider. Make sure you discuss any questions you have with your health care provider. ° °

## 2015-04-18 NOTE — ED Provider Notes (Signed)
[facility] Emergency Department Provider Note    ____________________________________________  Time seen: 1711  I have reviewed the triage vital signs and the nursing notes.   HISTORY  Chief Complaint Shortness of Breath   History limited by: Not Limited   HPI Katrina Mitchell is a 34 y.o. female with a history of asthma who presents to the emergency department with worsening shortness of breath. She states the symptoms have been going on for roughly 3-4 days and have gradually continued to get worse. She does have some associated mild chest discomfort. The patient is unclear of what exactly prompted the symptoms. She has tried her home neb treatments with only mild relief. She has had similar symptoms in the pastand states this feels like her normal asthma exacerbation. Denies any fevers.   Past Medical History  Diagnosis Date  . Asthma   . Pregnancy as incidental finding   . Complication of anesthesia   . GERD (gastroesophageal reflux disease)     Patient Active Problem List   Diagnosis Date Noted  . Moderate persistent asthma, high risk. Status post intubation December 2013 01/14/2013  . Status asthmaticus 12/06/2012  . Sterilization 07/01/2012    Past Surgical History  Procedure Laterality Date  . Nasal sinus surgery    . Cholecystectomy  2001  . Lypoma removal  2003    right arm   . Laparoscopic tubal ligation  08/19/2012    Procedure: LAPAROSCOPIC TUBAL LIGATION;  Surgeon: Lazaro ArmsLuther H Eure, MD;  Location: [facility];  Service: Gynecology;  Laterality: Bilateral;    Current Outpatient Rx  Name  Route  Sig  Dispense  Refill  . acetaminophen (TYLENOL) 325 MG tablet      Per bottle         . albuterol (PROVENTIL HFA;VENTOLIN HFA) 108 (90 BASE) MCG/ACT inhaler   Inhalation   Inhale 1-2 puffs into the lungs every 6 (six) hours as needed for wheezing.   3 Inhaler   3   . EXPIRED: albuterol (PROVENTIL) (2.5 MG/3ML) 0.083% nebulizer solution    Nebulization   Take 3 mLs (2.5 mg total) by nebulization every 6 (six) hours as needed for wheezing.   75 mL   3   . esomeprazole (NEXIUM) 20 MG capsule   Oral   Take 20 mg by mouth daily at 12 noon.         . famotidine (PEPCID) 20 MG tablet   Oral   Take 1 tablet (20 mg total) by mouth at bedtime.         . fexofenadine (ALLEGRA) 60 MG tablet   Oral   Take 60 mg by mouth 2 (two) times daily.         . fluticasone (FLONASE) 50 MCG/ACT nasal spray   Each Nare   Place 2 sprays into both nostrils daily.   16 g   6   . ibuprofen (ADVIL,MOTRIN) 200 MG tablet      Per bottle         . ipratropium (ATROVENT) 0.02 % nebulizer solution   Nebulization   Take 2.5 mLs (0.5 mg total) by nebulization 4 (four) times daily. Patient not taking: Reported on 04/06/2015   30 mL   2   . ipratropium-albuterol (DUONEB) 0.5-2.5 (3) MG/3ML SOLN      USE ONE VIAL IN NEBULIZER TWICE DAILY AS DIRECTED Patient not taking: Reported on 04/06/2015   3 mL   11   . metroNIDAZOLE (FLAGYL) 500 MG tablet  Oral   Take 1 tablet (500 mg total) by mouth 2 (two) times daily.   14 tablet   0   . mometasone-formoterol (DULERA) 200-5 MCG/ACT AERO   Inhalation   Inhale 2 puffs into the lungs 2 (two) times daily.   3 Inhaler   3   . predniSONE (DELTASONE) 20 MG tablet   Oral   Take 2 tablets (40 mg total) by mouth daily.   8 tablet   0   . triamcinolone cream (KENALOG) 0.1 %   Topical   Apply 1 application topically 2 (two) times daily.   30 g   0     Allergies Review of patient's allergies indicates no known allergies.  No family history on file.  Social History History  Substance Use Topics  . Smoking status: Former Smoker -- 1.00 packs/day for 2 years    Types: Cigarettes    Quit date: 07/10/2011  . Smokeless tobacco: Never Used  . Alcohol Use: No     Comment: weekly    Review of Systems  Constitutional: Negative for fever. Cardiovascular: Mild chest  tightness Respiratory: Positive for shortness of breath. Gastrointestinal: Negative for abdominal pain, vomiting and diarrhea. Genitourinary: Negative for dysuria. Musculoskeletal: Negative for back pain. Skin: Negative for rash. Neurological: Negative for headaches, focal weakness or numbness.   10-point ROS otherwise negative.  ____________________________________________   PHYSICAL EXAM:  VITAL SIGNS: ED Triage Vitals  Enc Vitals Group     BP 04/18/15 1425 108/81 mmHg     Pulse Rate 04/18/15 1425 91     Resp 04/18/15 1425 20     Temp 04/18/15 1425 98.2 F (36.8 C)     Temp Source 04/18/15 1425 Oral     SpO2 04/18/15 1425 98 %     Weight 04/18/15 1425 200 lb (90.719 kg)     Height 04/18/15 1425 5\' 7"  (1.702 m)     Head Cir --      Peak Flow --      Pain Score 04/18/15 1426 3     Pain Loc --      Pain Edu? --      Excl. in GC? --      Constitutional: Alert and oriented. Well appearing and in no distress. Eyes: Conjunctivae are normal. PERRL. Normal extraocular movements. ENT   Head: Normocephalic and atraumatic.   Nose: No congestion/rhinnorhea.   Mouth/Throat: Mucous membranes are moist.   Neck: No stridor. Hematological/Lymphatic/Immunilogical: No cervical lymphadenopathy. Cardiovascular: Normal rate, regular rhythm.  No murmurs, rubs, or gallops. Respiratory: Normal respiratory effort without tachypnea nor retractions. Diffuse and bilateral expiratory wheezing. Gastrointestinal: Soft and nontender. No distention.  Genitourinary: Deferred Musculoskeletal: Normal range of motion in all extremities. No joint effusions.  No lower extremity tenderness nor edema. Neurologic:  Normal speech and language. No gross focal neurologic deficits are appreciated. Speech is normal.  Skin:  Skin is warm, dry and intact. No rash noted. Psychiatric: Mood and affect are normal. Speech and behavior are normal. Patient exhibits appropriate insight and  judgment.  ____________________________________________    LABS (pertinent positives/negatives)  Labs Reviewed  CBC - Abnormal; Notable for the following:    MCV 78.5 (*)    RDW 15.0 (*)    All other components within normal limits  BASIC METABOLIC PANEL     ____________________________________________   EKG  None  ____________________________________________    RADIOLOGY  Chest x-ray IMPRESSION: Chronic hyperinflation and peribronchial thickening. No acute cardiopulmonary abnormality.  ____________________________________________  PROCEDURES  Procedure(s) performed: None  Critical Care performed: No  ____________________________________________   INITIAL IMPRESSION / ASSESSMENT AND PLAN / ED COURSE  Pertinent labs & imaging results that were available during my care of the patient were reviewed by me and considered in my medical decision making (see chart for details).  Patient here with acute asthma exacerbation. Will give duonebs and steroids.  ----------------------------------------- 6:56 PM on 04/18/2015 -----------------------------------------  She states she feels better. Feels like she can manage her asthma exacerbation at home at this point. Will discharge home with course of steroids. ____________________________________________   FINAL CLINICAL IMPRESSION(S) / ED DIAGNOSES  Final diagnoses:  Asthma exacerbation     Phineas Semen, MD 04/18/15 1914

## 2015-04-18 NOTE — ED Notes (Signed)
Pt ambulatory to triage with reports of shortness of breath that began over the weekend, pt has asthma and nebs/inhalers have not been helping.

## 2015-04-21 ENCOUNTER — Ambulatory Visit: Payer: Self-pay | Attending: Internal Medicine | Admitting: Internal Medicine

## 2015-04-21 ENCOUNTER — Encounter: Payer: Self-pay | Admitting: Internal Medicine

## 2015-04-21 VITALS — BP 107/72 | HR 92 | Temp 98.1°F | Ht 67.0 in | Wt 198.0 lb

## 2015-04-21 DIAGNOSIS — R888 Abnormal findings in other body fluids and substances: Secondary | ICD-10-CM

## 2015-04-21 DIAGNOSIS — J4541 Moderate persistent asthma with (acute) exacerbation: Secondary | ICD-10-CM

## 2015-04-21 DIAGNOSIS — IMO0002 Reserved for concepts with insufficient information to code with codable children: Secondary | ICD-10-CM

## 2015-04-21 DIAGNOSIS — B977 Papillomavirus as the cause of diseases classified elsewhere: Secondary | ICD-10-CM

## 2015-04-21 MED ORDER — PREDNISONE 10 MG PO TABS: ORAL_TABLET | ORAL | Status: DC

## 2015-04-21 NOTE — Patient Instructions (Signed)
HPV Test The HPV (human papillomavirus) test is used to screen for high-risk types with HPV infection. HPV is a group of about 100 related viruses, of which 40 types are genital viruses. Most HPV viruses cause infections that usually resolve without treatment within 2 years. Some HPV infections can cause skin and genital warts (condylomata). HPV types 16, 18, 31 and 45 are considered high-risk types of HPV. High-risk types of HPV do not usually cause visible warts, but if untreated, may lead to cancers of the outlet of the womb (cervix) or anus. An HPV test identifies the DNA (genetic) strands of the HPV infection. Because the test identifies the DNA strands, the test is also referred to as the HPV DNA test. Although HPV is found in both males and females, the HPV test is only used to screen for cervical cancer in females. This test is recommended for females:  With an abnormal Pap test.  After treatment of an abnormal Pap test.  Aged 30 and older.  After treatment of a high-risk HPV infection. The HPV test may be done at the same time as a Pap test in females over the age of 30. Both the HPV and Pap test require a sample of cells from the cervix. PREPARATION FOR TEST  You may be asked to avoid douching, tampons, or vaginal medicines for 48 hours before the HPV test. You will be asked to urinate before the test. For the HPV test, you will need to lie on an exam table with your feet in stirrups. A spatula will be inserted into the vagina. The spatula will be used to swab the cervix for a cell and mucus sample. The sample will be further evaluated in a lab under a microscope. NORMAL FINDINGS  Normal: High-risk HPV is not found.  Ranges for normal findings may vary among different laboratories and hospitals. You should always check with your doctor after having lab work or other tests done to discuss the meaning of your test results and whether your values are considered within normal limits. MEANING  OF TEST An abnormal HPV test means that high-risk HPV is found. Your caregiver may recommend further testing. Your caregiver will go over the test results with you. He or she will and discuss the importance and meaning of your results, as well as treatment options and the need for additional tests, if necessary. OBTAINING THE RESULTS  It is your responsibility to obtain your test results. Ask the lab or department performing the test when and how you will get your results. Document Released: 12/20/2004 Document Revised: 02/17/2012 Document Reviewed: 09/04/2005 ExitCare Patient Information 2015 [employer]. This information is not intended to replace advice given to you by your health care provider. Make sure you discuss any questions you have with your health care provider.  

## 2015-04-21 NOTE — Progress Notes (Signed)
Patient here to follow up on her asthma.  She was recently seen in the Ed with an asthma exacerbation where she was given a round of steroids and sent home.   She states she feels some better she still has today and tomorrow left of her 40  Mg prednisone tablets.

## 2015-04-22 ENCOUNTER — Encounter: Payer: Self-pay | Admitting: Internal Medicine

## 2015-04-22 NOTE — Progress Notes (Signed)
Patient ID: [name], female   DOB: 09/07/1981, 34 y.o.   MRN: [id number]  CC: asthma exacerbation   HPI: Katrina Mitchell is a 34 y.o. female here today for a follow up visit.  Patient has past medical history of asthma and GERD.  Patient was seen in jthe ER on 5/10 for acute asthma exacerbation. She reports that she ran out of her medication for 3 days which caused her to suffer from a exacerbation. She was given a short course of prednisone but states that she still does not feel much better. She continues to have voice changes, cough, and wheezing.  Patient is interested in getting her lab results from last visit    Patient has No headache, No chest pain, No abdominal pain - No Nausea, No new weakness tingling or numbness.  No Known Allergies Past Medical History  Diagnosis Date  . Asthma   . Pregnancy as incidental finding   . Complication of anesthesia   . GERD (gastroesophageal reflux disease)    Current Outpatient Prescriptions on File Prior to Visit  Medication Sig Dispense Refill  . acetaminophen (TYLENOL) 325 MG tablet Per bottle    . albuterol (PROVENTIL HFA;VENTOLIN HFA) 108 (90 BASE) MCG/ACT inhaler Inhale 1-2 puffs into the lungs every 6 (six) hours as needed for wheezing. 3 Inhaler 3  . esomeprazole (NEXIUM) 20 MG capsule Take 20 mg by mouth daily at 12 noon.    . famotidine (PEPCID) 20 MG tablet Take 1 tablet (20 mg total) by mouth at bedtime.    . fexofenadine (ALLEGRA) 60 MG tablet Take 60 mg by mouth 2 (two) times daily.    . fluticasone (FLONASE) 50 MCG/ACT nasal spray Place 2 sprays into both nostrils daily. 16 g 6  . ibuprofen (ADVIL,MOTRIN) 200 MG tablet Per bottle    . mometasone-formoterol (DULERA) 200-5 MCG/ACT AERO Inhale 2 puffs into the lungs 2 (two) times daily. 3 Inhaler 3  . predniSONE (DELTASONE) 20 MG tablet Take 2 tablets (40 mg total) by mouth daily. 8 tablet 0  . triamcinolone cream (KENALOG) 0.1 % Apply 1 application topically 2 (two) times daily. 30  g 0  . albuterol (PROVENTIL) (2.5 MG/3ML) 0.083% nebulizer solution Take 3 mLs (2.5 mg total) by nebulization every 6 (six) hours as needed for wheezing. 75 mL 3  . ipratropium (ATROVENT) 0.02 % nebulizer solution Take 2.5 mLs (0.5 mg total) by nebulization 4 (four) times daily. (Patient not taking: Reported on 04/06/2015) 30 mL 2  . ipratropium-albuterol (DUONEB) 0.5-2.5 (3) MG/3ML SOLN USE ONE VIAL IN NEBULIZER TWICE DAILY AS DIRECTED (Patient not taking: Reported on 04/06/2015) 3 mL 11  . metroNIDAZOLE (FLAGYL) 500 MG tablet Take 1 tablet (500 mg total) by mouth 2 (two) times daily. (Patient not taking: Reported on 04/21/2015) 14 tablet 0   No current facility-administered medications on file prior to visit.   No family history on file. History   Social History  . Marital Status: Single    Spouse Name: N/A  . Number of Children: N/A  . Years of Education: N/A   Occupational History  . Not on file.   Social History Main Topics  . Smoking status: Former Smoker -- 1.00 packs/day for 2 years    Types: Cigarettes    Quit date: 07/10/2011  . Smokeless tobacco: Never Used  . Alcohol Use: No     Comment: weekly  . Drug Use: No     Comment: reports smokes "here and there"  .  Sexual Activity: Yes    Birth Control/ Protection: Other-see comments     Comment: 6 weeks postpartum   Other Topics Concern  . Not on file   Social History Narrative    Review of Systems: See HPI   Objective:   Filed Vitals:   04/21/15 1440  BP: 107/72  Pulse: 92  Temp: 98.1 F (36.7 C)    Physical Exam  Constitutional: No distress.  Cardiovascular: Normal rate, regular rhythm and normal heart sounds.   Pulmonary/Chest: Effort normal. No respiratory distress. She has wheezes.  Scatted wheezing in right upper lung fields  Skin: Skin is warm and dry. She is not diaphoretic.     Lab Results  Component Value Date   WBC 10.3 04/18/2015   HGB 13.1 04/18/2015   HCT 38.9 04/18/2015   MCV 78.5*  04/18/2015   PLT 295 04/18/2015   Lab Results  Component Value Date   CREATININE 0.61 04/18/2015   BUN 9 04/18/2015   NA 136 04/18/2015   K 4.2 04/18/2015   CL 105 04/18/2015   CO2 25 04/18/2015    Lab Results  Component Value Date   HGBA1C 5.7* 04/06/2015   Lipid Panel     Component Value Date/Time   CHOL 231* 04/06/2015 0914   TRIG 161* 04/06/2015 0914   HDL 44* 04/06/2015 0914   CHOLHDL 5.3 04/06/2015 0914   VLDL 32 04/06/2015 0914   LDLCALC 155* 04/06/2015 0914       Assessment and plan:   Katrina Mitchell was seen today for asthma.  Diagnoses and all orders for this visit:  Moderate persistent asthma, with acute exacerbation Orders: -     predniSONE (DELTASONE) 10 MG tablet; Take 20 mg for 3 days, then 10 mg for 3 days Will give patient a slightly extended course of prednisone and stressed the need to get medication refills before she runs out. Patient will need orange card for pulmonology referral. If symptoms persist I will add Singulair to regimen.  HPV test positive Went over cytology results, patient has been scheduled with Family Practice for a colposcopy.  Condom use addressed.  Return if symptoms worsen or fail to improve.    Holland CommonsKECK, Katrina Geffert, NP-C [facility] [phone] 04/22/2015, 11:17 AM

## 2015-05-11 ENCOUNTER — Encounter: Payer: Self-pay | Admitting: Family Medicine

## 2015-05-11 ENCOUNTER — Ambulatory Visit (INDEPENDENT_AMBULATORY_CARE_PROVIDER_SITE_OTHER): Payer: Self-pay | Admitting: Family Medicine

## 2015-05-11 VITALS — BP 123/67 | HR 84 | Temp 98.3°F | Ht 67.0 in

## 2015-05-11 DIAGNOSIS — R87618 Other abnormal cytological findings on specimens from cervix uteri: Secondary | ICD-10-CM

## 2015-05-11 DIAGNOSIS — R87810 Cervical high risk human papillomavirus (HPV) DNA test positive: Secondary | ICD-10-CM

## 2015-05-11 LAB — POCT URINE PREGNANCY: Preg Test, Ur: NEGATIVE

## 2015-05-11 NOTE — Patient Instructions (Signed)
Colposcopy Care After Colposcopy is a procedure in which a special tool is used to magnify the surface of the cervix. A tissue sample (biopsy) may also be taken. This sample will be looked at for cervical cancer or other problems. After the test:  You may have some cramping.  Lie down for a few minutes if you feel lightheaded.   You may have some bleeding which should stop in a few days. HOME CARE  Do not have sex or use tampons for 2 to 3 days or as told.  Only take medicine as told by your doctor.  Continue to take your birth control pills as usual. Finding out the results of your test Ask when your test results will be ready. Make sure you get your test results. GET HELP RIGHT AWAY IF:  You are bleeding a lot or are passing blood clots.  You develop a fever of 102 F (38.9 C) or higher.  You have abnormal vaginal discharge.  You have cramps that do not go away with medicine.  You feel lightheaded, dizzy, or pass out (faint). MAKE SURE YOU:   Understand these instructions.  Will watch your condition.  Will get help right away if you are not doing well or get worse. Document Released: 05/13/2008 Document Revised: 02/17/2012 Document Reviewed: 06/24/2013 ExitCare Patient Information 2015 [employer]. This information is not intended to replace advice given to you by your health care provider. Make sure you discuss any questions you have with your health care provider.  

## 2015-05-11 NOTE — Progress Notes (Signed)
Patient ID: Katrina Mitchell, female   DOB: 09/23/1981, 34 y.o.   MRN: [id number] Patient ID: Katrina Mitchell, female   DOB: 02/10/1981, 34 y.o.   MRN: [id number]  Chief Complaint  Patient presents with  . Colposcopy    HPI Katrina Mitchell is a 34 y.o. female.  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  HPI  Indications: Pap smear on April 2016 showed: +HPV with normal cytology. Previous colposcopy: None. Prior cervical treatment: no treatment.  Past Medical History  Diagnosis Date  . Asthma   . Pregnancy as incidental finding   . Complication of anesthesia   . GERD (gastroesophageal reflux disease)     Past Surgical History  Procedure Laterality Date  . Nasal sinus surgery    . Cholecystectomy  2001  . Lypoma removal  2003    right arm   . Laparoscopic tubal ligation  08/19/2012    Procedure: LAPAROSCOPIC TUBAL LIGATION;  Surgeon: Lazaro ArmsLuther H Eure, MD;  Location: [facility];  Service: Gynecology;  Laterality: Bilateral;    No family history on file.  Social History History  Substance Use Topics  . Smoking status: Former Smoker -- 1.00 packs/day for 2 years    Types: Cigarettes    Quit date: 07/10/2011  . Smokeless tobacco: Never Used  . Alcohol Use: No     Comment: weekly    No Known Allergies  Current Outpatient Prescriptions  Medication Sig Dispense Refill  . acetaminophen (TYLENOL) 325 MG tablet Per bottle    . albuterol (PROVENTIL HFA;VENTOLIN HFA) 108 (90 BASE) MCG/ACT inhaler Inhale 1-2 puffs into the lungs every 6 (six) hours as needed for wheezing. 3 Inhaler 3  . albuterol (PROVENTIL) (2.5 MG/3ML) 0.083% nebulizer solution Take 3 mLs (2.5 mg total) by nebulization every 6 (six) hours as needed for wheezing. 75 mL 3  . esomeprazole (NEXIUM) 20 MG capsule Take 20 mg by mouth daily at 12 noon.    . famotidine (PEPCID) 20 MG tablet Take 1 tablet (20 mg total) by mouth at bedtime.    . fexofenadine (ALLEGRA) 60 MG tablet Take 60 mg by mouth 2 (two) times daily.    .  fluticasone (FLONASE) 50 MCG/ACT nasal spray Place 2 sprays into both nostrils daily. 16 g 6  . ibuprofen (ADVIL,MOTRIN) 200 MG tablet Per bottle    . ipratropium (ATROVENT) 0.02 % nebulizer solution Take 2.5 mLs (0.5 mg total) by nebulization 4 (four) times daily. (Patient not taking: Reported on 04/06/2015) 30 mL 2  . ipratropium-albuterol (DUONEB) 0.5-2.5 (3) MG/3ML SOLN USE ONE VIAL IN NEBULIZER TWICE DAILY AS DIRECTED (Patient not taking: Reported on 04/06/2015) 3 mL 11  . metroNIDAZOLE (FLAGYL) 500 MG tablet Take 1 tablet (500 mg total) by mouth 2 (two) times daily. (Patient not taking: Reported on 04/21/2015) 14 tablet 0  . mometasone-formoterol (DULERA) 200-5 MCG/ACT AERO Inhale 2 puffs into the lungs 2 (two) times daily. 3 Inhaler 3  . predniSONE (DELTASONE) 10 MG tablet Take 20 mg for 3 days, then 10 mg for 3 days 9 tablet 0  . predniSONE (DELTASONE) 20 MG tablet Take 2 tablets (40 mg total) by mouth daily. 8 tablet 0  . triamcinolone cream (KENALOG) 0.1 % Apply 1 application topically 2 (two) times daily. 30 g 0   No current facility-administered medications for this visit.    Review of Systems Review of Systems  Respiratory: Negative.   Cardiovascular: Negative.   Gastrointestinal: Negative.   Genitourinary: Negative.  Last menstrual period 04/11/2015.  Physical Exam Physical Exam  Constitutional: She appears well-developed. No distress.  Pulmonary/Chest: Effort normal.  Genitourinary: Vagina normal. There is no rash or tenderness on the right labia. There is no rash or tenderness on the left labia. Cervix exhibits friability.    Nursing note and vitals reviewed.   Data Reviewed PAP result  Assessment    Procedure Details  The risks and benefits of the procedure and Written informed consent obtained.  Speculum placed in vagina and excellent visualization of cervix achieved, cervix swabbed x 3 with acetic acid solution.  Specimens: No biopsy or ECC  indicated.  Complications: none.     Plan    Repeat co-testing in 1 year.       Janit Pagan 05/11/2015, 9:58 AM

## 2015-08-03 ENCOUNTER — Other Ambulatory Visit: Payer: Self-pay

## 2015-08-03 MED ORDER — ALBUTEROL SULFATE HFA 108 (90 BASE) MCG/ACT IN AERS: 1.0000 | INHALATION_SPRAY | Freq: Four times a day (QID) | RESPIRATORY_TRACT | Status: DC | PRN

## 2015-08-27 ENCOUNTER — Emergency Department
Admission: EM | Admit: 2015-08-27 | Discharge: 2015-08-27 | Disposition: A | Discharge status: Home / Self Care | Payer: Self-pay | Attending: Emergency Medicine | Admitting: Emergency Medicine

## 2015-08-27 DIAGNOSIS — Z792 Long term (current) use of antibiotics: Secondary | ICD-10-CM | POA: Insufficient documentation

## 2015-08-27 DIAGNOSIS — Z3202 Encounter for pregnancy test, result negative: Secondary | ICD-10-CM | POA: Insufficient documentation

## 2015-08-27 DIAGNOSIS — K029 Dental caries, unspecified: Secondary | ICD-10-CM | POA: Insufficient documentation

## 2015-08-27 DIAGNOSIS — Z7951 Long term (current) use of inhaled steroids: Secondary | ICD-10-CM | POA: Insufficient documentation

## 2015-08-27 DIAGNOSIS — Z87891 Personal history of nicotine dependence: Secondary | ICD-10-CM | POA: Insufficient documentation

## 2015-08-27 DIAGNOSIS — R55 Syncope and collapse: Secondary | ICD-10-CM | POA: Insufficient documentation

## 2015-08-27 DIAGNOSIS — Z79899 Other long term (current) drug therapy: Secondary | ICD-10-CM | POA: Insufficient documentation

## 2015-08-27 LAB — URINALYSIS COMPLETE WITH MICROSCOPIC (ARMC ONLY)
BILIRUBIN URINE: NEGATIVE
GLUCOSE, UA: NEGATIVE mg/dL
Hgb urine dipstick: NEGATIVE
KETONES UR: NEGATIVE mg/dL
NITRITE: NEGATIVE
Protein, ur: 30 mg/dL — AB
Specific Gravity, Urine: 1.033 — ABNORMAL HIGH (ref 1.005–1.030)
pH: 5 (ref 5.0–8.0)

## 2015-08-27 LAB — BASIC METABOLIC PANEL
Anion gap: 5 (ref 5–15)
BUN: 14 mg/dL (ref 6–20)
CO2: 26 mmol/L (ref 22–32)
CREATININE: 0.68 mg/dL (ref 0.44–1.00)
Calcium: 8.5 mg/dL — ABNORMAL LOW (ref 8.9–10.3)
Chloride: 107 mmol/L (ref 101–111)
GFR calc Af Amer: >60 mL/min (ref >60)
GFR calc non Af Amer: >60 mL/min (ref >60)
Glucose, Bld: 111 mg/dL — ABNORMAL HIGH (ref 65–99)
Potassium: 3.7 mmol/L (ref 3.5–5.1)
SODIUM: 138 mmol/L (ref 135–145)

## 2015-08-27 LAB — CBC
HCT: 35.6 % (ref 35.0–47.0)
HEMOGLOBIN: 11.8 g/dL — AB (ref 12.0–16.0)
MCH: 26.6 pg (ref 26.0–34.0)
MCHC: 33.2 g/dL (ref 32.0–36.0)
MCV: 80.2 fL (ref 80.0–100.0)
PLATELETS: 277 10*3/uL (ref 150–440)
RBC: 4.45 MIL/uL (ref 3.80–5.20)
RDW: 14.6 % — ABNORMAL HIGH (ref 11.5–14.5)
WBC: 12.4 10*3/uL — AB (ref 3.6–11.0)

## 2015-08-27 LAB — PREGNANCY, URINE: PREG TEST UR: NEGATIVE

## 2015-08-27 LAB — GLUCOSE, CAPILLARY: Glucose-Capillary: 93 mg/dL (ref 65–99)

## 2015-08-27 MED ORDER — CLINDAMYCIN HCL 300 MG PO CAPS: 300.0000 mg | ORAL_CAPSULE | Freq: Three times a day (TID) | ORAL | Status: DC

## 2015-08-27 MED ORDER — CLINDAMYCIN HCL 150 MG PO CAPS
300.0000 mg | ORAL_CAPSULE | Freq: Once | ORAL | Status: AC
  Administered 2015-08-27: 300 mg via ORAL
  Filled 2015-08-27: qty 2

## 2015-08-27 MED ORDER — ACETAMINOPHEN 500 MG PO TABS
1000.0000 mg | ORAL_TABLET | ORAL | Status: AC
  Administered 2015-08-27: 1000 mg via ORAL
  Filled 2015-08-27: qty 2

## 2015-08-27 MED ORDER — IBUPROFEN 800 MG PO TABS
800.0000 mg | ORAL_TABLET | ORAL | Status: AC
  Administered 2015-08-27: 800 mg via ORAL
  Filled 2015-08-27: qty 1

## 2015-08-27 NOTE — ED Provider Notes (Signed)
[facility] Emergency Department Provider Note REMINDER - THIS NOTE IS NOT A FINAL MEDICAL RECORD UNTIL IT IS SIGNED. UNTIL THEN, THE CONTENT BELOW MAY REFLECT INFORMATION FROM A DOCUMENTATION TEMPLATE, NOT THE ACTUAL PATIENT VISIT. ____________________________________________  Time seen: Approximately 5:57 AM  I have reviewed the triage vital signs and the nursing notes.   HISTORY  Chief Complaint Loss of Consciousness and Dental Pain    HPI Katrina Mitchell is a 34 y.o. female who passed out today. Patient was sitting on the deck with her friend at approximately 2:30 AM, she reports she did having pain in the tooth with severe cavities for about the last 3-4 days. She reports that the tooth pain became severe and while sitting in a chair talking to her friend she started to feel nauseated and lightheaded. She then stood up, reported to her friend that she felt as though she was going to need to lay down and then her friend witnessing the event tells me that the patient eyes crossed over, the patient fell backward striking the back of her head on a deck rail and then was unconscious. She rushed to call 911, and by the time she returned to the deck about 1 minute later the patient was standing and talking to her normally.   Patient reports she is in no significant pain at this time except for moderate pain over the right upper mouth where her tooth is hurting. No fevers or chills. No chest pain or trouble breathing. She is not pregnant. She did feel briefly nauseated prior to passing out. She has passed out once before briefly while she had the flu. She denies any numbness or tingling. She does not have a headache or any neck pain.   Past Medical History  Diagnosis Date  . Asthma   . Pregnancy as incidental finding   . Complication of anesthesia   . GERD (gastroesophageal reflux disease)     Patient Active Problem List   Diagnosis Date Noted  . Moderate persistent  asthma, high risk. Status post intubation December 2013 01/14/2013  . Status asthmaticus 12/06/2012  . Sterilization 07/01/2012   no family history of sudden onset cardiac arrest.  Past Surgical History  Procedure Laterality Date  . Nasal sinus surgery    . Cholecystectomy  2001  . Lypoma removal  2003    right arm   . Laparoscopic tubal ligation  08/19/2012    Procedure: LAPAROSCOPIC TUBAL LIGATION;  Surgeon: Lazaro Arms, MD;  Location: [facility];  Service: Gynecology;  Laterality: Bilateral;    Current Outpatient Rx  Name  Route  Sig  Dispense  Refill  . acetaminophen (TYLENOL) 325 MG tablet      Per bottle         . albuterol (PROVENTIL HFA;VENTOLIN HFA) 108 (90 BASE) MCG/ACT inhaler   Inhalation   Inhale 1-2 puffs into the lungs every 6 (six) hours as needed for wheezing.   3 Inhaler   3   . EXPIRED: albuterol (PROVENTIL) (2.5 MG/3ML) 0.083% nebulizer solution   Nebulization   Take 3 mLs (2.5 mg total) by nebulization every 6 (six) hours as needed for wheezing.   75 mL   3   . esomeprazole (NEXIUM) 20 MG capsule   Oral   Take 20 mg by mouth daily at 12 noon.         . famotidine (PEPCID) 20 MG tablet   Oral   Take 1 tablet (20 mg total)  by mouth at bedtime.         . fexofenadine (ALLEGRA) 60 MG tablet   Oral   Take 60 mg by mouth 2 (two) times daily.         . fluticasone (FLONASE) 50 MCG/ACT nasal spray   Each Nare   Place 2 sprays into both nostrils daily.   16 g   6   . ibuprofen (ADVIL,MOTRIN) 200 MG tablet      Per bottle         . ipratropium (ATROVENT) 0.02 % nebulizer solution   Nebulization   Take 2.5 mLs (0.5 mg total) by nebulization 4 (four) times daily. Patient not taking: Reported on 04/06/2015   30 mL   2   . ipratropium-albuterol (DUONEB) 0.5-2.5 (3) MG/3ML SOLN      USE ONE VIAL IN NEBULIZER TWICE DAILY AS DIRECTED Patient not taking: Reported on 04/06/2015   3 mL   11   . metroNIDAZOLE (FLAGYL) 500 MG tablet   Oral    Take 1 tablet (500 mg total) by mouth 2 (two) times daily. Patient not taking: Reported on 04/21/2015   14 tablet   0   . mometasone-formoterol (DULERA) 200-5 MCG/ACT AERO   Inhalation   Inhale 2 puffs into the lungs 2 (two) times daily.   3 Inhaler   3   . predniSONE (DELTASONE) 10 MG tablet      Take 20 mg for 3 days, then 10 mg for 3 days   9 tablet   0   . predniSONE (DELTASONE) 20 MG tablet   Oral   Take 2 tablets (40 mg total) by mouth daily.   8 tablet   0   . triamcinolone cream (KENALOG) 0.1 %   Topical   Apply 1 application topically 2 (two) times daily.   30 g   0     Allergies Review of patient's allergies indicates no known allergies.  No family history on file.  Social History Social History  Substance Use Topics  . Smoking status: Former Smoker -- 1.00 packs/day for 2 years    Types: Cigarettes    Quit date: 07/10/2011  . Smokeless tobacco: Never Used  . Alcohol Use: No     Comment: weekly    Review of Systems Constitutional: No fever/chills Eyes: No visual changes. ENT: No sore throat. She does report 3-4 days of a very sore right upper tooth. She reports that this tooth is caused from's off-and-on for years and it needs to be removed because of his severe cavity. Cardiovascular: Denies chest pain. Respiratory: Denies shortness of breath. Gastrointestinal: No abdominal pain.  no vomiting.  No diarrhea.  No constipation. Genitourinary: Negative for dysuria. Musculoskeletal: Negative for back pain. Skin: Negative for rash. Neurological: Negative for headaches, focal weakness or numbness.  10-point ROS otherwise negative.  ____________________________________________   PHYSICAL EXAM:  VITAL SIGNS: ED Triage Vitals  Enc Vitals Group     BP 08/27/15 0507 124/70 mmHg     Pulse Rate 08/27/15 0507 79     Resp 08/27/15 0507 18     Temp 08/27/15 0507 98.2 F (36.8 C)     Temp Source 08/27/15 0507 Oral     SpO2 08/27/15 0507 99 %      Weight 08/27/15 0507 185 lb (83.915 kg)     Height 08/27/15 0507  (1.676 m)     Head Cir --      Peak Flow --  Pain Score 08/27/15 0508 5     Pain Loc --      Pain Edu? --      Excl. in GC? --    Constitutional: Alert and oriented. Well appearing and in no acute distress. She is quite amicable with her son on bed. Eyes: Conjunctivae are normal. PERRL. EOMI. Head: Atraumatic. No tenderness over the skull. No swelling or edema. No nasal bleeding, no raccoon eyes. Nose: No congestion/rhinnorhea. Mouth/Throat: Mucous membranes are moist.  Oropharynx non-erythematous. The patient does have a notably deep cavity in approximately tooth #5. Tooth #5 is tender, but there is no evidence of fistula or edema. There is an obvious cavity. This tooth is focally tender without swelling or erythema of the face. No trismus. The neck is normal. No cervical spine tenderness. Normal range of motion of neck without pain. Neck: No stridor.   Cardiovascular: Normal rate, regular rhythm. Grossly normal heart sounds.  Good peripheral circulation. Respiratory: Normal respiratory effort.  No retractions. Lungs CTAB. Gastrointestinal: Soft and nontender. No distention. No abdominal bruits. No CVA tenderness. Musculoskeletal: No lower extremity tenderness nor edema.  No joint effusions. Neurologic:  Normal speech and language. No gross focal neurologic deficits are appreciated.Skin:  Skin is warm, dry and intact. No rash noted. Psychiatric: Mood and affect are normal. Speech and behavior are normal. 5 out of 5 in all extremity's.  ____________________________________________   LABS (all labs ordered are listed, but only abnormal results are displayed)  Labs Reviewed  BASIC METABOLIC PANEL - Abnormal; Notable for the following:    Glucose, Bld 111 (*)    Calcium 8.5 (*)    All other components within normal limits  CBC - Abnormal; Notable for the following:    WBC 12.4 (*)    Hemoglobin 11.8 (*)    RDW  14.6 (*)    All other components within normal limits  URINALYSIS COMPLETEWITH MICROSCOPIC ([facility] ONLY) - Abnormal; Notable for the following:    Color, Urine YELLOW (*)    APPearance CLEAR (*)    Specific Gravity, Urine 1.033 (*)    Protein, ur 30 (*)    Leukocytes, UA TRACE (*)    Bacteria, UA RARE (*)    Squamous Epithelial / LPF 0-5 (*)    All other components within normal limits  GLUCOSE, CAPILLARY  PREGNANCY, URINE  CBG MONITORING, ED   ____________________________________________  EKG  ED ECG REPORT I, QUALE, MARK, the attending physician, personally viewed and interpreted this ECG.  Date: 08/27/2015 EKG Time: 510 Rate: 80 Rhythm: normal sinus rhythm QRS Axis: normal Intervals: normal ST/T Wave abnormalities: normal Conduction Disutrbances: none Narrative Interpretation: unremarkable. Notably there is no Brugada, there is no delta wave, there is no prolonged QT.  ____________________________________________  RADIOLOGY  Patient negative by [place] head CT rule. ____________________________________________   PROCEDURES  Procedure(s) performed: None  Critical Care performed: No  ____________________________________________   INITIAL IMPRESSION / ASSESSMENT AND PLAN / ED COURSE  Pertinent labs & imaging results that were available during my care of the patient were reviewed by me and considered in my medical decision making (see chart for details).  Patient presents with a syncopal episode. Given the clinical history it appears likely vasovagal in nature and likely secondary to pain from her dental cavity. Her EKG is normal. Her blood count is normal along with electrolytes. Her mental status is normal there is no evidence of seizure, cardiac arrhythmia, no risk factor for pulmonary embolism, and her exam is very reassuring  at this time. I discussed with the patient no driving and she is agreeable, I feel that we she'll treat her dental cavity with  clindamycin and close follow-up with dental this week. There is no evidence of concerning etiology for her syncope at this time. She is fully recovered. I plan to hydrate her orally and discharge her to home. Her friend will be driving her. Return precautions and follow-up care discussed with the patient and her friend both of whom are agreeable. ____________________________________________   FINAL CLINICAL IMPRESSION(S) / ED DIAGNOSES  Final diagnoses:  Dental cavity  Vasovagal syncope      Sharyn Creamer, MD 08/27/15 [phone]

## 2015-08-27 NOTE — Discharge Instructions (Signed)
You have been seen today in the Emergency Department (ED)  for syncope (passing out).  Your workup including labs and EKG show reassuring results.  Your symptoms may be due to dehydration, so it is important that you drink plenty of non-alcoholic fluids.  Please call your regular doctor as soon as possible to schedule the next available clinic appointment to follow up with him/her regarding your visit to the ED and your symptoms.  Return to the Emergency Department (ED)  if you have any further syncopal episodes (pass out again) or develop ANY chest pain, pressure, tightness, trouble breathing, sudden sweating, or other symptoms that concern you.    You have been seen today in the Emergency Department (ED)  for syncope (passing out).  Your workup including labs and EKG show reassuring results.  Your symptoms may be due to dehydration, so it is important that you drink plenty of non-alcoholic fluids.  Please call your regular doctor as soon as possible to schedule the next available clinic appointment to follow up with him/her regarding your visit to the ED and your symptoms.  Return to the Emergency Department (ED)  if you have any further syncopal episodes (pass out again) or develop ANY chest pain, pressure, tightness, trouble breathing, sudden sweating, or other symptoms that concern you.    Dental Caries Dental caries (also called tooth decay) is the most common oral disease. It can occur at any age but is more common in children and young adults.  HOW DENTAL CARIES DEVELOPS  The process of decay begins when bacteria and foods (particularly sugars and starches) combine in your mouth to produce plaque. Plaque is a substance that sticks to the hard, outer surface of a tooth (enamel). The bacteria in plaque produce acids that attack enamel. These acids may also attack the root surface of a tooth (cementum) if it is exposed. Repeated attacks dissolve these surfaces and create holes in the tooth  (cavities). If left untreated, the acids destroy the other layers of the tooth.  RISK FACTORS  Frequent sipping of sugary beverages.   Frequent snacking on sugary and starchy foods, especially those that easily get stuck in the teeth.   Poor oral hygiene.   Dry mouth.   Substance abuse such as methamphetamine abuse.   Broken or poor-fitting dental restorations.   Eating disorders.   Gastroesophageal reflux disease (GERD).   Certain radiation treatments to the head and neck. SYMPTOMS In the early stages of dental caries, symptoms are seldom present. Sometimes white, chalky areas may be seen on the enamel or other tooth layers. In later stages, symptoms may include:  Pits and holes on the enamel.  Toothache after sweet, hot, or cold foods or drinks are consumed.  Pain around the tooth.  Swelling around the tooth. DIAGNOSIS  Most of the time, dental caries is detected during a regular dental checkup. A diagnosis is made after a thorough medical and dental history is taken and the surfaces of your teeth are checked for signs of dental caries. Sometimes special instruments, such as lasers, are used to check for dental caries. Dental X-ray exams may be taken so that areas not visible to the eye (such as between the contact areas of the teeth) can be checked for cavities.  TREATMENT  If dental caries is in its early stages, it may be reversed with a fluoride treatment or an application of a remineralizing agent at the dental office. Thorough brushing and flossing at home is needed to aid  these treatments. If it is in its later stages, treatment depends on the location and extent of tooth destruction:   If a small area of the tooth has been destroyed, the destroyed area will be removed and cavities will be filled with a material such as gold, silver amalgam, or composite resin.   If a large area of the tooth has been destroyed, the destroyed area will be removed and a cap  (crown) will be fitted over the remaining tooth structure.   If the center part of the tooth (pulp) is affected, a procedure called a root canal will be needed before a filling or crown can be placed.   If most of the tooth has been destroyed, the tooth may need to be pulled (extracted). HOME CARE INSTRUCTIONS You can prevent, stop, or reverse dental caries at home by practicing good oral hygiene. Good oral hygiene includes:  Thoroughly cleaning your teeth at least twice a day with a toothbrush and dental floss.   Using a fluoride toothpaste. A fluoride mouth rinse may also be used if recommended by your dentist or health care provider.   Restricting the amount of sugary and starchy foods and sugary liquids you consume.   Avoiding frequent snacking on these foods and sipping of these liquids.   Keeping regular visits with a dentist for checkups and cleanings. PREVENTION   Practice good oral hygiene.  Consider a dental sealant. A dental sealant is a coating material that is applied by your dentist to the pits and grooves of teeth. The sealant prevents food from being trapped in them. It may protect the teeth for several years.  Ask about fluoride supplements if you live in a community without fluorinated water or with water that has a low fluoride content. Use fluoride supplements as directed by your dentist or health care provider.  Allow fluoride varnish applications to teeth if directed by your dentist or health care provider. Document Released: 08/17/2002 Document Revised: 04/11/2014 Document Reviewed: 11/27/2012 [facility] Patient Information 2015 [place], [place]. This information is not intended to replace advice given to you by your health care provider. Make sure you discuss any questions you have with your health care provider.  Vasovagal Syncope, Adult Syncope, commonly known as fainting, is a temporary loss of consciousness. It occurs when the blood flow to the brain is  reduced. Vasovagal syncope (also called neurocardiogenic syncope) is a fainting spell in which the blood flow to the brain is reduced because of a sudden drop in heart rate and blood pressure. Vasovagal syncope occurs when the brain and the cardiovascular system (blood vessels) do not adequately communicate and respond to each other. This is the most common cause of fainting. It often occurs in response to fear or some other type of emotional or physical stress. The body has a reaction in which the heart starts beating too slowly or the blood vessels expand, reducing blood pressure. This type of fainting spell is generally considered harmless. However, injuries can occur if a person takes a sudden fall during a fainting spell.  CAUSES  Vasovagal syncope occurs when a person's blood pressure and heart rate decrease suddenly, usually in response to a trigger. Many things and situations can trigger an episode. Some of these include:   Pain.   Fear.   The sight of blood or medical procedures, such as blood being drawn from a vein.   Common activities, such as coughing, swallowing, stretching, or going to the bathroom.   Emotional stress.  Prolonged standing, especially in a warm environment.   Lack of sleep or rest.   Prolonged lack of food.   Prolonged lack of fluids.   Recent illness.  The use of certain drugs that affect blood pressure, such as cocaine, alcohol, marijuana, inhalants, and opiates.  SYMPTOMS  Before the fainting episode, you may:   Feel dizzy or light headed.   Become pale.  Sense that you are going to faint.   Feel like the room is spinning.   Have tunnel vision, only seeing directly in front of you.   Feel sick to your stomach (nauseous).   See spots or slowly lose vision.   Hear ringing in your ears.   Have a headache.   Feel warm and sweaty.   Feel a sensation of pins and needles. During the fainting spell, you will generally be  unconscious for no longer than a couple minutes before waking up and returning to normal. If you get up too quickly before your body can recover, you may faint again. Some twitching or jerky movements may occur during the fainting spell.  DIAGNOSIS  Your caregiver will ask about your symptoms, take a medical history, and perform a physical exam. Various tests may be done to rule out other causes of fainting. These may include blood tests and tests to check the heart, such as electrocardiography, echocardiography, and possibly an electrophysiology study. When other causes have been ruled out, a test may be done to check the body's response to changes in position (tilt table test). TREATMENT  Most cases of vasovagal syncope do not require treatment. Your caregiver may recommend ways to avoid fainting triggers and may provide home strategies for preventing fainting. If you must be exposed to a possible trigger, you can drink additional fluids to help reduce your chances of having an episode of vasovagal syncope. If you have warning signs of an oncoming episode, you can respond by positioning yourself favorably (lying down). If your fainting spells continue, you may be given medicines to prevent fainting. Some medicines may help make you more resistant to repeated episodes of vasovagal syncope. Special exercises or compression stockings may be recommended. In rare cases, the surgical placement of a pacemaker is considered. HOME CARE INSTRUCTIONS   Learn to identify the warning signs of vasovagal syncope.   Sit or lie down at the first warning sign of a fainting spell. If sitting, put your head down between your legs. If you lie down, swing your legs up in the air to increase blood flow to the brain.   Avoid hot tubs and saunas.  Avoid prolonged standing.  Drink enough fluids to keep your urine clear or pale yellow. Avoid caffeine.  Increase salt in your diet as directed by your caregiver.   If you  have to stand for a long time, perform movements such as:   Crossing your legs.   Flexing and stretching your leg muscles.   Squatting.   Moving your legs.   Bending over.   Only take over-the-counter or prescription medicines as directed by your caregiver. Do not suddenly stop any medicines without asking your caregiver first. SEEK MEDICAL CARE IF:   Your fainting spells continue or happen more frequently in spite of treatment.   You lose consciousness for more than a couple minutes.  You have fainting spells during or after exercising or after being startled.   You have new symptoms that occur with the fainting spells, such as:   Shortness of breath.  Chest pain.   Irregular heartbeat.   You have episodes of twitching or jerky movements that last longer than a few seconds.  You have episodes of twitching or jerky movements without obvious fainting. SEEK IMMEDIATE MEDICAL CARE IF:   You have injuries or bleeding after a fainting spell.   You have episodes of twitching or jerky movements that last longer than 5 minutes.   You have more than one spell of twitching or jerky movements before returning to consciousness after fainting. MAKE SURE YOU:   Understand these instructions.  Will watch your condition.  Will get help right away if you are not doing well or get worse. Document Released: 11/11/2012 Document Reviewed: 11/11/2012 [facility] Patient Information 2015 [place], [place]. This information is not intended to replace advice given to you by your health care provider. Make sure you discuss any questions you have with your health care provider.

## 2015-08-27 NOTE — ED Notes (Signed)
Pt assisted out of car to wheelchair. Pt reports she got up around 230 am to use the bathroom and take some ibuprofen for a toothache to a tooth on the right upper szide. Pt reports she went outside on the porch and was talking with a friend and she passed out. Pt reports she hit her head but unsure of where. Pt alert and talking in full and complete sentences at this time.

## 2015-09-20 ENCOUNTER — Ambulatory Visit: Payer: Self-pay | Attending: Family Medicine

## 2015-10-19 ENCOUNTER — Emergency Department (INDEPENDENT_AMBULATORY_CARE_PROVIDER_SITE_OTHER)
Admission: EM | Admit: 2015-10-19 | Discharge: 2015-10-19 | Disposition: A | Discharge status: Home / Self Care | Payer: Self-pay | Source: Home / Self Care | Attending: Family Medicine | Admitting: Family Medicine

## 2015-10-19 ENCOUNTER — Encounter (HOSPITAL_COMMUNITY): Payer: Self-pay | Admitting: *Deleted

## 2015-10-19 DIAGNOSIS — J45901 Unspecified asthma with (acute) exacerbation: Secondary | ICD-10-CM

## 2015-10-19 DIAGNOSIS — K0889 Other specified disorders of teeth and supporting structures: Secondary | ICD-10-CM

## 2015-10-19 MED ORDER — ALBUTEROL SULFATE (2.5 MG/3ML) 0.083% IN NEBU
INHALATION_SOLUTION | RESPIRATORY_TRACT | Status: AC
  Filled 2015-10-19: qty 6

## 2015-10-19 MED ORDER — CLINDAMYCIN HCL 300 MG PO CAPS: 300.0000 mg | ORAL_CAPSULE | Freq: Three times a day (TID) | ORAL | Status: DC

## 2015-10-19 MED ORDER — ALBUTEROL SULFATE (2.5 MG/3ML) 0.083% IN NEBU
5.0000 mg | INHALATION_SOLUTION | Freq: Once | RESPIRATORY_TRACT | Status: AC
  Administered 2015-10-19: 5 mg via RESPIRATORY_TRACT

## 2015-10-19 MED ORDER — METHYLPREDNISOLONE SODIUM SUCC 125 MG IJ SOLR
INTRAMUSCULAR | Status: AC
  Filled 2015-10-19: qty 2

## 2015-10-19 MED ORDER — IPRATROPIUM BROMIDE 0.02 % IN SOLN
0.5000 mg | Freq: Once | RESPIRATORY_TRACT | Status: AC
  Administered 2015-10-19: 0.5 mg via RESPIRATORY_TRACT

## 2015-10-19 MED ORDER — IPRATROPIUM BROMIDE 0.02 % IN SOLN
RESPIRATORY_TRACT | Status: AC
  Filled 2015-10-19: qty 2.5

## 2015-10-19 MED ORDER — METHYLPREDNISOLONE SODIUM SUCC 125 MG IJ SOLR
125.0000 mg | Freq: Once | INTRAMUSCULAR | Status: AC
  Administered 2015-10-19: 125 mg via INTRAMUSCULAR

## 2015-10-19 MED ORDER — DICLOFENAC POTASSIUM 50 MG PO TABS: 50.0000 mg | ORAL_TABLET | Freq: Three times a day (TID) | ORAL | Status: DC

## 2015-10-19 NOTE — ED Provider Notes (Signed)
CSN: [id number]     Arrival date & time 10/19/15  1340 History   First MD Initiated Contact with Patient 10/19/15 1514     Chief Complaint  Patient presents with  . Dental Pain   (Consider location/radiation/quality/duration/timing/severity/associated sxs/prior Treatment) Patient is a 34 y.o. female presenting with tooth pain and shortness of breath. The history is provided by the patient.  Dental Pain Location:  Upper Upper teeth location:  4/RU 2nd bicuspid and 3/RU 1st molar Quality:  Throbbing Severity:  Moderate Onset quality:  Gradual Progression:  Worsening Chronicity:  Chronic Context: dental caries and poor dentition   Relieved by:  None tried Worsened by:  Nothing tried Associated symptoms: no facial swelling and no fever   Risk factors: lack of dental care and periodontal disease   Shortness of Breath Severity:  Mild Onset quality:  Gradual Duration:  2 days Progression:  Worsening Chronicity:  Chronic Context: weather changes   Associated symptoms: cough and wheezing   Associated symptoms: no fever     Past Medical History  Diagnosis Date  . Asthma   . Pregnancy as incidental finding   . Complication of anesthesia   . GERD (gastroesophageal reflux disease)    Past Surgical History  Procedure Laterality Date  . Nasal sinus surgery    . Cholecystectomy  2001  . Lypoma removal  2003    right arm   . Laparoscopic tubal ligation  08/19/2012    Procedure: LAPAROSCOPIC TUBAL LIGATION;  Surgeon: Lazaro ArmsLuther H Eure, MD;  Location: [facility];  Service: Gynecology;  Laterality: Bilateral;   History reviewed. No pertinent family history. Social History  Substance Use Topics  . Smoking status: Former Smoker -- 1.00 packs/day for 2 years    Types: Cigarettes    Quit date: 07/10/2011  . Smokeless tobacco: Never Used  . Alcohol Use: No     Comment: weekly   OB History    Gravida Para Term Preterm AB TAB SAB Ectopic Multiple Living   3 3 2 1  0 0 0 0 0 3     Review  of Systems  Constitutional: Negative.  Negative for fever.  HENT: Positive for dental problem. Negative for facial swelling.   Respiratory: Positive for cough, shortness of breath and wheezing.   All other systems reviewed and are negative.   Allergies  Review of patient's allergies indicates no known allergies.  Home Medications   Prior to Admission medications   Medication Sig Start Date End Date Taking? Authorizing Provider  acetaminophen (TYLENOL) 325 MG tablet Per bottle    Historical Provider, MD  albuterol (PROVENTIL HFA;VENTOLIN HFA) 108 (90 BASE) MCG/ACT inhaler Inhale 1-2 puffs into the lungs every 6 (six) hours as needed for wheezing. 08/03/15 08/02/16  Ambrose FinlandValerie A Keck, NP  albuterol (PROVENTIL) (2.5 MG/3ML) 0.083% nebulizer solution Take 3 mLs (2.5 mg total) by nebulization every 6 (six) hours as needed for wheezing. 08/27/13 08/27/14  Kalman ShanMurali Ramaswamy, MD  clindamycin (CLEOCIN) 300 MG capsule Take 1 capsule (300 mg total) by mouth 3 (three) times daily. 10/19/15   Linna HoffJames D Gean Laursen, MD  diclofenac (CATAFLAM) 50 MG tablet Take 1 tablet (50 mg total) by mouth 3 (three) times daily. 10/19/15   Linna HoffJames D Tarea Skillman, MD  esomeprazole (NEXIUM) 20 MG capsule Take 20 mg by mouth daily at 12 noon.    Historical Provider, MD  famotidine (PEPCID) 20 MG tablet Take 1 tablet (20 mg total) by mouth at bedtime. 12/08/12   Vilinda BlanksWilliam S Minor, NP  fexofenadine (ALLEGRA) 60 MG tablet Take 60 mg by mouth 2 (two) times daily.    Historical Provider, MD  fluticasone (FLONASE) 50 MCG/ACT nasal spray Place 2 sprays into both nostrils daily. 02/06/15   Ambrose Finland, NP  ibuprofen (ADVIL,MOTRIN) 200 MG tablet Per bottle    Historical Provider, MD  ipratropium (ATROVENT) 0.02 % nebulizer solution Take 2.5 mLs (0.5 mg total) by nebulization 4 (four) times daily. Patient not taking: Reported on 04/06/2015 05/09/14   Geoffery Lyons, MD  ipratropium-albuterol (DUONEB) 0.5-2.5 (3) MG/3ML SOLN USE ONE VIAL IN NEBULIZER TWICE DAILY  AS DIRECTED Patient not taking: Reported on 04/06/2015 08/31/13   Lazaro Arms, MD  metroNIDAZOLE (FLAGYL) 500 MG tablet Take 1 tablet (500 mg total) by mouth 2 (two) times daily. Patient not taking: Reported on 04/21/2015 04/11/15   Ambrose Finland, NP  mometasone-formoterol (DULERA) 200-5 MCG/ACT AERO Inhale 2 puffs into the lungs 2 (two) times daily. 04/07/15   Quentin Angst, MD  predniSONE (DELTASONE) 10 MG tablet Take 20 mg for 3 days, then 10 mg for 3 days 04/21/15   Ambrose Finland, NP  predniSONE (DELTASONE) 20 MG tablet Take 2 tablets (40 mg total) by mouth daily. 04/18/15 04/17/16  Phineas Semen, MD  triamcinolone cream (KENALOG) 0.1 % Apply 1 application topically 2 (two) times daily. 03/30/15   Ambrose Finland, NP   Meds Ordered and Administered this Visit   Medications  albuterol (PROVENTIL) (2.5 MG/3ML) 0.083% nebulizer solution 5 mg (5 mg Nebulization Given 10/19/15 1534)  ipratropium (ATROVENT) nebulizer solution 0.5 mg (0.5 mg Nebulization Given 10/19/15 1534)  methylPREDNISolone sodium succinate (SOLU-MEDROL) 125 mg/2 mL injection 125 mg (125 mg Intramuscular Given 10/19/15 1534)    BP 125/88 mmHg  Pulse 87  Temp(Src) 98.9 F (37.2 C) (Oral)  Resp 16  SpO2 99%  LMP 09/12/2015 No data found.   Physical Exam  ED Course  Procedures (including critical care time)  Labs Review Labs Reviewed - No data to display  Imaging Review No results found.   Visual Acuity Review  Right Eye Distance:   Left Eye Distance:   Bilateral Distance:    Right Eye Near:   Left Eye Near:    Bilateral Near:         MDM   1. Pain, dental   2. Asthma exacerbation    Lungs clear at d/c, sx improved.    Linna Hoff, MD 10/20/15 2030

## 2015-10-19 NOTE — Discharge Instructions (Signed)
Take medicine as prescribed, see your dentist as soon as possible °

## 2015-10-19 NOTE — ED Notes (Signed)
Pt  Reports       Multiple   Complaints  To  Include  Toothache        Shortness  Of  Breath     eczyma      Pt  Reports       She  Takes  Neb  Treatments         At  Home       pt  Is    Awake  And  Alert     No  Severe    Distress

## 2015-10-20 ENCOUNTER — Ambulatory Visit: Payer: Self-pay | Admitting: Internal Medicine

## 2015-10-27 ENCOUNTER — Encounter: Payer: Self-pay | Admitting: Internal Medicine

## 2015-10-27 ENCOUNTER — Ambulatory Visit: Payer: Self-pay | Attending: Internal Medicine | Admitting: Internal Medicine

## 2015-10-27 VITALS — BP 119/81 | HR 84 | Temp 98.0°F | Resp 16 | Ht 67.0 in | Wt 191.8 lb

## 2015-10-27 DIAGNOSIS — K219 Gastro-esophageal reflux disease without esophagitis: Secondary | ICD-10-CM | POA: Insufficient documentation

## 2015-10-27 DIAGNOSIS — Z79899 Other long term (current) drug therapy: Secondary | ICD-10-CM | POA: Insufficient documentation

## 2015-10-27 DIAGNOSIS — Z0000 Encounter for general adult medical examination without abnormal findings: Secondary | ICD-10-CM

## 2015-10-27 DIAGNOSIS — K029 Dental caries, unspecified: Secondary | ICD-10-CM | POA: Insufficient documentation

## 2015-10-27 DIAGNOSIS — J4541 Moderate persistent asthma with (acute) exacerbation: Secondary | ICD-10-CM | POA: Insufficient documentation

## 2015-10-27 DIAGNOSIS — Z87891 Personal history of nicotine dependence: Secondary | ICD-10-CM | POA: Insufficient documentation

## 2015-10-27 DIAGNOSIS — K0889 Other specified disorders of teeth and supporting structures: Secondary | ICD-10-CM

## 2015-10-27 DIAGNOSIS — L301 Dyshidrosis [pompholyx]: Secondary | ICD-10-CM | POA: Insufficient documentation

## 2015-10-27 MED ORDER — IPRATROPIUM BROMIDE 0.02 % IN SOLN: 0.5000 mg | Freq: Four times a day (QID) | RESPIRATORY_TRACT | Status: DC

## 2015-10-27 MED ORDER — TRIAMCINOLONE ACETONIDE 0.1 % EX CREA: 1.0000 "application " | TOPICAL_CREAM | Freq: Two times a day (BID) | CUTANEOUS | Status: DC

## 2015-10-27 MED ORDER — ALBUTEROL SULFATE (2.5 MG/3ML) 0.083% IN NEBU: 2.5000 mg | INHALATION_SOLUTION | Freq: Four times a day (QID) | RESPIRATORY_TRACT | Status: DC | PRN

## 2015-10-27 NOTE — Progress Notes (Signed)
Patient ID: Katrina Mitchell, female   DOB: 02/16/1981, 34 y.o.   MRN: [id number]  CC: urgent care follow up  HPI: Zollie BeckersSarah Pettway is a 34 y.o. female here today for a follow up visit.  Patient has past medical history of asthma and GERD. Patient reports that she was seen in the urgent care 7 days ago for a tooth ache and asthma exacerbation. She at that time was given a antibiotic and pain medication for her tooth ache. She states that she has had dental pain for over two months but has been unable to get in with the dentist. States that she has called several places to try to get her tooth pulled but they are expensive. She is requesting a refill of her nebulizer solutions and kenalog cream for her eczema.    No Known Allergies Past Medical History  Diagnosis Date  . Asthma   . Pregnancy as incidental finding   . Complication of anesthesia   . GERD (gastroesophageal reflux disease)    Current Outpatient Prescriptions on File Prior to Visit  Medication Sig Dispense Refill  . albuterol (PROVENTIL HFA;VENTOLIN HFA) 108 (90 BASE) MCG/ACT inhaler Inhale 1-2 puffs into the lungs every 6 (six) hours as needed for wheezing. 3 Inhaler 3  . clindamycin (CLEOCIN) 300 MG capsule Take 1 capsule (300 mg total) by mouth 3 (three) times daily. 21 capsule 0  . diclofenac (CATAFLAM) 50 MG tablet Take 1 tablet (50 mg total) by mouth 3 (three) times daily. 21 tablet 0  . esomeprazole (NEXIUM) 20 MG capsule Take 20 mg by mouth daily at 12 noon.    . famotidine (PEPCID) 20 MG tablet Take 1 tablet (20 mg total) by mouth at bedtime.    . fexofenadine (ALLEGRA) 60 MG tablet Take 60 mg by mouth 2 (two) times daily.    Marland Kitchen. ibuprofen (ADVIL,MOTRIN) 200 MG tablet Per bottle    . mometasone-formoterol (DULERA) 200-5 MCG/ACT AERO Inhale 2 puffs into the lungs 2 (two) times daily. 3 Inhaler 3  . acetaminophen (TYLENOL) 325 MG tablet Per bottle    . albuterol (PROVENTIL) (2.5 MG/3ML) 0.083% nebulizer solution Take 3 mLs (2.5 mg  total) by nebulization every 6 (six) hours as needed for wheezing. 75 mL 3  . fluticasone (FLONASE) 50 MCG/ACT nasal spray Place 2 sprays into both nostrils daily. 16 g 6  . ipratropium (ATROVENT) 0.02 % nebulizer solution Take 2.5 mLs (0.5 mg total) by nebulization 4 (four) times daily. (Patient not taking: Reported on 04/06/2015) 30 mL 2  . ipratropium-albuterol (DUONEB) 0.5-2.5 (3) MG/3ML SOLN USE ONE VIAL IN NEBULIZER TWICE DAILY AS DIRECTED (Patient not taking: Reported on 04/06/2015) 3 mL 11  . metroNIDAZOLE (FLAGYL) 500 MG tablet Take 1 tablet (500 mg total) by mouth 2 (two) times daily. (Patient not taking: Reported on 04/21/2015) 14 tablet 0  . predniSONE (DELTASONE) 10 MG tablet Take 20 mg for 3 days, then 10 mg for 3 days 9 tablet 0  . predniSONE (DELTASONE) 20 MG tablet Take 2 tablets (40 mg total) by mouth daily. 8 tablet 0  . triamcinolone cream (KENALOG) 0.1 % Apply 1 application topically 2 (two) times daily. 30 g 0   No current facility-administered medications on file prior to visit.   History reviewed. No pertinent family history. Social History   Social History  . Marital Status: Single    Spouse Name: N/A  . Number of Children: N/A  . Years of Education: N/A   Occupational History  .  Not on file.   Social History Main Topics  . Smoking status: Former Smoker -- 1.00 packs/day for 2 years    Types: Cigarettes    Quit date: 07/10/2011  . Smokeless tobacco: Never Used  . Alcohol Use: No     Comment: weekly  . Drug Use: No     Comment: reports smokes "here and there"  . Sexual Activity: Yes    Birth Control/ Protection: Other-see comments     Comment: 6 weeks postpartum   Other Topics Concern  . Not on file   Social History Narrative    Review of Systems: Other than what is stated in HPI, all other systems are negative.    Objective:   Filed Vitals:   10/27/15 1513  BP: 119/81  Pulse: 84  Temp: 98 F (36.7 C)  Resp: 16    Physical Exam   Constitutional: She is oriented to person, place, and time.  HENT:  Mouth/Throat: Dental caries present.  Poor dentition  Cardiovascular: Normal rate, regular rhythm and normal heart sounds.   Pulmonary/Chest: Effort normal and breath sounds normal. She has no wheezes.  Neurological: She is alert and oriented to person, place, and time.  Skin: Skin is warm and dry. Rash (web spaces of bilateral hands) noted.     Lab Results  Component Value Date   WBC 12.4* 08/27/2015   HGB 11.8* 08/27/2015   HCT 35.6 08/27/2015   MCV 80.2 08/27/2015   PLT 277 08/27/2015   Lab Results  Component Value Date   CREATININE 0.68 08/27/2015   BUN 14 08/27/2015   NA 138 08/27/2015   K 3.7 08/27/2015   CL 107 08/27/2015   CO2 26 08/27/2015    Lab Results  Component Value Date   HGBA1C 5.7* 04/06/2015   Lipid Panel     Component Value Date/Time   CHOL 231* 04/06/2015 0914   TRIG 161* 04/06/2015 0914   HDL 44* 04/06/2015 0914   CHOLHDL 5.3 04/06/2015 0914   VLDL 32 04/06/2015 0914   LDLCALC 155* 04/06/2015 0914       Assessment and plan:   Sagan was seen today for follow-up.  Diagnoses and all orders for this visit:  Pain, dental I have given her low cost dental options  Moderate persistent asthma, with acute exacerbation -     albuterol (PROVENTIL) (2.5 MG/3ML) 0.083% nebulizer solution; Take 3 mLs (2.5 mg total) by nebulization every 6 (six) hours as needed for wheezing. -     ipratropium (ATROVENT) 0.02 % nebulizer solution; Take 2.5 mLs (0.5 mg total) by nebulization 4 (four) times daily. Meds refilled, stable currently Flu shot given in office  Dyshidrotic eczema -    Refilled triamcinolone cream (KENALOG) 0.1 %; Apply 1 application topically 2 (two) times daily.   Return if symptoms worsen or fail to improve.       Ambrose Finland, NP-C [facility] [phone] 10/27/2015, 3:31 PM

## 2015-10-27 NOTE — Progress Notes (Signed)
Patient here for a follow up from the urgent care Patient was seen for her asthma and for tooth pain

## 2015-10-30 ENCOUNTER — Encounter: Payer: Self-pay | Admitting: Emergency Medicine

## 2015-10-30 ENCOUNTER — Emergency Department: Payer: Self-pay

## 2015-10-30 ENCOUNTER — Emergency Department
Admission: EM | Admit: 2015-10-30 | Discharge: 2015-10-30 | Disposition: A | Discharge status: Home / Self Care | Payer: Self-pay | Attending: Emergency Medicine | Admitting: Emergency Medicine

## 2015-10-30 DIAGNOSIS — Z79899 Other long term (current) drug therapy: Secondary | ICD-10-CM | POA: Insufficient documentation

## 2015-10-30 DIAGNOSIS — Z792 Long term (current) use of antibiotics: Secondary | ICD-10-CM | POA: Insufficient documentation

## 2015-10-30 DIAGNOSIS — R42 Dizziness and giddiness: Secondary | ICD-10-CM | POA: Insufficient documentation

## 2015-10-30 DIAGNOSIS — Z791 Long term (current) use of non-steroidal anti-inflammatories (NSAID): Secondary | ICD-10-CM | POA: Insufficient documentation

## 2015-10-30 DIAGNOSIS — M542 Cervicalgia: Secondary | ICD-10-CM | POA: Insufficient documentation

## 2015-10-30 DIAGNOSIS — Z7951 Long term (current) use of inhaled steroids: Secondary | ICD-10-CM | POA: Insufficient documentation

## 2015-10-30 DIAGNOSIS — Z87891 Personal history of nicotine dependence: Secondary | ICD-10-CM | POA: Insufficient documentation

## 2015-10-30 LAB — BASIC METABOLIC PANEL
Anion gap: 6 (ref 5–15)
BUN: 8 mg/dL (ref 6–20)
CHLORIDE: 107 mmol/L (ref 101–111)
CO2: 25 mmol/L (ref 22–32)
CREATININE: 0.7 mg/dL (ref 0.44–1.00)
Calcium: 8.5 mg/dL — ABNORMAL LOW (ref 8.9–10.3)
GFR calc non Af Amer: >60 mL/min (ref >60)
Glucose, Bld: 93 mg/dL (ref 65–99)
Potassium: 3.9 mmol/L (ref 3.5–5.1)
Sodium: 138 mmol/L (ref 135–145)

## 2015-10-30 LAB — CBC
HCT: 36.2 % (ref 35.0–47.0)
HEMOGLOBIN: 12.1 g/dL (ref 12.0–16.0)
MCH: 27.2 pg (ref 26.0–34.0)
MCHC: 33.3 g/dL (ref 32.0–36.0)
MCV: 81.6 fL (ref 80.0–100.0)
PLATELETS: 263 10*3/uL (ref 150–440)
RBC: 4.43 MIL/uL (ref 3.80–5.20)
RDW: 14.6 % — ABNORMAL HIGH (ref 11.5–14.5)
WBC: 6.9 10*3/uL (ref 3.6–11.0)

## 2015-10-30 NOTE — Discharge Instructions (Signed)
Please seek medical attention for any high fevers, chest pain, shortness of breath, change in behavior, persistent vomiting, bloody stool or any other new or concerning symptoms. ° ° °Dizziness °Dizziness is a common problem. It is a feeling of unsteadiness or light-headedness. You may feel like you are about to faint. Dizziness can lead to injury if you stumble or fall. Anyone can become dizzy, but dizziness is more common in older adults. This condition can be caused by a number of things, including medicines, dehydration, or illness. °HOME CARE INSTRUCTIONS °Taking these steps may help with your condition: °Eating and Drinking °· Drink enough fluid to keep your urine clear or pale yellow. This helps to keep you from becoming dehydrated. Try to drink more clear fluids, such as water. °· Do not drink alcohol. °· Limit your caffeine intake if directed by your health care provider. °· Limit your salt intake if directed by your health care provider. °Activity °· Avoid making quick movements. °¨ Rise slowly from chairs and steady yourself until you feel okay. °¨ In the morning, first sit up on the side of the bed. When you feel okay, stand slowly while you hold onto something until you know that your balance is fine. °· Move your legs often if you need to stand in one place for a long time. Tighten and relax your muscles in your legs while you are standing. °· Do not drive or operate heavy machinery if you feel dizzy. °· Avoid bending down if you feel dizzy. Place items in your home so that they are easy for you to reach without leaning over. °Lifestyle °· Do not use any tobacco products, including cigarettes, chewing tobacco, or electronic cigarettes. If you need help quitting, ask your health care provider. °· Try to reduce your stress level, such as with yoga or meditation. Talk with your health care provider if you need help. °General Instructions °· Watch your dizziness for any changes. °· Take medicines only as  directed by your health care provider. Talk with your health care provider if you think that your dizziness is caused by a medicine that you are taking. °· Tell a friend or a family member that you are feeling dizzy. If he or she notices any changes in your behavior, have this person call your health care provider. °· Keep all follow-up visits as directed by your health care provider. This is important. °SEEK MEDICAL CARE IF: °· Your dizziness does not go away. °· Your dizziness or light-headedness gets worse. °· You feel nauseous. °· You have reduced hearing. °· You have new symptoms. °· You are unsteady on your feet or you feel like the room is spinning. °SEEK IMMEDIATE MEDICAL CARE IF: °· You vomit or have diarrhea and are unable to eat or drink anything. °· You have problems talking, walking, swallowing, or using your arms, hands, or legs. °· You feel generally weak. °· You are not thinking clearly or you have trouble forming sentences. It may take a friend or family member to notice this. °· You have chest pain, abdominal pain, shortness of breath, or sweating. °· Your vision changes. °· You notice any bleeding. °· You have a headache. °· You have neck pain or a stiff neck. °· You have a fever. °  °This information is not intended to replace advice given to you by your health care provider. Make sure you discuss any questions you have with your health care provider. °  °Document Released: 05/21/2001 Document Revised: 04/11/2015   Document Reviewed: 11/21/2014 °Elsevier Interactive Patient Education ©2016 [employer] ° °

## 2015-10-30 NOTE — ED Provider Notes (Signed)
[facility] Emergency Department Provider Note    ____________________________________________  Time seen: 1555  I have reviewed the triage vital signs and the nursing notes.   HISTORY  Chief Complaint Dizziness   History limited by: Not Limited   HPI Katrina Mitchell is a 34 y.o. female who presents to the emergency department today after an episode of dizziness. The patient states that she was at work when she started feeling dizzy. She describes it as a sense of feeling like she is on a boat. The patient states that she took 2 aspirin and started to feel better. The patient states that in addition to the feeling of dizziness she had some right neck pain. Patient states that she had similar episode roughly 1 year ago. It is unclear the exact diagnosis although the patient thinks she was told she had a TIA however she never followed up with neurology and was only told to take a baby aspirin. Patient denies any recent fevers.   Past Medical History  Diagnosis Date  . Asthma   . Pregnancy as incidental finding   . Complication of anesthesia   . GERD (gastroesophageal reflux disease)     Patient Active Problem List   Diagnosis Date Noted  . Moderate persistent asthma, high risk. Status post intubation December 2013 01/14/2013  . Status asthmaticus 12/06/2012  . Sterilization 07/01/2012    Past Surgical History  Procedure Laterality Date  . Nasal sinus surgery    . Cholecystectomy  2001  . Lypoma removal  2003    right arm   . Laparoscopic tubal ligation  08/19/2012    Procedure: LAPAROSCOPIC TUBAL LIGATION;  Surgeon: Lazaro ArmsLuther H Eure, MD;  Location: [facility];  Service: Gynecology;  Laterality: Bilateral;    Current Outpatient Rx  Name  Route  Sig  Dispense  Refill  . acetaminophen (TYLENOL) 325 MG tablet      Per bottle         . albuterol (PROVENTIL HFA;VENTOLIN HFA) 108 (90 BASE) MCG/ACT inhaler   Inhalation   Inhale 1-2 puffs into the lungs  every 6 (six) hours as needed for wheezing.   3 Inhaler   3   . albuterol (PROVENTIL) (2.5 MG/3ML) 0.083% nebulizer solution   Nebulization   Take 3 mLs (2.5 mg total) by nebulization every 6 (six) hours as needed for wheezing.   75 mL   3   . clindamycin (CLEOCIN) 300 MG capsule   Oral   Take 1 capsule (300 mg total) by mouth 3 (three) times daily.   21 capsule   0   . diclofenac (CATAFLAM) 50 MG tablet   Oral   Take 1 tablet (50 mg total) by mouth 3 (three) times daily.   21 tablet   0   . esomeprazole (NEXIUM) 20 MG capsule   Oral   Take 20 mg by mouth daily at 12 noon.         . famotidine (PEPCID) 20 MG tablet   Oral   Take 1 tablet (20 mg total) by mouth at bedtime.         . fexofenadine (ALLEGRA) 60 MG tablet   Oral   Take 60 mg by mouth 2 (two) times daily.         Marland Kitchen. ibuprofen (ADVIL,MOTRIN) 200 MG tablet      Per bottle         . ipratropium (ATROVENT) 0.02 % nebulizer solution   Nebulization   Take 2.5  mLs (0.5 mg total) by nebulization 4 (four) times daily.   30 mL   2   . mometasone-formoterol (DULERA) 200-5 MCG/ACT AERO   Inhalation   Inhale 2 puffs into the lungs 2 (two) times daily.   3 Inhaler   3   . triamcinolone cream (KENALOG) 0.1 %   Topical   Apply 1 application topically 2 (two) times daily.   30 g   1     Allergies Review of patient's allergies indicates no known allergies.  No family history on file.  Social History Social History  Substance Use Topics  . Smoking status: Former Smoker -- 1.00 packs/day for 2 years    Types: Cigarettes    Quit date: 07/10/2011  . Smokeless tobacco: Never Used  . Alcohol Use: No     Comment: weekly    Review of Systems  Constitutional: Negative for fever. Cardiovascular: Negative for chest pain. Respiratory: Negative for shortness of breath. Gastrointestinal: Negative for abdominal pain, vomiting and diarrhea. Genitourinary: Negative for dysuria. Musculoskeletal: Negative  for back pain. Skin: Negative for rash. Neurological: Negative for headaches, focal weakness or numbness.  10-point ROS otherwise negative.  ____________________________________________   PHYSICAL EXAM:  VITAL SIGNS: ED Triage Vitals  Enc Vitals Group     BP 10/30/15 1300 125/76 mmHg     Pulse Rate 10/30/15 1300 94     Resp 10/30/15 1300 15     Temp 10/30/15 1300 97.6 F (36.4 C)     Temp Source 10/30/15 1300 Oral     SpO2 10/30/15 1300 100 %     Weight 10/30/15 1300 191 lb (86.637 kg)     Height 10/30/15 1300  (1.702 m)   Constitutional: Alert and oriented. Well appearing and in no distress. Eyes: Conjunctivae are normal. PERRL. Normal extraocular movements. ENT   Head: Normocephalic and atraumatic.   Nose: No congestion/rhinnorhea.   Mouth/Throat: Mucous membranes are moist.   Neck: No stridor. Hematological/Lymphatic/Immunilogical: No cervical lymphadenopathy. Cardiovascular: Normal rate, regular rhythm.  No murmurs, rubs, or gallops. Respiratory: Normal respiratory effort without tachypnea nor retractions. Breath sounds are clear and equal bilaterally. No wheezes/rales/rhonchi. Gastrointestinal: Soft and nontender. No distention. There is no CVA tenderness. Genitourinary: Deferred Musculoskeletal: Normal range of motion in all extremities. No joint effusions.  No lower extremity tenderness nor edema. Neurologic:  Normal speech and language. No gross focal neurologic deficits are appreciated. Strength 5 out of 5 in upper and lower extremities. Sensation grossly intact. Face symmetric. Tongue midline. No drift. Skin:  Skin is warm, dry and intact. No rash noted. Psychiatric: Mood and affect are normal. Speech and behavior are normal. Patient exhibits appropriate insight and judgment.  ____________________________________________    LABS (pertinent positives/negatives)  Labs Reviewed  BASIC METABOLIC PANEL - Abnormal; Notable for the following:     Calcium 8.5 (*)    All other components within normal limits  CBC - Abnormal; Notable for the following:    RDW 14.6 (*)    All other components within normal limits     ____________________________________________   EKG  I, Phineas Semen, attending physician, personally viewed and interpreted this EKG  EKG Time: 1308 Rate: 81 Rhythm: NSR Axis: normal Intervals: qtc 420 QRS: narrow ST changes: no st elevation Impression: normal ekg ____________________________________________    RADIOLOGY  CT Head  IMPRESSION: 1. No acute intracranial abnormality. 2. Progression of inflammatory paranasal sinus disease compared to 08/12/2014. This may represent acute on chronic sinusitis versus progression of chronic changes.  ____________________________________________   PROCEDURES  Procedure(s) performed: None  Critical Care performed: No  ____________________________________________   INITIAL IMPRESSION / ASSESSMENT AND PLAN / ED COURSE  Pertinent labs & imaging results that were available during my care of the patient were reviewed by me and considered in my medical decision making (see chart for details).  Patient presents to the emergency department today because of concerns for dizziness. Patient currently feels much better. Blood work and head CT without concerning findings. At this point unclear cause of the neck pain and dizziness. I think it is been being extremely unlikely if this was truly a TIA given the presence of the neck pain. I will however give patient follow up with neurology given that she states family history of MS.  ____________________________________________   FINAL CLINICAL IMPRESSION(S) / ED DIAGNOSES  Final diagnoses:  Dizziness     Phineas Semen, MD 10/30/15 1606

## 2015-10-30 NOTE — ED Notes (Signed)
Pt presents with dizziness started today around lunch at work, was up walking around and become dizzy with pain to the right side of her neck. Pt states she had a TIA one year ago and sx were similar.  Pt in no acute distress at this time. No slurred speech, no facial droop. Pt ambulatory.

## 2015-10-30 NOTE — ED Notes (Signed)
Pt reports episode of dizziness while at work today.  No n/v/d.  No headache.  Pt had tightness in right side of neck.  Took 650mg  asa and now has no pain.  No headache.  Alert. Speech clear.  Intermittent dizziness.  No dizziness now.

## 2015-10-30 NOTE — ED Notes (Signed)
Pt discharged home after verbalizing understanding of discharge instructions; nad noted. 

## 2015-10-31 ENCOUNTER — Encounter: Payer: Self-pay | Admitting: Internal Medicine

## 2015-10-31 ENCOUNTER — Ambulatory Visit: Payer: Self-pay | Attending: Internal Medicine | Admitting: Internal Medicine

## 2015-10-31 DIAGNOSIS — Z7951 Long term (current) use of inhaled steroids: Secondary | ICD-10-CM | POA: Insufficient documentation

## 2015-10-31 DIAGNOSIS — Z79899 Other long term (current) drug therapy: Secondary | ICD-10-CM | POA: Insufficient documentation

## 2015-10-31 DIAGNOSIS — Z87891 Personal history of nicotine dependence: Secondary | ICD-10-CM | POA: Insufficient documentation

## 2015-10-31 DIAGNOSIS — R42 Dizziness and giddiness: Secondary | ICD-10-CM

## 2015-10-31 DIAGNOSIS — J45909 Unspecified asthma, uncomplicated: Secondary | ICD-10-CM | POA: Insufficient documentation

## 2015-10-31 DIAGNOSIS — K0889 Other specified disorders of teeth and supporting structures: Secondary | ICD-10-CM

## 2015-10-31 DIAGNOSIS — K219 Gastro-esophageal reflux disease without esophagitis: Secondary | ICD-10-CM | POA: Insufficient documentation

## 2015-10-31 NOTE — Progress Notes (Signed)
Patient ID: Katrina Mitchell, female   DOB: [birth year], 34 y.o.   MRN: [id number]  CC: dizziness, neck discomfort  HPI: Katrina Mitchell is a 34 y.o. female here today for a follow up visit.  Patient has past medical history of asthma and GERD. Patient was evaluated in the ED yesterday for dizziness and neck pain. She states that she has been having a "weird sensation" in her neck that is similar to when she was thought to have a TIA last year. She denies speech difficulty as prior. Sensation described as a tightness and tingling. She states that she cannot fully explain the sensation but it occurred with the dizziness and she felt disoriented. It is not associated with headaches or feels like a muscle strain. After sitting and relaxing the sensation and dizziness never improved. She is requesting a referral to the Neurology because she has a family history of MS. She denies any muscle weakness, parastheia, visual disturbances, or pain. She denies tinnitus or falls. She does note that she has had a dental infection for several months and has been going to the urgent care to get antibiotics until she is able to afford a dental visit. Patient reports that she just completed a course of antibiotics.    Patient has No headache, No chest pain, No abdominal pain - No Nausea, No new weakness tingling or numbness, No Cough - SOB.  No Known Allergies Past Medical History  Diagnosis Date  . Asthma   . Pregnancy as incidental finding   . Complication of anesthesia   . GERD (gastroesophageal reflux disease)    Current Outpatient Prescriptions on File Prior to Visit  Medication Sig Dispense Refill  . acetaminophen (TYLENOL) 325 MG tablet Per bottle    . albuterol (PROVENTIL HFA;VENTOLIN HFA) 108 (90 BASE) MCG/ACT inhaler Inhale 1-2 puffs into the lungs every 6 (six) hours as needed for wheezing. 3 Inhaler 3  . albuterol (PROVENTIL) (2.5 MG/3ML) 0.083% nebulizer solution Take 3 mLs (2.5 mg total) by nebulization  every 6 (six) hours as needed for wheezing. 75 mL 3  . clindamycin (CLEOCIN) 300 MG capsule Take 1 capsule (300 mg total) by mouth 3 (three) times daily. 21 capsule 0  . diclofenac (CATAFLAM) 50 MG tablet Take 1 tablet (50 mg total) by mouth 3 (three) times daily. 21 tablet 0  . esomeprazole (NEXIUM) 20 MG capsule Take 20 mg by mouth daily at 12 noon.    . famotidine (PEPCID) 20 MG tablet Take 1 tablet (20 mg total) by mouth at bedtime.    . fexofenadine (ALLEGRA) 60 MG tablet Take 60 mg by mouth 2 (two) times daily.    Marland Kitchen ibuprofen (ADVIL,MOTRIN) 200 MG tablet Per bottle    . ipratropium (ATROVENT) 0.02 % nebulizer solution Take 2.5 mLs (0.5 mg total) by nebulization 4 (four) times daily. 30 mL 2  . mometasone-formoterol (DULERA) 200-5 MCG/ACT AERO Inhale 2 puffs into the lungs 2 (two) times daily. 3 Inhaler 3  . triamcinolone cream (KENALOG) 0.1 % Apply 1 application topically 2 (two) times daily. 30 g 1   No current facility-administered medications on file prior to visit.   No family history on file. Social History   Social History  . Marital Status: Single    Spouse Name: N/A  . Number of Children: N/A  . Years of Education: N/A   Occupational History  . Not on file.   Social History Main Topics  . Smoking status: Former Smoker -- 1.00 packs/day for  2 years    Types: Cigarettes    Quit date: 07/10/2011  . Smokeless tobacco: Never Used  . Alcohol Use: No     Comment: weekly  . Drug Use: No     Comment: reports smokes "here and there"  . Sexual Activity: Yes    Birth Control/ Protection: Other-see comments     Comment: 6 weeks postpartum   Other Topics Concern  . Not on file   Social History Narrative    Review of Systems: Other than what is stated in HPI, all other systems are negative.    Objective:  See orthostatic vitals in chart   Physical Exam  Constitutional: She is oriented to person, place, and time.  Cardiovascular: Normal rate, regular rhythm and  normal heart sounds.   Pulmonary/Chest: Effort normal.  Neurological: She is alert and oriented to person, place, and time. She displays normal reflexes. No cranial nerve deficit. Coordination normal.  Skin: Skin is warm and dry.  Psychiatric: She has a normal mood and affect.     Lab Results  Component Value Date   WBC 6.9 10/30/2015   HGB 12.1 10/30/2015   HCT 36.2 10/30/2015   MCV 81.6 10/30/2015   PLT 263 10/30/2015   Lab Results  Component Value Date   CREATININE 0.70 10/30/2015   BUN 8 10/30/2015   NA 138 10/30/2015   K 3.9 10/30/2015   CL 107 10/30/2015   CO2 25 10/30/2015    Lab Results  Component Value Date   HGBA1C 5.7* 04/06/2015   Lipid Panel     Component Value Date/Time   CHOL 231* 04/06/2015 0914   TRIG 161* 04/06/2015 0914   HDL 44* 04/06/2015 0914   CHOLHDL 5.3 04/06/2015 0914   VLDL 32 04/06/2015 0914   LDLCALC 155* 04/06/2015 0914       Assessment and plan:   Katrina Mitchell was seen today for follow-up.  Diagnoses and all orders for this visit:  Dizziness -     Ambulatory referral to Neurology I have explained to patient that her symptoms do not sound consistent with MS. She states that since she has had these symptoms in the past she still would like to get evaluated by Neurology  Dental pain I have stressed that she find a dentist asap who can extract her teeth. Explained that long term dental infections can have serious consequences and long term antibiotics is not recommended. Neck discomfort could be from long standing dental infection.  Return if symptoms worsen or fail to improve.    Ambrose FinlandValerie A Keck, NP-C [facility] [phone] 10/31/2015, 3:39 PM

## 2015-10-31 NOTE — Progress Notes (Signed)
Patient here for follow up from the ED Was seen yesterday for some dizziness and "weird" feeling along the right Side of her neck

## 2015-11-16 ENCOUNTER — Encounter (HOSPITAL_COMMUNITY): Payer: Self-pay | Admitting: *Deleted

## 2015-11-16 ENCOUNTER — Emergency Department (INDEPENDENT_AMBULATORY_CARE_PROVIDER_SITE_OTHER)
Admission: EM | Admit: 2015-11-16 | Discharge: 2015-11-16 | Disposition: A | Discharge status: Home / Self Care | Payer: Self-pay | Source: Home / Self Care | Attending: Family Medicine | Admitting: Family Medicine

## 2015-11-16 DIAGNOSIS — K0889 Other specified disorders of teeth and supporting structures: Secondary | ICD-10-CM

## 2015-11-16 MED ORDER — DICLOFENAC POTASSIUM 50 MG PO TABS: 50.0000 mg | ORAL_TABLET | Freq: Three times a day (TID) | ORAL | Status: DC

## 2015-11-16 MED ORDER — CLINDAMYCIN HCL 300 MG PO CAPS
300.0000 mg | ORAL_CAPSULE | Freq: Three times a day (TID) | ORAL | Status: DC
Start: 2015-11-16 — End: 2015-12-27

## 2015-11-16 NOTE — ED Provider Notes (Signed)
CSN: [id number]     Arrival date & time 11/16/15  1308 History   First MD Initiated Contact with Patient 11/16/15 1421     Chief Complaint  Patient presents with  . Dental Pain   (Consider location/radiation/quality/duration/timing/severity/associated sxs/prior Treatment) Patient is a 34 y.o. female presenting with tooth pain. The history is provided by the patient.  Dental Pain Location:  Upper Upper teeth location:  6/RU cuspid and 5/RU 1st bicuspid Quality:  Throbbing and burning Severity:  Moderate Duration:  3 days Progression:  Worsening Chronicity:  Chronic Context: poor dentition   Context comment:  S/p extractions in sept with only partial resolution of sx. Relieved by:  None tried Worsened by:  Nothing tried Ineffective treatments:  None tried Associated symptoms: gum swelling and oral lesions   Associated symptoms: no facial swelling     Past Medical History  Diagnosis Date  . Asthma   . Pregnancy as incidental finding   . Complication of anesthesia   . GERD (gastroesophageal reflux disease)    Past Surgical History  Procedure Laterality Date  . Nasal sinus surgery    . Cholecystectomy  2001  . Lypoma removal  2003    right arm   . Laparoscopic tubal ligation  08/19/2012    Procedure: LAPAROSCOPIC TUBAL LIGATION;  Surgeon: Lazaro ArmsLuther H Eure, MD;  Location: [facility];  Service: Gynecology;  Laterality: Bilateral;   No family history on file. Social History  Substance Use Topics  . Smoking status: Former Smoker -- 1.00 packs/day for 2 years    Types: Cigarettes    Quit date: 07/10/2011  . Smokeless tobacco: Never Used  . Alcohol Use: No     Comment: weekly   OB History    Gravida Para Term Preterm AB TAB SAB Ectopic Multiple Living   3 3 2 1  0 0 0 0 0 3     Review of Systems  Constitutional: Negative.   HENT: Positive for dental problem and mouth sores. Negative for facial swelling.   All other systems reviewed and are negative.   Allergies  Review of  patient's allergies indicates no known allergies.  Home Medications   Prior to Admission medications   Medication Sig Start Date End Date Taking? Authorizing Provider  acetaminophen (TYLENOL) 325 MG tablet Per bottle    Historical Provider, MD  albuterol (PROVENTIL HFA;VENTOLIN HFA) 108 (90 BASE) MCG/ACT inhaler Inhale 1-2 puffs into the lungs every 6 (six) hours as needed for wheezing. 08/03/15 08/02/16  Ambrose FinlandValerie A Keck, NP  albuterol (PROVENTIL) (2.5 MG/3ML) 0.083% nebulizer solution Take 3 mLs (2.5 mg total) by nebulization every 6 (six) hours as needed for wheezing. 10/27/15 10/26/16  Ambrose FinlandValerie A Keck, NP  clindamycin (CLEOCIN) 300 MG capsule Take 1 capsule (300 mg total) by mouth 3 (three) times daily. 11/16/15   Linna HoffJames D Wlliam Grosso, MD  diclofenac (CATAFLAM) 50 MG tablet Take 1 tablet (50 mg total) by mouth 3 (three) times daily. For dental pain 11/16/15   Linna HoffJames D Mira Balon, MD  esomeprazole (NEXIUM) 20 MG capsule Take 20 mg by mouth daily at 12 noon.    Historical Provider, MD  famotidine (PEPCID) 20 MG tablet Take 1 tablet (20 mg total) by mouth at bedtime. 12/08/12   Vilinda BlanksWilliam S Minor, NP  fexofenadine (ALLEGRA) 60 MG tablet Take 60 mg by mouth 2 (two) times daily.    Historical Provider, MD  ibuprofen (ADVIL,MOTRIN) 200 MG tablet Per bottle    Historical Provider, MD  ipratropium (ATROVENT) 0.02 %  nebulizer solution Take 2.5 mLs (0.5 mg total) by nebulization 4 (four) times daily. 10/27/15   Ambrose Finland, NP  mometasone-formoterol (DULERA) 200-5 MCG/ACT AERO Inhale 2 puffs into the lungs 2 (two) times daily. 04/07/15   Quentin Angst, MD  triamcinolone cream (KENALOG) 0.1 % Apply 1 application topically 2 (two) times daily. 10/27/15   Ambrose Finland, NP   Meds Ordered and Administered this Visit  Medications - No data to display  BP 127/80 mmHg  Pulse 82  Temp(Src) 97.9 F (36.6 C) (Oral)  Resp 16  SpO2 99%  LMP 10/10/2015 (Approximate) No data found.   Physical Exam  Constitutional:  She is oriented to person, place, and time. She appears well-developed and well-nourished. She appears distressed.  HENT:  Right Ear: External ear normal.  Left Ear: External ear normal.  Mouth/Throat: Oral lesions present. Abnormal dentition.    Eyes: Pupils are equal, round, and reactive to light.  Neck: Normal range of motion. Neck supple.  Lymphadenopathy:    She has no cervical adenopathy.  Neurological: She is alert and oriented to person, place, and time.  Skin: Skin is warm and dry.  Nursing note and vitals reviewed.   ED Course  Procedures (including critical care time)  Labs Review Labs Reviewed - No data to display  Imaging Review No results found.   Visual Acuity Review  Right Eye Distance:   Left Eye Distance:   Bilateral Distance:    Right Eye Near:   Left Eye Near:    Bilateral Near:         MDM   1. Pain, dental        Linna Hoff, MD 11/16/15 1438

## 2015-11-16 NOTE — ED Notes (Signed)
Called for patient at 1355 N/A

## 2015-11-16 NOTE — Discharge Instructions (Signed)
Take medicine as prescribed, see your dentist as soon as possible °

## 2015-11-16 NOTE — ED Notes (Signed)
Pt     Reports    Toothache     r     Upper    Side      Of  Mouth        States  This  Is   Ongoing  Problem            Pt     Reports    X  3-4   Days    States  Unable  To  See  A  Dentist  Due  To  Finances

## 2015-12-14 MED FILL — !DULERA 200 MCG/5 MCG INH: 200-5 | 26 days supply | Qty: 13 | Fill #4

## 2015-12-14 MED FILL — !PROVENTIL HFA 90 MCG INH: 108 (90 BAS | 30 days supply | Qty: 1 | Fill #6

## 2015-12-27 ENCOUNTER — Ambulatory Visit (INDEPENDENT_AMBULATORY_CARE_PROVIDER_SITE_OTHER): Payer: Self-pay | Admitting: Neurology

## 2015-12-27 ENCOUNTER — Encounter: Payer: Self-pay | Admitting: Neurology

## 2015-12-27 VITALS — BP 114/80 | HR 86 | Ht 66.0 in | Wt 200.0 lb

## 2015-12-27 DIAGNOSIS — R42 Dizziness and giddiness: Secondary | ICD-10-CM | POA: Insufficient documentation

## 2015-12-27 DIAGNOSIS — R410 Disorientation, unspecified: Secondary | ICD-10-CM | POA: Insufficient documentation

## 2015-12-27 NOTE — Progress Notes (Signed)
NEUROLOGY CONSULTATION NOTE  Katrina Mitchell MRN: [id number] DOB: 02/16/1981  Referring provider: Holland Commons, NP Primary care provider: Holland Commons, NP  Reason for consult:  dizziness  Thank you for your kind referral of Katrina Mitchell for consultation of the above symptoms. Although her history is well known to you, please allow me to reiterate it for the purpose of our medical record. Records and images were personally reviewed where available.  HISTORY OF PRESENT ILLNESS: This is a pleasant 35 year old right-handed woman with a history of asthma presenting for evaluation of recurrent episodes that started in September 2015, she started having a strange sensation in the back of her neck on the right side, "like a tightness," then she started feeling confused and dizzy. She described the confusion as not knowing what was going on, but she could understand people around her. Dizziness is described as trouble walking rather than true vertigo. She was unable to speak for 5-10 minutes. There was no associated headache or focal numbness/tingling/weakness. She went to [facility] where she reports having a head CT, report unavailable for review. She was told she may or may not have had a TIA. Symptoms cleared up in 1-2 hours. She had a similar episode last 10/30/15 while at work, she started having the same neck symptoms followed by confusion and dizziness. She went to [facility] ED, CBC and BMP were normal. I personally reviewed head CT without contrast which did not show any acute changes, there was note of progression of inflammatory paranasal sinus disease, which may represent acute on chronic sinusitis versus progression of chronic changes. Since then, she has had 2 more dizzy episodes where she "almost felt like I would get the flu but did not," no associated neck symptoms or confusion, lasting 1-2 hours. She reports sleep is "always pretty off," sometimes she gets 7-8 hours of sleep, other times she only  gets 2-3 hours, but cannot identify this as a trigger to the above episodes.  She denies any history of headaches. She denies any vision changes, diplopia, dysarthria, dysphagia, tinnitus, hearing loss, chronic neck/back pain, focal numbness/tingling/weakness, bowel/bladder dysfunction. She denies any olfactory/gustatory hallucinations, deja vu sensation, rising epigastric sensation, or myoclonic jerks. She reports a syncopal episode when she had a tooth abscess in January 2015, no other episodes of loss of consciousness. Her mother has MS. She had a normal birth and early development, no history of febrile convulsions, CNS infections, significant head injuries, neurosurgical procedures, or family history of seizures.   Laboratory Data: Lab Results  Component Value Date   WBC 6.9 10/30/2015   HGB 12.1 10/30/2015   HCT 36.2 10/30/2015   MCV 81.6 10/30/2015   PLT 263 10/30/2015     Chemistry      Component Value Date/Time   NA 138 10/30/2015 1307   NA 140 08/12/2014 2148   K 3.9 10/30/2015 1307   K 3.8 08/12/2014 2148   CL 107 10/30/2015 1307   CL 106 08/12/2014 2148   CO2 25 10/30/2015 1307   CO2 26 08/12/2014 2148   BUN 8 10/30/2015 1307   BUN 8 08/12/2014 2148   CREATININE 0.70 10/30/2015 1307   CREATININE 0.73 08/12/2014 2148      Component Value Date/Time   CALCIUM 8.5* 10/30/2015 1307   CALCIUM 8.3* 08/12/2014 2148   ALKPHOS 54 08/12/2014 2148   ALKPHOS 66 12/24/2013 2145   AST 19 08/12/2014 2148   AST 20 12/24/2013 2145   ALT 17 08/12/2014  2148   ALT 12 12/24/2013 2145   BILITOT 0.1* 08/12/2014 2148   BILITOT <0.2* 12/24/2013 2145       PAST MEDICAL HISTORY: Past Medical History  Diagnosis Date  . Asthma   . Pregnancy as incidental finding   . Complication of anesthesia   . GERD (gastroesophageal reflux disease)     PAST SURGICAL HISTORY: Past Surgical History  Procedure Laterality Date  . Nasal sinus surgery    . Cholecystectomy  2001  . Lypoma removal   2003    right arm   . Laparoscopic tubal ligation  08/19/2012    Procedure: LAPAROSCOPIC TUBAL LIGATION;  Surgeon: Lazaro Arms, MD;  Location: [facility];  Service: Gynecology;  Laterality: Bilateral;    MEDICATIONS: Current Outpatient Prescriptions on File Prior to Visit  Medication Sig Dispense Refill  . acetaminophen (TYLENOL) 325 MG tablet Per bottle    . albuterol (PROVENTIL HFA;VENTOLIN HFA) 108 (90 BASE) MCG/ACT inhaler Inhale 1-2 puffs into the lungs every 6 (six) hours as needed for wheezing. 3 Inhaler 3  . albuterol (PROVENTIL) (2.5 MG/3ML) 0.083% nebulizer solution Take 3 mLs (2.5 mg total) by nebulization every 6 (six) hours as needed for wheezing. 75 mL 3  . clindamycin (CLEOCIN) 300 MG capsule Take 1 capsule (300 mg total) by mouth 3 (three) times daily. 21 capsule 0  . diclofenac (CATAFLAM) 50 MG tablet Take 1 tablet (50 mg total) by mouth 3 (three) times daily. For dental pain 21 tablet 0  . esomeprazole (NEXIUM) 20 MG capsule Take 20 mg by mouth daily at 12 noon.    . famotidine (PEPCID) 20 MG tablet Take 1 tablet (20 mg total) by mouth at bedtime.    . fexofenadine (ALLEGRA) 60 MG tablet Take 60 mg by mouth 2 (two) times daily.    Marland Kitchen ibuprofen (ADVIL,MOTRIN) 200 MG tablet Per bottle    . ipratropium (ATROVENT) 0.02 % nebulizer solution Take 2.5 mLs (0.5 mg total) by nebulization 4 (four) times daily. 30 mL 2  . mometasone-formoterol (DULERA) 200-5 MCG/ACT AERO Inhale 2 puffs into the lungs 2 (two) times daily. 3 Inhaler 3  . triamcinolone cream (KENALOG) 0.1 % Apply 1 application topically 2 (two) times daily. 30 g 1   No current facility-administered medications on file prior to visit.    ALLERGIES: No Known Allergies  FAMILY HISTORY: No family history on file.  SOCIAL HISTORY: Social History   Social History  . Marital Status: Single    Spouse Name: N/A  . Number of Children: N/A  . Years of Education: N/A   Occupational History  . Not on file.   Social  History Main Topics  . Smoking status: Former Smoker -- 1.00 packs/day for 2 years    Types: Cigarettes    Quit date: 07/10/2011  . Smokeless tobacco: Never Used  . Alcohol Use: No     Comment: weekly  . Drug Use: No     Comment: reports smokes "here and there"  . Sexual Activity: Yes    Birth Control/ Protection: Other-see comments     Comment: 6 weeks postpartum   Other Topics Concern  . Not on file   Social History Narrative    REVIEW OF SYSTEMS: Constitutional: No fevers, chills, or sweats, no generalized fatigue, change in appetite Eyes: No visual changes, double vision, eye pain Ear, nose and throat: No hearing loss, ear pain, nasal congestion, sore throat Cardiovascular: No chest pain, palpitations Respiratory:  No shortness of breath at  rest or with exertion, wheezes GastrointestinaI: No nausea, vomiting, diarrhea, abdominal pain, fecal incontinence Genitourinary:  No dysuria, urinary retention or frequency Musculoskeletal:  No neck pain, back pain Integumentary: No rash, pruritus, skin lesions Neurological: as above Psychiatric: No depression, insomnia, anxiety Endocrine: No palpitations, fatigue, diaphoresis, mood swings, change in appetite, change in weight, increased thirst Hematologic/Lymphatic:  No anemia, purpura, petechiae. Allergic/Immunologic: no itchy/runny eyes, nasal congestion, recent allergic reactions, rashes  PHYSICAL EXAM: Filed Vitals:   12/27/15 1406  BP: 114/80  Pulse: 86   General: No acute distress Head:  Normocephalic/atraumatic Eyes: Fundoscopic exam shows bilateral sharp discs, no vessel changes, exudates, or hemorrhages Neck: supple, no paraspinal tenderness, full range of motion. Negative Lhermitte sign Back: No paraspinal tenderness Heart: regular rate and rhythm Lungs: Clear to auscultation bilaterally. Vascular: No carotid bruits. Skin/Extremities: No rash, no edema Neurological Exam: Mental status: alert and oriented to  person, place, and time, no dysarthria or aphasia, Fund of knowledge is appropriate.  Recent and remote memory are intact.3/3 delayed recall.  Attention and concentration are normal.    Able to name objects and repeat phrases. Cranial nerves: CN I: not tested CN II: pupils equal, round and reactive to light, visual fields intact, fundi unremarkable. CN III, IV, VI:  full range of motion, no nystagmus, no ptosis CN V: facial sensation intact CN VII: upper and lower face symmetric CN VIII: hearing intact to finger rub CN IX, X: gag intact, uvula midline CN XI: sternocleidomastoid and trapezius muscles intact CN XII: tongue midline Bulk & Tone: normal, no fasciculations. Motor: 5/5 throughout with no pronator drift. Sensation: intact to light touch, cold, pin, vibration and joint position sense.  No extinction to double simultaneous stimulation.  Romberg test negative Deep Tendon Reflexes: brisk +2 throughout, no ankle clonus, negative Hoffman sign Plantar responses: downgoing bilaterally Cerebellar: no incoordination on finger to nose, heel to shin. No dysdiadochokinesia Gait: narrow-based and steady, able to tandem walk adequately. Tremor: none  IMPRESSION: This is a pleasant 35 year old right-handed woman with a history of asthma presenting with 2 fairly stereotyped episodes starting with right-sided neck tightness followed by a sensation of confusion and trouble walking. She had speech difficulties with the first episode. She also reports a few episodes of brief dizziness. The etiology of her symptoms is unclear and differential is broad, including seizure, complicated acephalgic migraines, cervicogenic headaches. Her mother has a history of MS. MRI brain with and without contrast will be ordered to assess for underlying structural abnormality. Routine EEG will be ordered as well. She will follow-up after the tests and knows to call for any changes in symptoms.   Thank you for allowing me to  participate in the care of this patient. Please do not hesitate to call for any questions or concerns.   Patrcia Dolly, M.D.  CC: Holland Commons, NP

## 2015-12-27 NOTE — Patient Instructions (Addendum)
1. Schedule MRI brain with and without contrast 2. Schedule routine EEG 3. Follow-up in 1 month, call for any changes

## 2016-01-11 ENCOUNTER — Ambulatory Visit (INDEPENDENT_AMBULATORY_CARE_PROVIDER_SITE_OTHER): Payer: Self-pay | Admitting: Neurology

## 2016-01-11 DIAGNOSIS — R42 Dizziness and giddiness: Secondary | ICD-10-CM

## 2016-01-11 DIAGNOSIS — R410 Disorientation, unspecified: Secondary | ICD-10-CM

## 2016-01-16 ENCOUNTER — Ambulatory Visit
Admission: RE | Admit: 2016-01-16 | Discharge: 2016-01-16 | Disposition: A | Discharge status: Home / Self Care | Payer: Self-pay | Source: Ambulatory Visit | Attending: Neurology | Admitting: Neurology

## 2016-01-16 DIAGNOSIS — R42 Dizziness and giddiness: Secondary | ICD-10-CM | POA: Insufficient documentation

## 2016-01-16 DIAGNOSIS — R410 Disorientation, unspecified: Secondary | ICD-10-CM | POA: Insufficient documentation

## 2016-01-16 DIAGNOSIS — Q283 Other malformations of cerebral vessels: Secondary | ICD-10-CM | POA: Insufficient documentation

## 2016-01-16 MED ORDER — GADOBENATE DIMEGLUMINE 529 MG/ML IV SOLN
20.0000 mL | Freq: Once | INTRAVENOUS | Status: AC | PRN
  Administered 2016-01-16: 19 mL via INTRAVENOUS

## 2016-01-17 NOTE — Procedures (Signed)
ELECTROENCEPHALOGRAM REPORT  Date of Study: 01/11/2016  Patient's Name: Katrina Mitchell MRN: [id number] Date of Birth: 1981/08/17  Referring Provider: Dr. Patrcia Dolly  Clinical History: This is a 35 year old woman with 2 fairly stereotyped episodes starting with right-sided neck tightness followed by a sensation of confusion and trouble walking. She had speech difficulties with the first episode. She also reports a few episodes of brief dizziness. EEG for classification  Medications: No AEDs  Technical Summary: A multichannel digital EEG recording measured by the international 10-20 system with electrodes applied with paste and impedances below 5000 ohms performed in our laboratory with EKG monitoring in an awake and asleep patient.  Hyperventilation and photic stimulation were performed.  The digital EEG was referentially recorded, reformatted, and digitally filtered in a variety of bipolar and referential montages for optimal display.    Description: The patient is awake and asleep during the recording.  During maximal wakefulness, there is a symmetric, medium voltage 9 Hz posterior dominant rhythm that attenuates with eye opening.  The record is symmetric.  During drowsiness and sleep, there is an increase in theta slowing of the background.  Vertex waves and symmetric sleep spindles were seen.  Hyperventilation and photic stimulation did not elicit any abnormalities.  There were no epileptiform discharges or electrographic seizures seen.    EKG lead was unremarkable.  Impression: This awake and asleep EEG is normal.    Clinical Correlation: A normal EEG does not exclude a clinical diagnosis of epilepsy.  If further clinical questions remain, prolonged EEG may be helpful.  Clinical correlation is advised.   Patrcia Dolly, M.D.

## 2016-01-18 ENCOUNTER — Telehealth: Payer: Self-pay | Admitting: Family Medicine

## 2016-01-18 NOTE — Telephone Encounter (Signed)
-----   Message from Van Clines, MD sent at 01/17/2016  5:22 PM EST ----- Pls let her know I reviewed MRI brain, it is unremarkable, no evidence of tumor, stroke, or bleed. Her EEG showed normal brain waves. We will discuss results further on her f/u. Thanks

## 2016-01-18 NOTE — Telephone Encounter (Signed)
Lmovm to rtn my call. 

## 2016-01-19 NOTE — Telephone Encounter (Signed)
Patient given results

## 2016-01-23 ENCOUNTER — Encounter: Payer: Self-pay | Admitting: Neurology

## 2016-01-23 ENCOUNTER — Ambulatory Visit (INDEPENDENT_AMBULATORY_CARE_PROVIDER_SITE_OTHER): Payer: Self-pay | Admitting: Neurology

## 2016-01-23 VITALS — BP 114/82 | HR 91 | Ht 66.0 in | Wt 202.0 lb

## 2016-01-23 DIAGNOSIS — R410 Disorientation, unspecified: Secondary | ICD-10-CM

## 2016-01-23 DIAGNOSIS — R42 Dizziness and giddiness: Secondary | ICD-10-CM

## 2016-01-23 DIAGNOSIS — R51 Headache: Secondary | ICD-10-CM

## 2016-01-23 DIAGNOSIS — G4486 Cervicogenic headache: Secondary | ICD-10-CM | POA: Insufficient documentation

## 2016-01-23 MED ORDER — CYCLOBENZAPRINE HCL 5 MG PO TABS: ORAL_TABLET | ORAL | Status: DC

## 2016-01-23 NOTE — Progress Notes (Signed)
NEUROLOGY FOLLOW UP OFFICE NOTE  Katrina Mitchell [id number]  HISTORY OF PRESENT ILLNESS: I had the pleasure of seeing Katrina Mitchell in follow-up in the neurology clinic on 01/23/2016.  The patient was last seen a month ago for episodes of a strange sensationg at the base of her neck, followed by confusion and dizziness. Records and images were personally reviewed where available.  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 Her routine wake and sleep EEG was normal. She denies any episodes since her last visit. She does notice more frequent headaches in the occipital region, the last one lingered for a couple of days but there was no confusion or dizziness. She denies any focal numbness/tingling/weakness, gaps in time, staring/unresponsive episodes.   HPI 12/27/15: This is a pleasant 35 yo RH woman with a history of asthma with recurrent episodes that started in September 2015, she started having a strange sensation in the back of her neck on the right side, "like a tightness," then she started feeling confused and dizzy. She described the confusion as not knowing what was going on, but she could understand people around her. Dizziness is described as trouble walking rather than true vertigo. She was unable to speak for 5-10 minutes. There was no associated headache or focal numbness/tingling/weakness. She went to [facility] ER where she reports having a head CT, report unavailable for review. She was told she may or may not have had a TIA. Symptoms cleared up in 1-2 hours. She had a similar episode last 10/30/15 while at work, she started having the same neck symptoms followed by confusion and dizziness. She went to [facility] ED, CBC and BMP were normal. I personally reviewed head CT without contrast which did not show any acute changes, there was note of progression of inflammatory  paranasal sinus disease, which may represent acute on chronic sinusitis versus progression of chronic changes. Since then, she has had 2 more dizzy episodes where she "almost felt like I would get the flu but did not," no associated neck symptoms or confusion, lasting 1-2 hours. She reports sleep is "always pretty off," sometimes she gets 7-8 hours of sleep, other times she only gets 2-3 hours, but cannot identify this as a trigger to the above episodes. She denies any olfactory/gustatory hallucinations, deja vu sensation, rising epigastric sensation, or myoclonic jerks. She reports a syncopal episode when she had a tooth abscess in January 2015, no other episodes of loss of consciousness. Her mother has MS. She had a normal birth and early development, no history of febrile convulsions, CNS infections, significant head injuries, neurosurgical procedures, or family history of seizures.   PAST MEDICAL HISTORY: Past Medical History  Diagnosis Date  . Asthma   . Pregnancy as incidental finding   . Complication of anesthesia   . GERD (gastroesophageal reflux disease)     MEDICATIONS: Current Outpatient Prescriptions on File Prior to Visit  Medication Sig Dispense Refill  . acetaminophen (TYLENOL) 325 MG tablet Uses as needed for pain    . cetirizine (ZYRTEC) 10 MG tablet Take 10 mg by mouth daily.    Marland Kitchen esomeprazole (NEXIUM) 20 MG capsule Take 20 mg by mouth daily at 12 noon.    . famotidine (PEPCID) 20 MG tablet Take 1 tablet (20 mg total) by mouth at bedtime.    . fexofenadine (ALLEGRA) 60 MG tablet Take 60 mg by mouth  2 (two) times daily.    Marland Kitchen ibuprofen (ADVIL,MOTRIN) 200 MG tablet Per bottle    . mometasone-formoterol (DULERA) 200-5 MCG/ACT AERO Inhale 2 puffs into the lungs 2 (two) times daily. 3 Inhaler 3  . triamcinolone cream (KENALOG) 0.1 % Apply 1 application topically 2 (two) times daily. 30 g 1  . albuterol (PROVENTIL HFA;VENTOLIN HFA) 108 (90 BASE) MCG/ACT inhaler Inhale 1-2 puffs into  the lungs every 6 (six) hours as needed for wheezing. (Patient not taking: Reported on 12/27/2015) 3 Inhaler 3  . albuterol (PROVENTIL) (2.5 MG/3ML) 0.083% nebulizer solution Take 3 mLs (2.5 mg total) by nebulization every 6 (six) hours as needed for wheezing. (Patient not taking: Reported on 12/27/2015) 75 mL 3  . ipratropium (ATROVENT) 0.02 % nebulizer solution Take 2.5 mLs (0.5 mg total) by nebulization 4 (four) times daily. (Patient not taking: Reported on 12/27/2015) 30 mL 2   No current facility-administered medications on file prior to visit.    ALLERGIES: No Known Allergies  FAMILY HISTORY: Family History  Problem Relation Age of Onset  . Heart disease Paternal Grandfather   . Cancer Maternal Grandmother     SOCIAL HISTORY: Social History   Social History  . Marital Status: Divorced    Spouse Name: N/A  . Number of Children: 3  . Years of Education: N/A   Occupational History  . Family [employer]    Social History Main Topics  . Smoking status: Former Smoker -- 1.00 packs/day for 2 years    Types: Cigarettes    Quit date: 07/10/2011  . Smokeless tobacco: Never Used  . Alcohol Use: 0.0 oz/week    0 Standard drinks or equivalent per week     Comment: Occ  . Drug Use: No     Comment: reports smokes "here and there"  . Sexual Activity: Yes    Birth Control/ Protection: Other-see comments     Comment: 6 weeks postpartum   Other Topics Concern  . Not on file   Social History Narrative    REVIEW OF SYSTEMS: Constitutional: No fevers, chills, or sweats, no generalized fatigue, change in appetite Eyes: No visual changes, double vision, eye pain Ear, nose and throat: No hearing loss, ear pain, nasal congestion, sore throat Cardiovascular: No chest pain, palpitations Respiratory:  No shortness of breath at rest or with exertion, wheezes GastrointestinaI: No nausea, vomiting, diarrhea, abdominal pain, fecal incontinence Genitourinary:  No dysuria, urinary retention or  frequency Musculoskeletal:  No neck pain, back pain Integumentary: No rash, pruritus, skin lesions Neurological: as above Psychiatric: No depression, insomnia, anxiety Endocrine: No palpitations, fatigue, diaphoresis, mood swings, change in appetite, change in weight, increased thirst Hematologic/Lymphatic:  No anemia, purpura, petechiae. Allergic/Immunologic: no itchy/runny eyes, nasal congestion, recent allergic reactions, rashes  PHYSICAL EXAM: Filed Vitals:   01/23/16 1337  BP: 114/82  Pulse: 91   General: No acute distress Head:  Normocephalic/atraumatic Neck: supple, no paraspinal tenderness, full range of motion Heart:  Regular rate and rhythm Lungs:  Clear to auscultation bilaterally Back: No paraspinal tenderness Skin/Extremities: No rash, no edema Neurological Exam: alert and oriented to person, place, and time. No aphasia or dysarthria. Fund of knowledge is appropriate.  Recent and remote memory are intact.  Attention and concentration are normal.    Able to name objects and repeat phrases. Cranial nerves: Pupils equal, round, reactive to light.  Extraocular movements intact with no nystagmus. Visual fields full. Facial sensation intact. No facial asymmetry. Tongue, uvula, palate midline.  Motor: Bulk and  tone normal, muscle strength 5/5 throughout with no pronator drift.  Sensation to light touch intact.  No extinction to double simultaneous stimulation.  Deep tendon reflexes 2+ throughout, toes downgoing.  Finger to nose testing intact.  Gait narrow-based and steady, able to tandem walk adequately.  Romberg negative.  IMPRESSION: This is a pleasant 35 yo RH woman with a history of asthma who presented with 2 fairly stereotyped episodes starting with right-sided neck tightness followed by a sensation of confusion and trouble walking. She had speech difficulties with the first episode. She also reports a few episodes of brief dizziness. MRI brain and routine EEG normal. No  symptoms in the past month. The etiology of her symptoms is unclear and differentials continue to be cervicogenic headaches, complicated acephalgic migraines, seizures. We discussed that if symptoms increase in frequency, a 48-hour EEG will be ordered to further classify these. We also discussed the possibility of cervicogenic headaches, and option for starting a daily headache preventative medication versus rescue medication. She would like to do prn medication for now at the onset of symptoms. She will try taking 1/2 tablet of Flexeril 5mg  at the onset of neck pain and assess response. She will keep a calendar of symptoms, if headaches increase in frequency, would consider starting nortriptyline. She will follow-up in 2 months and knows to call for any changes in symptoms.   Thank you for allowing me to participate in her care.  Please do not hesitate to call for any questions or concerns.  The duration of this appointment visit was 25 minutes of face-to-face time with the patient.  Greater than 50% of this time was spent in counseling, explanation of diagnosis, planning of further management, and coordination of care.   Patrcia Dolly, M.D.   CC: Holland Commons, NP

## 2016-01-23 NOTE — Patient Instructions (Signed)
1. Take Flexeril  1/2 tablet at onset of neck pain/headache. Do not take more than 3 a week 2. Keep a calendar of your symptoms 3. Follow-up in 2 months, call for any changes

## 2016-01-24 MED FILL — CYCLOBENZAPRINE 5 MG TABLET: 5 | 30 days supply | Qty: 10 | Fill #0

## 2016-01-25 MED FILL — $PROVENTIL HFA 90 MCG INHAL: 108 (90 BAS | 30 days supply | Qty: 1 | Fill #7

## 2016-01-25 MED FILL — $Dulera 200mcg/5 mcg Inh: 200-5 | 26 days supply | Qty: 13 | Fill #5

## 2016-02-25 IMAGING — CR DG CHEST 2V
1 series · 2 of 2 positions shown · non-contrast
Comparison: 12/24/2013 and earlier.

CLINICAL DATA: 34-year-old female with shortness of breath. Asthma
attack this weekend. Initial encounter.

EXAM:
CHEST  2 VIEW

[Series 1: dg chest 2 view · 0.14mm/px · 2 of 2 slices shown]
[im 1/2]
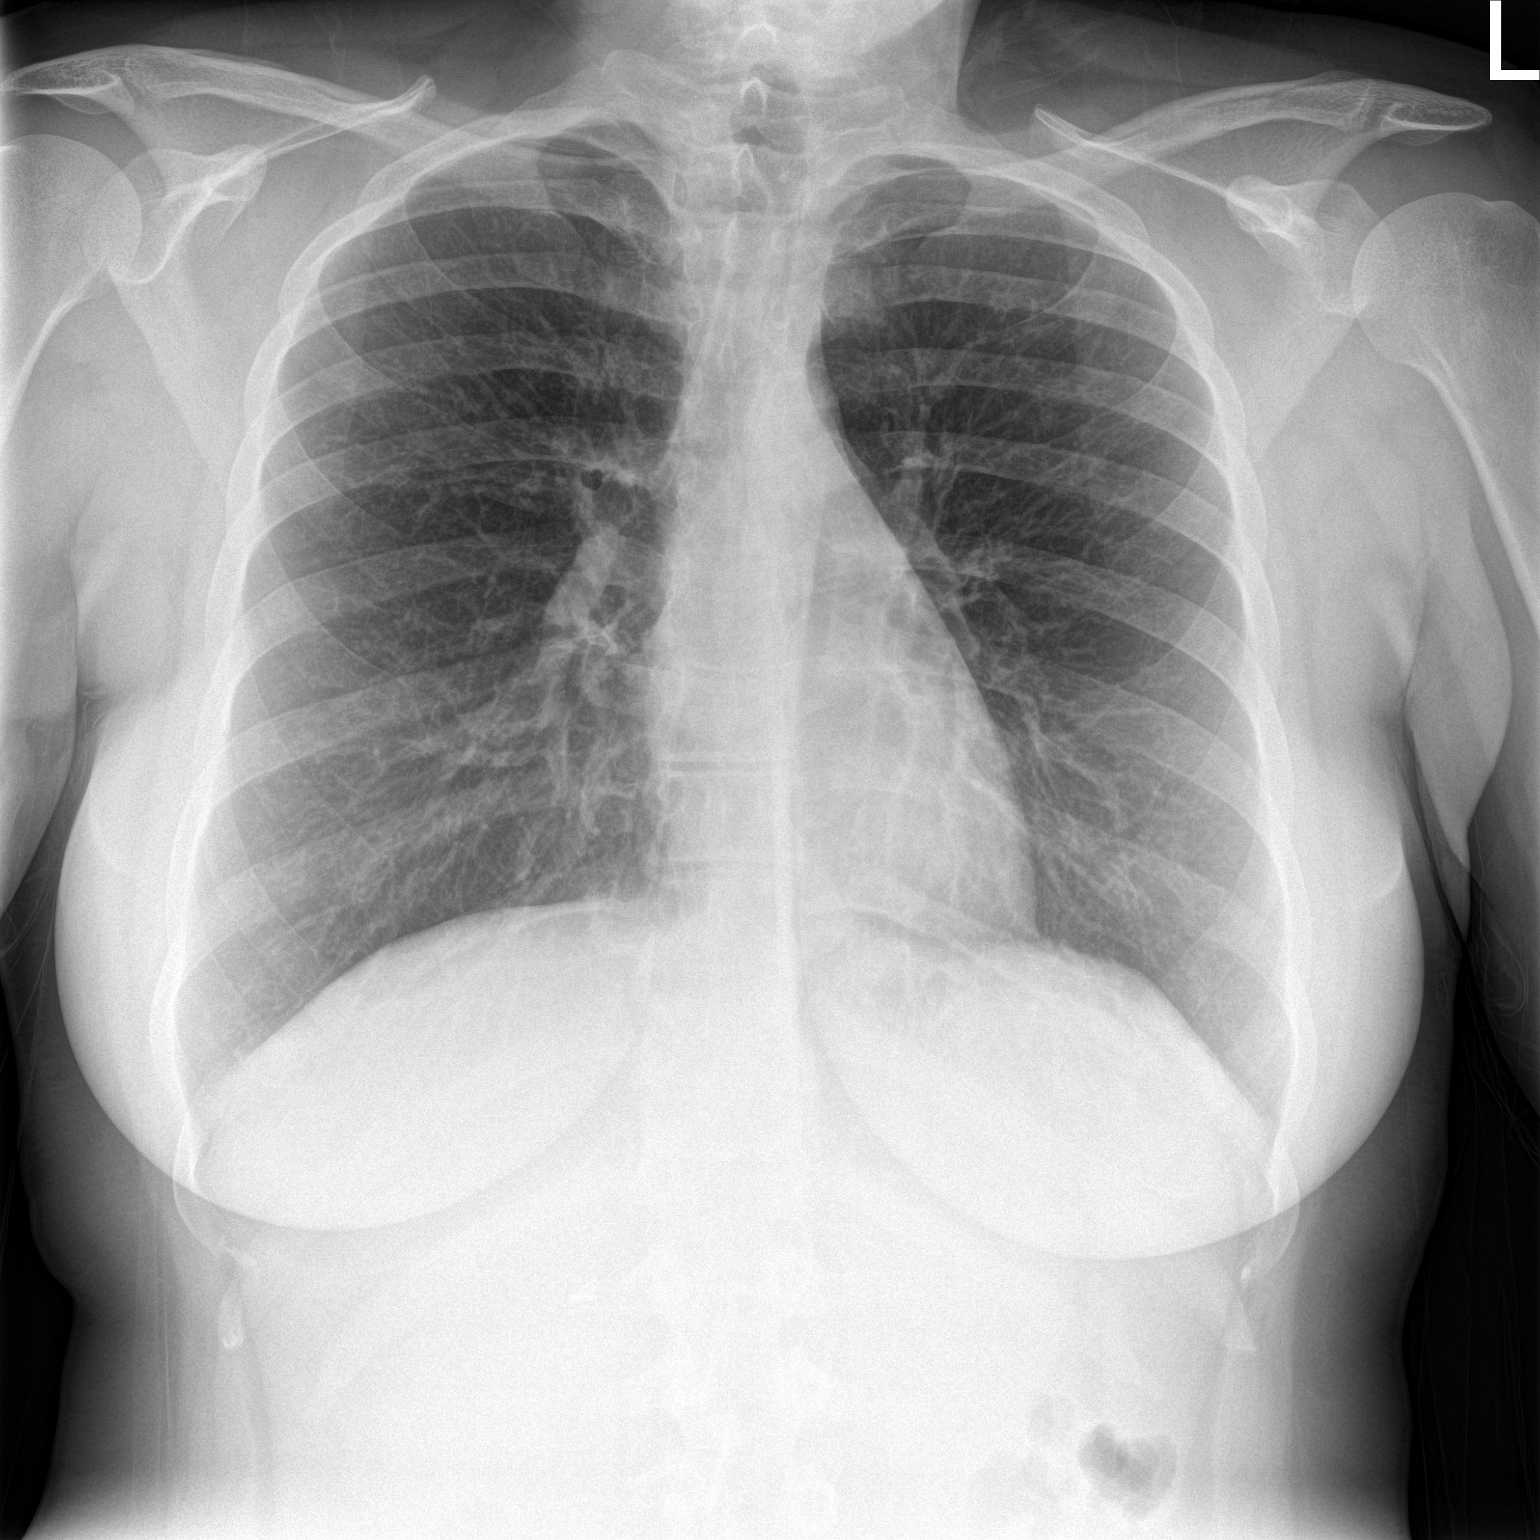
[im 2/2]
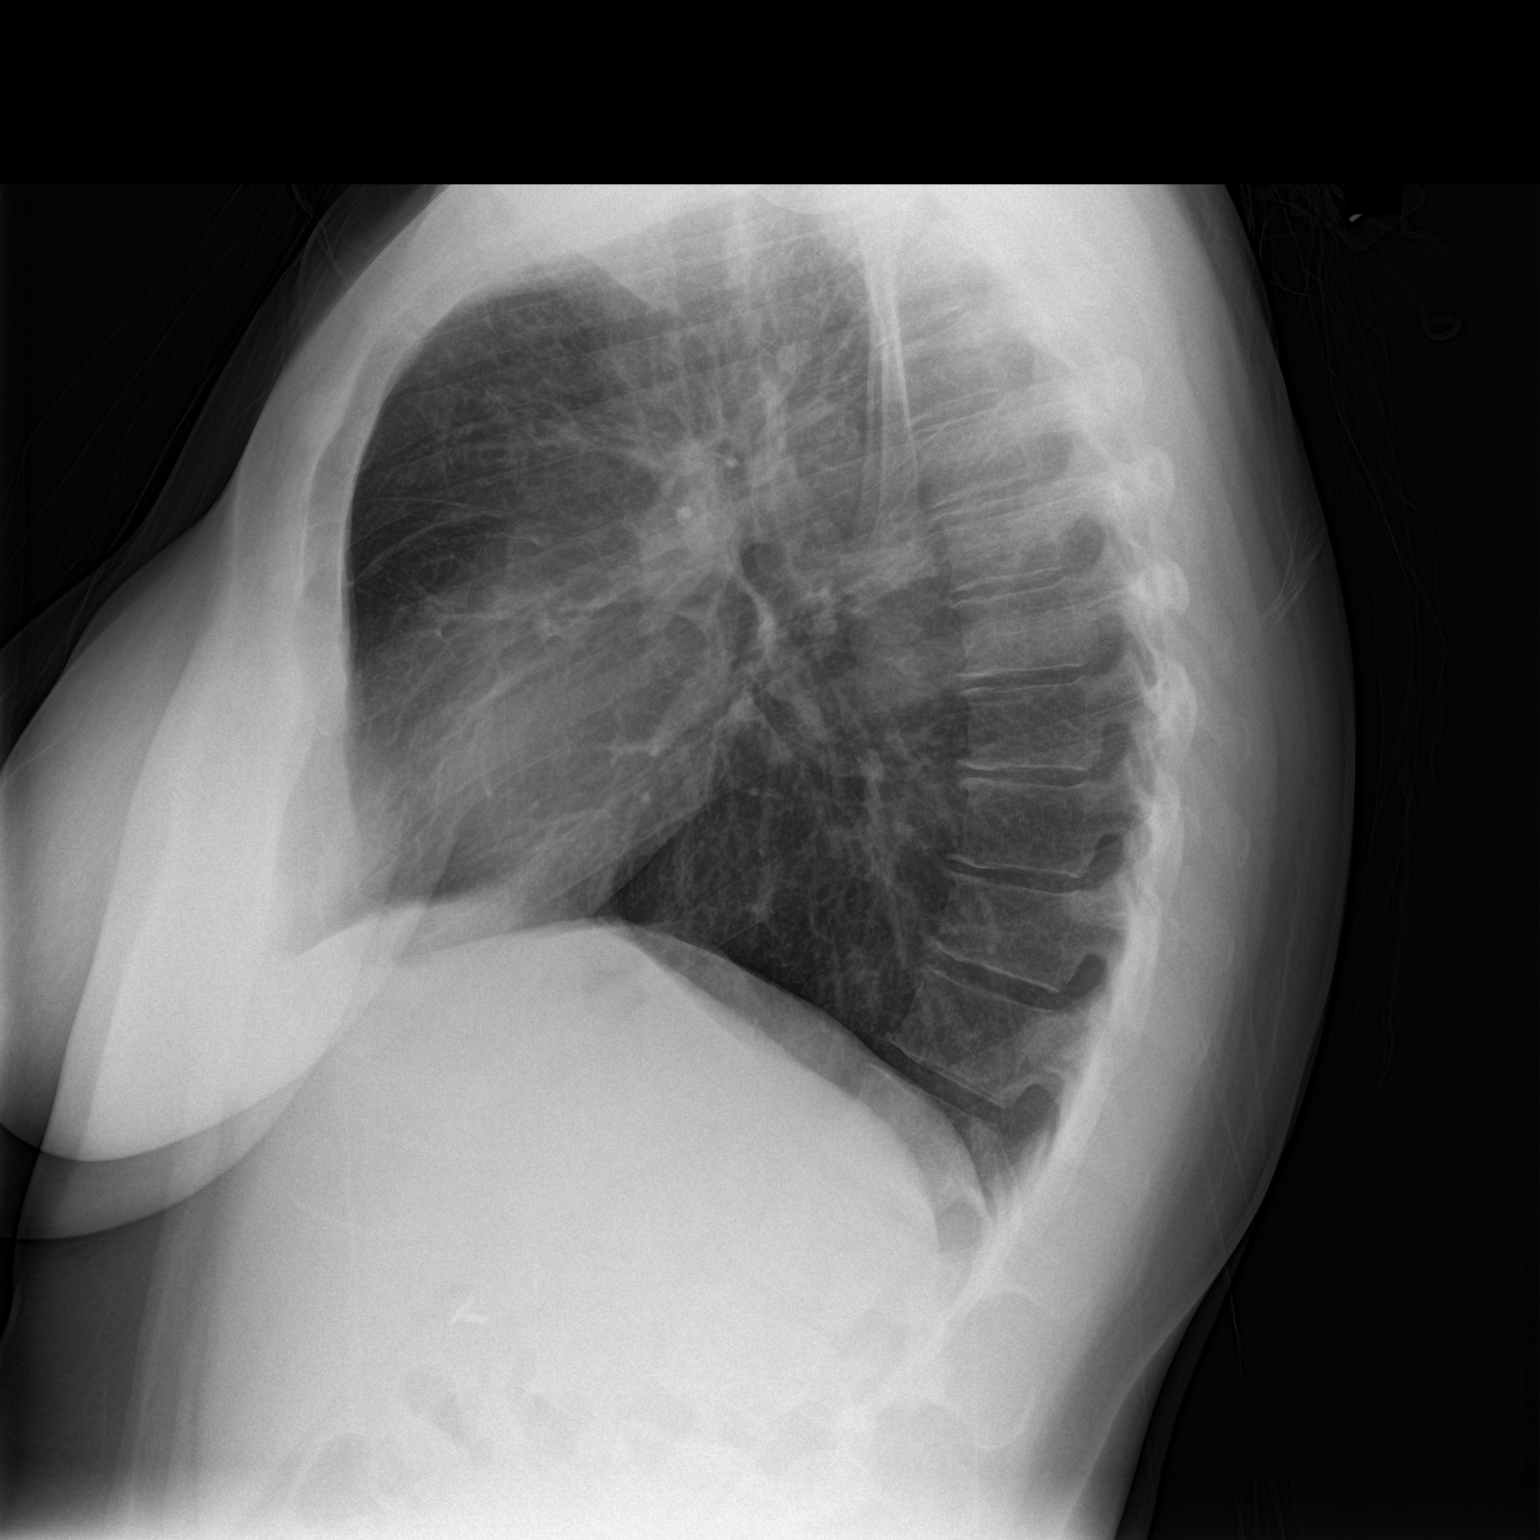

[2 of 2 positions shown; findings below may reference images not displayed]

FINDINGS: Normal cardiac size and mediastinal contours. Stable large lung
volumes with increased AP dimension to the chest. Chronic perihilar
interstitial markings are stable. No pneumothorax, pulmonary edema,
pleural effusion or acute pulmonary opacity. Cholecystectomy
surgical clips. No acute osseous abnormality identified.
IMPRESSION: Chronic hyperinflation and peribronchial thickening. No acute
cardiopulmonary abnormality.

## 2016-03-19 ENCOUNTER — Ambulatory Visit: Payer: Self-pay | Admitting: Neurology

## 2016-03-28 MED FILL — $PROVENTIL HFA 90 MCG INHAL: 108 (90 BAS | 30 days supply | Qty: 1 | Fill #8

## 2016-03-28 MED FILL — $Dulera 200mcg/5 mcg Inh: 200-5 | 26 days supply | Qty: 13 | Fill #6

## 2016-05-24 MED FILL — $PROVENTIL HFA 90 MCG INHAL: 108 (90 BAS | 29 days supply | Qty: 1 | Fill #0

## 2016-05-24 MED FILL — $Dulera 200mcg/5 mcg Inh: 200-5 | 26 days supply | Qty: 13 | Fill #7

## 2016-06-22 ENCOUNTER — Emergency Department: Payer: Self-pay

## 2016-06-22 ENCOUNTER — Encounter: Payer: Self-pay | Admitting: Emergency Medicine

## 2016-06-22 ENCOUNTER — Emergency Department
Admission: EM | Admit: 2016-06-22 | Discharge: 2016-06-22 | Disposition: A | Discharge status: Home / Self Care | Payer: Self-pay | Attending: Emergency Medicine | Admitting: Emergency Medicine

## 2016-06-22 DIAGNOSIS — H6992 Unspecified Eustachian tube disorder, left ear: Secondary | ICD-10-CM

## 2016-06-22 DIAGNOSIS — H65112 Acute and subacute allergic otitis media (mucoid) (sanguinous) (serous), left ear: Secondary | ICD-10-CM

## 2016-06-22 DIAGNOSIS — J019 Acute sinusitis, unspecified: Secondary | ICD-10-CM | POA: Insufficient documentation

## 2016-06-22 DIAGNOSIS — Z87891 Personal history of nicotine dependence: Secondary | ICD-10-CM | POA: Insufficient documentation

## 2016-06-22 DIAGNOSIS — B9689 Other specified bacterial agents as the cause of diseases classified elsewhere: Secondary | ICD-10-CM

## 2016-06-22 DIAGNOSIS — H6982 Other specified disorders of Eustachian tube, left ear: Secondary | ICD-10-CM

## 2016-06-22 DIAGNOSIS — Z79899 Other long term (current) drug therapy: Secondary | ICD-10-CM | POA: Insufficient documentation

## 2016-06-22 DIAGNOSIS — J4520 Mild intermittent asthma, uncomplicated: Secondary | ICD-10-CM

## 2016-06-22 DIAGNOSIS — Z7951 Long term (current) use of inhaled steroids: Secondary | ICD-10-CM | POA: Insufficient documentation

## 2016-06-22 DIAGNOSIS — H65192 Other acute nonsuppurative otitis media, left ear: Secondary | ICD-10-CM

## 2016-06-22 MED ORDER — FLUTICASONE PROPIONATE 50 MCG/ACT NA SUSP: 1.0000 | Freq: Two times a day (BID) | NASAL | Status: DC

## 2016-06-22 MED ORDER — AMOXICILLIN-POT CLAVULANATE 875-125 MG PO TABS: 1.0000 | ORAL_TABLET | Freq: Two times a day (BID) | ORAL | Status: DC

## 2016-06-22 MED ORDER — PREDNISONE 50 MG PO TABS: 50.0000 mg | ORAL_TABLET | Freq: Every day | ORAL | Status: DC

## 2016-06-22 NOTE — ED Notes (Signed)
Sinus congestion with yellow drainage x 3 weeks, cough with chest burning with inspiration and green phlegm began yesterday.

## 2016-06-22 NOTE — ED Notes (Signed)

## 2016-06-22 NOTE — Discharge Instructions (Signed)
Sinusitis, Adult °Sinusitis is redness, soreness, and inflammation of the paranasal sinuses. Paranasal sinuses are air pockets within the bones of your face. They are located beneath your eyes, in the middle of your forehead, and above your eyes. In healthy paranasal sinuses, mucus is able to drain out, and air is able to circulate through them by way of your nose. However, when your paranasal sinuses are inflamed, mucus and air can become trapped. This can allow bacteria and other germs to grow and cause infection. °Sinusitis can develop quickly and last only a short time (acute) or continue over a long period (chronic). Sinusitis that lasts for more than 12 weeks is considered chronic. °CAUSES °Causes of sinusitis include: °· Allergies. °· Structural abnormalities, such as displacement of the cartilage that separates your nostrils (deviated septum), which can decrease the air flow through your nose and sinuses and affect sinus drainage. °· Functional abnormalities, such as when the small hairs (cilia) that line your sinuses and help remove mucus do not work properly or are not present. °SIGNS AND SYMPTOMS °Symptoms of acute and chronic sinusitis are the same. The primary symptoms are pain and pressure around the affected sinuses. Other symptoms include: °· Upper toothache. °· Earache. °· Headache. °· Bad breath. °· Decreased sense of smell and taste. °· A cough, which worsens when you are lying flat. °· Fatigue. °· Fever. °· Thick drainage from your nose, which often is green and may contain pus (purulent). °· Swelling and warmth over the affected sinuses. °DIAGNOSIS °Your health care provider will perform a physical exam. During your exam, your health care provider may perform any of the following to help determine if you have acute sinusitis or chronic sinusitis: °· Look in your nose for signs of abnormal growths in your nostrils (nasal polyps). °· Tap over the affected sinus to check for signs of  infection. °· View the inside of your sinuses using an imaging device that has a light attached (endoscope). °If your health care provider suspects that you have chronic sinusitis, one or more of the following tests may be recommended: °· Allergy tests. °· Nasal culture. A sample of mucus is taken from your nose, sent to a lab, and screened for bacteria. °· Nasal cytology. A sample of mucus is taken from your nose and examined by your health care provider to determine if your sinusitis is related to an allergy. °TREATMENT °Most cases of acute sinusitis are related to a viral infection and will resolve on their own within 10 days. Sometimes, medicines are prescribed to help relieve symptoms of both acute and chronic sinusitis. These may include pain medicines, decongestants, nasal steroid sprays, or saline sprays. °However, for sinusitis related to a bacterial infection, your health care provider will prescribe antibiotic medicines. These are medicines that will help kill the bacteria causing the infection. °Rarely, sinusitis is caused by a fungal infection. In these cases, your health care provider will prescribe antifungal medicine. °For some cases of chronic sinusitis, surgery is needed. Generally, these are cases in which sinusitis recurs more than 3 times per year, despite other treatments. °HOME CARE INSTRUCTIONS °· Drink plenty of water. Water helps thin the mucus so your sinuses can drain more easily. °· Use a humidifier. °· Inhale steam 3-4 times a day (for example, sit in the bathroom with the shower running). °· Apply a warm, moist washcloth to your face 3-4 times a day, or as directed by your health care provider. °· Use saline nasal sprays to help   moisten and clean your sinuses. °· Take medicines only as directed by your health care provider. °· If you were prescribed either an antibiotic or antifungal medicine, finish it all even if you start to feel better. °SEEK IMMEDIATE MEDICAL CARE IF: °· You have  increasing pain or severe headaches. °· You have nausea, vomiting, or drowsiness. °· You have swelling around your face. °· You have vision problems. °· You have a stiff neck. °· You have difficulty breathing. °  °This information is not intended to replace advice given to you by your health care provider. Make sure you discuss any questions you have with your health care provider. °  °Document Released: 11/25/2005 Document Revised: 12/16/2014 Document Reviewed: 12/10/2011 °[employer]vier Interactive Patient Education ©2016 [employer] ° °Otitis Media, Adult °Otitis media is redness, soreness, and inflammation of the middle ear. Otitis media may be caused by allergies or, most commonly, by infection. Often it occurs as a complication of the common cold. °SIGNS AND SYMPTOMS °Symptoms of otitis media may include: °· Earache. °· Fever. °· Ringing in your ear. °· Headache. °· Leakage of fluid from the ear. °DIAGNOSIS °To diagnose otitis media, your health care provider will examine your ear with an otoscope. This is an instrument that allows your health care provider to see into your ear in order to examine your eardrum. Your health care provider also will ask you questions about your symptoms. °TREATMENT  °Typically, otitis media resolves on its own within 3-5 days. Your health care provider may prescribe medicine to ease your symptoms of pain. If otitis media does not resolve within 5 days or is recurrent, your health care provider may prescribe antibiotic medicines if he or she suspects that a bacterial infection is the cause. °HOME CARE INSTRUCTIONS  °· If you were prescribed an antibiotic medicine, finish it all even if you start to feel better. °· Take medicines only as directed by your health care provider. °· Keep all follow-up visits as directed by your health care provider. °SEEK MEDICAL CARE IF: °· You have otitis media only in one ear, or bleeding from your nose, or both. °· You notice a lump on your  neck. °· You are not getting better in 3-5 days. °· You feel worse instead of better. °SEEK IMMEDIATE MEDICAL CARE IF:  °· You have pain that is not controlled with medicine. °· You have swelling, redness, or pain around your ear or stiffness in your neck. °· You notice that part of your face is paralyzed. °· You notice that the bone behind your ear (mastoid) is tender when you touch it. °MAKE SURE YOU:  °· Understand these instructions. °· Will watch your condition. °· Will get help right away if you are not doing well or get worse. °  °This information is not intended to replace advice given to you by your health care provider. Make sure you discuss any questions you have with your health care provider. °  °Document Released: 08/30/2004 Document Revised: 12/16/2014 Document Reviewed: 06/22/2013 °Elsevier Interactive Patient Education ©2016 [employer] ° °

## 2016-06-22 NOTE — ED Provider Notes (Signed)
[facility] Emergency Department Provider Note  ____________________________________________  Time seen: Approximately 6:02 PM  I have reviewed the triage vital signs and the nursing notes.   HISTORY  Chief Complaint Nasal Congestion and Cough    HPI Katrina Mitchell is a 35 y.o. female who presents to emergency part complaining of nasal congestion, sinus pressure, left ear pain, scratchy throat, cough. Patient states that symptoms began with nasal congestion and progressed to include the rest of the symptoms. Patient reports having some mild productive coughing yesterday. She denies any fevers or chills, headache, visual changes, difficulty breathing or swallowing. Patient has a history of asthma but states that she has not had any wheezing or difficulty breathing. Patient has been using her inhaler every 4-6 hours over the past several days.   Past Medical History  Diagnosis Date  . Asthma   . Pregnancy as incidental finding   . Complication of anesthesia   . GERD (gastroesophageal reflux disease)     Patient Active Problem List   Diagnosis Date Noted  . Cervicogenic headache 01/23/2016  . Dizziness 12/27/2015  . Episodic confusion 12/27/2015  . Moderate persistent asthma, high risk. Status post intubation December 2013 01/14/2013  . Status asthmaticus 12/06/2012  . Sterilization 07/01/2012    Past Surgical History  Procedure Laterality Date  . Nasal sinus surgery    . Cholecystectomy  2001  . Lypoma removal  2003    right arm   . Laparoscopic tubal ligation  08/19/2012    Procedure: LAPAROSCOPIC TUBAL LIGATION;  Surgeon: Lazaro ArmsLuther H Eure, MD;  Location: [facility];  Service: Gynecology;  Laterality: Bilateral;    Current Outpatient Rx  Name  Route  Sig  Dispense  Refill  . acetaminophen (TYLENOL) 325 MG tablet      Uses as needed for pain         . albuterol (PROVENTIL HFA;VENTOLIN HFA) 108 (90 BASE) MCG/ACT inhaler   Inhalation   Inhale 1-2  puffs into the lungs every 6 (six) hours as needed for wheezing. Patient not taking: Reported on 12/27/2015   3 Inhaler   3   . albuterol (PROVENTIL) (2.5 MG/3ML) 0.083% nebulizer solution   Nebulization   Take 3 mLs (2.5 mg total) by nebulization every 6 (six) hours as needed for wheezing. Patient not taking: Reported on 12/27/2015   75 mL   3   . amoxicillin-clavulanate (AUGMENTIN) 875-125 MG tablet   Oral   Take 1 tablet by mouth 2 (two) times daily.   14 tablet   0   . cetirizine (ZYRTEC) 10 MG tablet   Oral   Take 10 mg by mouth daily.         . cyclobenzaprine (FLEXERIL) 5 MG tablet      Take 1/2 tablet as needed at onset of neck pain/headache. Do not take more than 3 a week   10 tablet   6   . esomeprazole (NEXIUM) 20 MG capsule   Oral   Take 20 mg by mouth daily at 12 noon.         . famotidine (PEPCID) 20 MG tablet   Oral   Take 1 tablet (20 mg total) by mouth at bedtime.         . fexofenadine (ALLEGRA) 60 MG tablet   Oral   Take 60 mg by mouth 2 (two) times daily.         . fluticasone (FLONASE) 50 MCG/ACT nasal spray   Each Nare  Place 1 spray into both nostrils 2 (two) times daily.   16 g   0   . ibuprofen (ADVIL,MOTRIN) 200 MG tablet      Per bottle         . ipratropium (ATROVENT) 0.02 % nebulizer solution   Nebulization   Take 2.5 mLs (0.5 mg total) by nebulization 4 (four) times daily. Patient not taking: Reported on 12/27/2015   30 mL   2   . mometasone-formoterol (DULERA) 200-5 MCG/ACT AERO   Inhalation   Inhale 2 puffs into the lungs 2 (two) times daily.   3 Inhaler   3   . predniSONE (DELTASONE) 50 MG tablet   Oral   Take 1 tablet (50 mg total) by mouth daily with breakfast.   5 tablet   0   . triamcinolone cream (KENALOG) 0.1 %   Topical   Apply 1 application topically 2 (two) times daily.   30 g   1     Allergies Review of patient's allergies indicates no known allergies.  Family History  Problem Relation  Age of Onset  . Heart disease Paternal Grandfather   . Cancer Maternal Grandmother     Social History Social History  Substance Use Topics  . Smoking status: Former Smoker -- 1.00 packs/day for 2 years    Types: Cigarettes    Quit date: 07/10/2011  . Smokeless tobacco: Never Used  . Alcohol Use: 0.0 oz/week    0 Standard drinks or equivalent per week     Comment: Occ     Review of Systems  Constitutional: No fever/chills Eyes: No visual changes.  ENT: Positive for nasal congestion, left ear pain, scratchy throat. Cardiovascular: no chest pain. Respiratory: Positive cough. No SOB. Gastrointestinal: No abdominal pain.  No nausea, no vomiting.  No diarrhea.  No constipation. Musculoskeletal: Negative for musculoskeletal pain. Skin: Negative for rash, abrasions, lacerations, ecchymosis. Neurological: Negative for headaches, focal weakness or numbness. 10-point ROS otherwise negative.  ____________________________________________   PHYSICAL EXAM:  VITAL SIGNS: ED Triage Vitals  Enc Vitals Group     BP 06/22/16 1605 138/83 mmHg     Pulse Rate 06/22/16 1605 91     Resp 06/22/16 1605 20     Temp 06/22/16 1605 98.1 F (36.7 C)     Temp Source 06/22/16 1605 Oral     SpO2 06/22/16 1605 98 %     Weight 06/22/16 1605 190 lb (86.183 kg)     Height 06/22/16 1605 5\' 7"  (1.702 m)     Head Cir --      Peak Flow --      Pain Score 06/22/16 1606 5     Pain Loc --      Pain Edu? --      Excl. in GC? --      Constitutional: Alert and oriented. Well appearing and in no acute distress. Eyes: Conjunctivae are normal. PERRL. EOMI. Head: Atraumatic. ENT:      Ears: EACs unremarkable bilaterally. TM on left side is erythematous, bulging, with mucoid air-fluid level.      Nose: Moderate purulent congestion/rhinnorhea. Turbinates are erythematous and edematous. Patient is tender to percussion over the frontal and maxillary sinuses.      Mouth/Throat: Mucous membranes are moist.  Pharynx is mildly erythematous but nonedematous. Uvula is midline. Tonsils are mildly erythematous but nonedematous and no exudates are present. Neck: No stridor. Neck is supple with full range of motion Hematological/Lymphatic/Immunilogical: No cervical lymphadenopathy. Cardiovascular: Normal rate, regular  rhythm. Normal S1 and S2.  Good peripheral circulation. Respiratory: Normal respiratory effort without tachypnea or retractions. Lungs CTAB. No wheezing is appreciated. Good air entry to the bases with no decreased or absent breath sounds. Musculoskeletal: Full range of motion to all extremities. No gross deformities appreciated. Neurologic:  Normal speech and language. No gross focal neurologic deficits are appreciated.  Skin:  Skin is warm, dry and intact. No rash noted. Psychiatric: Mood and affect are normal. Speech and behavior are normal. Patient exhibits appropriate insight and judgement.   ____________________________________________   LABS (all labs ordered are listed, but only abnormal results are displayed)  Labs Reviewed - No data to display ____________________________________________  EKG   ____________________________________________  RADIOLOGY Festus Barren Cuthriell, personally viewed and evaluated these images (plain radiographs) as part of my medical decision making, as well as reviewing the written report by the radiologist.  Dg Chest 2 View  06/22/2016  CLINICAL DATA:  Sinus congestion, yellow drainage for 3 weeks, cough EXAM: CHEST  2 VIEW COMPARISON:  04/18/2015 FINDINGS: The heart size and mediastinal contours are within normal limits. Both lungs are clear. The visualized skeletal structures are unremarkable. IMPRESSION: No active cardiopulmonary disease. Electronically Signed   By: Elige Ko   On: 06/22/2016 16:29    ____________________________________________    PROCEDURES  Procedure(s) performed:       Medications - No data to  display   ____________________________________________   INITIAL IMPRESSION / ASSESSMENT AND PLAN / ED COURSE  Pertinent labs & imaging results that were available during my care of the patient were reviewed by me and considered in my medical decision making (see chart for details).  Patient's diagnosis is consistent with acute bacterial sinusitis and left-sided otitis media. Patient's symptoms began with sinus congestion and progressed to include the rest. Patient does have a history of asthma and while she states that she has been using her albuterol she denies any wheezing, shortness of breath, difficulty breathing. Exam is reassuring. X-ray reveals no indication of pneumonia, bronchitis. Patient will be discharged home with prescriptions for antibiotics and Flonase. Patient will be given a prescription for steroid taper to be used if she develops wheezing from her asthma.. Patient is to follow up with primary care provider as needed or otherwise directed. Patient is given ED precautions to return to the ED for any worsening or new symptoms.     ____________________________________________  FINAL CLINICAL IMPRESSION(S) / ED DIAGNOSES  Final diagnoses:  Acute bacterial sinusitis  Eustachian tube dysfunction, left  Acute mucoid otitis media of left ear  Asthma, mild intermittent, uncomplicated      NEW MEDICATIONS STARTED DURING THIS VISIT:  New Prescriptions   AMOXICILLIN-CLAVULANATE (AUGMENTIN) 875-125 MG TABLET    Take 1 tablet by mouth 2 (two) times daily.   FLUTICASONE (FLONASE) 50 MCG/ACT NASAL SPRAY    Place 1 spray into both nostrils 2 (two) times daily.   PREDNISONE (DELTASONE) 50 MG TABLET    Take 1 tablet (50 mg total) by mouth daily with breakfast.        This chart was dictated using voice recognition software/Dragon. Despite best efforts to proofread, errors can occur which can change the meaning. Any change was purely unintentional.    Racheal Patches, PA-C 06/22/16 1815  Myrna Blazer, MD 06/23/16 0040

## 2016-07-15 ENCOUNTER — Other Ambulatory Visit: Payer: Self-pay | Admitting: Internal Medicine

## 2016-07-15 DIAGNOSIS — J4540 Moderate persistent asthma, uncomplicated: Secondary | ICD-10-CM

## 2016-07-15 MED FILL — **DULERA 200 MCG/5 MCG INHA: 200-5 MCG | 12 days supply | Qty: 8 | Fill #0

## 2016-07-30 ENCOUNTER — Ambulatory Visit: Payer: Self-pay

## 2016-08-06 ENCOUNTER — Ambulatory Visit: Payer: Self-pay | Attending: Internal Medicine

## 2016-08-06 ENCOUNTER — Other Ambulatory Visit: Payer: Self-pay | Admitting: Pharmacist

## 2016-08-06 DIAGNOSIS — J4540 Moderate persistent asthma, uncomplicated: Secondary | ICD-10-CM

## 2016-08-06 MED ORDER — MOMETASONE FURO-FORMOTEROL FUM 200-5 MCG/ACT IN AERO: 2.0000 | INHALATION_SPRAY | Freq: Two times a day (BID) | RESPIRATORY_TRACT | 0 refills | Status: DC

## 2016-08-06 MED FILL — DULERA 200 MCG/5 MCG INH: 200-5 | 30 days supply | Qty: 13 | Fill #0

## 2016-08-06 MED FILL — TRIAMCINOLONE 0.1% CREAM: 0.1 | 15 days supply | Qty: 30 | Fill #1

## 2016-09-23 ENCOUNTER — Encounter: Payer: Self-pay | Admitting: Physician Assistant

## 2016-09-23 ENCOUNTER — Ambulatory Visit: Payer: Self-pay | Attending: Internal Medicine | Admitting: Physician Assistant

## 2016-09-23 VITALS — BP 111/76 | HR 84 | Temp 98.6°F | Resp 16 | Wt 215.0 lb

## 2016-09-23 DIAGNOSIS — Z131 Encounter for screening for diabetes mellitus: Secondary | ICD-10-CM | POA: Insufficient documentation

## 2016-09-23 DIAGNOSIS — Z79899 Other long term (current) drug therapy: Secondary | ICD-10-CM | POA: Insufficient documentation

## 2016-09-23 DIAGNOSIS — J4540 Moderate persistent asthma, uncomplicated: Secondary | ICD-10-CM | POA: Insufficient documentation

## 2016-09-23 DIAGNOSIS — Z23 Encounter for immunization: Secondary | ICD-10-CM | POA: Insufficient documentation

## 2016-09-23 LAB — GLUCOSE, POCT (MANUAL RESULT ENTRY): POC GLUCOSE: 119 mg/dL — AB (ref 70–99)

## 2016-09-23 MED ORDER — FLUTICASONE PROPIONATE 50 MCG/ACT NA SUSP: 1.0000 | Freq: Two times a day (BID) | NASAL | 3 refills | Status: DC

## 2016-09-23 MED ORDER — MOMETASONE FURO-FORMOTEROL FUM 200-5 MCG/ACT IN AERO: 2.0000 | INHALATION_SPRAY | Freq: Two times a day (BID) | RESPIRATORY_TRACT | 7 refills | Status: DC

## 2016-09-23 MED ORDER — ALBUTEROL SULFATE HFA 108 (90 BASE) MCG/ACT IN AERS: 1.0000 | INHALATION_SPRAY | Freq: Four times a day (QID) | RESPIRATORY_TRACT | 3 refills | Status: DC | PRN

## 2016-09-23 MED ORDER — IPRATROPIUM BROMIDE 0.02 % IN SOLN: 0.5000 mg | Freq: Four times a day (QID) | RESPIRATORY_TRACT | 2 refills | Status: DC

## 2016-09-23 NOTE — Progress Notes (Signed)
Pt is in the office today for asthma Pt states she needs her inhalers and medication refill

## 2016-09-23 NOTE — Progress Notes (Signed)
Genevie Elman, is a 35 y.o. female  QMV:[id number]  MWU:[id number]  DOB - November 20, 1981  Subjective:  Chief Complaint and HPI: Uzma Hellmer is a 35 y.o. female here today for RF on her asthma medications.  Since starting [facility], she only has to use her albuterol inhaler about 1X per week(and sometimes less).  She needs RF of all her medications.  She denies cough or SOB. She has not gotten a flu shot this year.      ROS:   Constitutional:  No f/c, No night sweats, No unexplained weight loss. EENT:  No vision changes, No blurry vision, No hearing changes. No mouth, throat, or ear problems.  Respiratory: No cough, No SOB, +rare wheezing on Dulera Cardiac: No CP, no palpitations GI:  No abd pain, No N/V/D. GU: No Urinary s/sx Musculoskeletal: No joint pain Neuro: No headache, no dizziness, no motor weakness.  Skin: No rash Endocrine:  No polydipsia. No polyuria.  Psych: Denies SI/HI  No problems updated.  ALLERGIES: No Known Allergies  PAST MEDICAL HISTORY: Past Medical History:  Diagnosis Date  . Asthma   . Complication of anesthesia   . GERD (gastroesophageal reflux disease)   . Pregnancy as incidental finding     MEDICATIONS AT HOME: Prior to Admission medications   Medication Sig Start Date End Date Taking? Authorizing Provider  acetaminophen (TYLENOL) 325 MG tablet Uses as needed for pain   Yes Historical Provider, MD  albuterol (PROVENTIL) (2.5 MG/3ML) 0.083% nebulizer solution Take 3 mLs (2.5 mg total) by nebulization every 6 (six) hours as needed for wheezing. 10/27/15 10/26/16 Yes Ambrose Finland, NP  cetirizine (ZYRTEC) 10 MG tablet Take 10 mg by mouth daily.   Yes Historical Provider, MD  esomeprazole (NEXIUM) 20 MG capsule Take 20 mg by mouth daily at 12 noon.   Yes Historical Provider, MD  famotidine (PEPCID) 20 MG tablet Take 1 tablet (20 mg total) by mouth at bedtime. 12/08/12  Yes Vilinda Blanks Minor, NP  fexofenadine (ALLEGRA) 60 MG tablet Take 60 mg by mouth 2  (two) times daily.   Yes Historical Provider, MD  fluticasone (FLONASE) 50 MCG/ACT nasal spray Place 1 spray into both nostrils 2 (two) times daily. 09/23/16  Yes Marzella Schlein McClung, PA-C  ipratropium (ATROVENT) 0.02 % nebulizer solution Take 2.5 mLs (0.5 mg total) by nebulization 4 (four) times daily. 09/23/16  Yes Marzella Schlein McClung, PA-C  mometasone-formoterol (DULERA) 200-5 MCG/ACT AERO Inhale 2 puffs into the lungs 2 (two) times daily. 09/23/16  Yes Anders Simmonds, PA-C  albuterol (PROVENTIL HFA;VENTOLIN HFA) 108 (90 Base) MCG/ACT inhaler Inhale 1-2 puffs into the lungs every 6 (six) hours as needed for wheezing. 09/23/16 09/23/17  Anders Simmonds, PA-C  ibuprofen (ADVIL,MOTRIN) 200 MG tablet Per bottle    Historical Provider, MD     Objective:  EXAM:   Vitals:   09/23/16 1645  BP: 111/76  Pulse: 84  Resp: 16  Temp: 98.6 F (37 C)  TempSrc: Oral  SpO2: 99%  Weight: 215 lb (97.5 kg)    General appearance : A&OX3. NAD. Non-toxic-appearing HEENT: Atraumatic and Normocephalic.  PERRLA. EOM intact.  TM clear B. Mouth-MMM, post pharynx WNL w/o erythema, No PND. Neck: supple, no JVD. No cervical lymphadenopathy. No thyromegaly Chest/Lungs:  Breathing-non-labored, Good air entry bilaterally, no rales, no rhonchi.  Some minimal wheezing B bases with forced expiration.  CVS: S1 S2 regular, no murmurs, gallops, rubs  Extremities: Bilateral Lower Ext shows no edema, both legs are  warm to touch with = pulse throughout Neurology:  CN II-XII grossly intact, Non focal.   Psych:  TP linear. J/I WNL. Normal speech. Appropriate eye contact and affect.  Skin:  No Rash  Data Review Lab Results  Component Value Date   HGBA1C 5.7 (H) 04/06/2015     Assessment & Plan   1. Moderate persistent asthma, uncomplicated - mometasone-formoterol (DULERA) 200-5 MCG/ACT AERO; Inhale 2 puffs into the lungs 2 (two) times daily.  Dispense: 13 g; Refill: 7 - ipratropium (ATROVENT) 0.02 % nebulizer  solution; Take 2.5 mLs (0.5 mg total) by nebulization 4 (four) times daily.  Dispense: 30 mL; Refill: 2 - fluticasone (FLONASE) 50 MCG/ACT nasal spray; Place 1 spray into both nostrils 2 (two) times daily.  Dispense: 16 g; Refill: 3 - albuterol (PROVENTIL HFA;VENTOLIN HFA) 108 (90 Base) MCG/ACT inhaler; Inhale 1-2 puffs into the lungs every 6 (six) hours as needed for wheezing.  Dispense: 3 Inhaler; Refill: 3  2. Screening for diabetes mellitus - Glucose (CBG)=non-fasting I have had a lengthy discussion and provided education about insulin resistance and the intake of too much sugar/refined carbohydrates.  I have advised the patient to work at a goal of eliminating sugary drinks, candy, desserts, sweets, refined sugars, processed foods, and white carbohydrates.  The patient expresses understanding.    3. Needs flu shot Flu shot given   Patient have been counseled extensively about nutrition and exercise  Return in about 7 months (around 04/23/2017) for assign PCP(she was a patient of Valerie's).  The patient was given clear instructions to go to ER or return to medical center if symptoms don't improve, worsen or new problems develop. The patient verbalized understanding. The patient was told to call to get lab results if they haven't heard anything in the next week.     Georgian CoAngela McClung, PA-C [facility] [place], [place] [phone]   09/23/2016, 5:13 PMPatient ID: Hendricks LimesSarah E Meunier, female   DOB: 06/12/1981, 35 y.o.   MRN: [id number]

## 2016-09-23 NOTE — Patient Instructions (Signed)

## 2016-09-24 MED FILL — VENTOLIN HFA 90 MCG INHALER: 108 (90 BAS | 30 days supply | Qty: 18 | Fill #0

## 2016-09-24 MED FILL — IPRATROPIUM BR 0.02% SOLN: 0.02 | 30 days supply | Qty: 3 | Fill #0

## 2016-09-24 MED FILL — FLUTICASONE PROP 50 MCG SPR: 50 | 30 days supply | Qty: 16 | Fill #0

## 2016-09-24 MED FILL — **DULERA 200 MCG/5 MCG INHA: 200-5 MCG | 30 days supply | Qty: 2 | Fill #0

## 2016-09-30 MED FILL — ALBUTEROL 0.083% INHAL SOLN: (2.5 MG/3ML | 8 days supply | Qty: 90 | Fill #1

## 2016-10-02 ENCOUNTER — Encounter (HOSPITAL_COMMUNITY): Payer: Self-pay | Admitting: Emergency Medicine

## 2016-10-02 ENCOUNTER — Ambulatory Visit (HOSPITAL_COMMUNITY)
Admission: EM | Admit: 2016-10-02 | Discharge: 2016-10-02 | Disposition: A | Discharge status: Home / Self Care | Payer: Self-pay | Attending: Physician Assistant | Admitting: Physician Assistant

## 2016-10-02 DIAGNOSIS — J029 Acute pharyngitis, unspecified: Secondary | ICD-10-CM | POA: Insufficient documentation

## 2016-10-02 DIAGNOSIS — Z87891 Personal history of nicotine dependence: Secondary | ICD-10-CM | POA: Insufficient documentation

## 2016-10-02 DIAGNOSIS — Z79899 Other long term (current) drug therapy: Secondary | ICD-10-CM | POA: Insufficient documentation

## 2016-10-02 LAB — POCT RAPID STREP A: Streptococcus, Group A Screen (Direct): NEGATIVE

## 2016-10-02 MED ORDER — DOXYCYCLINE HYCLATE 100 MG PO CAPS: 100.0000 mg | ORAL_CAPSULE | Freq: Two times a day (BID) | ORAL | 0 refills | Status: DC

## 2016-10-02 MED FILL — DOXYCYCLINE 100 MG TABLET: 100 | 10 days supply | Qty: 20 | Fill #0

## 2016-10-02 NOTE — ED Triage Notes (Signed)
The patient presented to the [facility] with a complaint of a sore throat x 1 week. The patient denied any fever. 

## 2016-10-02 NOTE — ED Provider Notes (Signed)
CSN: [id number]     Arrival date & time 10/02/16  1118 History   First MD Initiated Contact with Patient 10/02/16 1321     Chief Complaint  Patient presents with  . Sore Throat   (Consider location/radiation/quality/duration/timing/severity/associated sxs/prior Treatment) HPI NP 35 Y/O FEMALE WAS TREATED FOR CHLAMYDIA. UNSURE IF PARTNER WAS. HAD ORAL SEX WITH PARTNER, DAY AFTER DEVELOP A ROARING SORE THROAT THAT HAS PERSISTED FOR ALMOST 1 WEEK WITHOUT ANY RESOLUTION DESPITE OTC MEDS. VERY WORRIED SHE HAS AN STD OF THE THROAT.  Past Medical History:  Diagnosis Date  . Asthma   . Complication of anesthesia   . GERD (gastroesophageal reflux disease)   . Pregnancy as incidental finding    Past Surgical History:  Procedure Laterality Date  . CHOLECYSTECTOMY  2001  . LAPAROSCOPIC TUBAL LIGATION  08/19/2012   Procedure: LAPAROSCOPIC TUBAL LIGATION;  Surgeon: Lazaro Arms, MD;  Location: [facility];  Service: Gynecology;  Laterality: Bilateral;  . lypoma removal  2003   right arm   . NASAL SINUS SURGERY     Family History  Problem Relation Age of Onset  . Heart disease Paternal Grandfather   . Cancer Maternal Grandmother    Social History  Substance Use Topics  . Smoking status: Former Smoker    Packs/day: 1.00    Years: 2.00    Types: Cigarettes    Quit date: 07/10/2011  . Smokeless tobacco: Never Used  . Alcohol use 0.0 oz/week     Comment: Occ   OB History    Gravida Para Term Preterm AB Living   3 3 2 1  0 3   SAB TAB Ectopic Multiple Live Births   0 0 0 0 1     Review of Systems  Allergies  Review of patient's allergies indicates no known allergies.  Home Medications   Prior to Admission medications   Medication Sig Start Date End Date Taking? Authorizing Provider  acetaminophen (TYLENOL) 325 MG tablet Uses as needed for pain   Yes Historical Provider, MD  albuterol (PROVENTIL HFA;VENTOLIN HFA) 108 (90 Base) MCG/ACT inhaler Inhale 1-2 puffs into the lungs every 6  (six) hours as needed for wheezing. 09/23/16 09/23/17 Yes Marzella Schlein McClung, PA-C  albuterol (PROVENTIL) (2.5 MG/3ML) 0.083% nebulizer solution Take 3 mLs (2.5 mg total) by nebulization every 6 (six) hours as needed for wheezing. 10/27/15 10/26/16 Yes Ambrose Finland, NP  cetirizine (ZYRTEC) 10 MG tablet Take 10 mg by mouth daily.   Yes Historical Provider, MD  esomeprazole (NEXIUM) 20 MG capsule Take 20 mg by mouth daily at 12 noon.   Yes Historical Provider, MD  famotidine (PEPCID) 20 MG tablet Take 1 tablet (20 mg total) by mouth at bedtime. 12/08/12  Yes Vilinda Blanks Minor, NP  fexofenadine (ALLEGRA) 60 MG tablet Take 60 mg by mouth 2 (two) times daily.   Yes Historical Provider, MD  fluticasone (FLONASE) 50 MCG/ACT nasal spray Place 1 spray into both nostrils 2 (two) times daily. 09/23/16  Yes Marzella Schlein McClung, PA-C  ibuprofen (ADVIL,MOTRIN) 200 MG tablet Per bottle   Yes Historical Provider, MD  mometasone-formoterol (DULERA) 200-5 MCG/ACT AERO Inhale 2 puffs into the lungs 2 (two) times daily. 09/23/16  Yes Marzella Schlein McClung, PA-C  ipratropium (ATROVENT) 0.02 % nebulizer solution Take 2.5 mLs (0.5 mg total) by nebulization 4 (four) times daily. 09/23/16   Anders Simmonds, PA-C   Meds Ordered and Administered this Visit  Medications - No data to display  BP  126/74 (BP Location: Left Arm)   Pulse 84   Temp 98.3 F (36.8 C) (Oral)   Resp 18   LMP 10/01/2016 (Exact Date)   SpO2 100%  No data found.   Physical Exam NURSES NOTES AND VITAL SIGNS REVIEWED. CONSTITUTIONAL: Well developed, well nourished, no acute distress HEENT: normocephalic, atraumatic THROAT NO EXUDATE. EYES: Conjunctiva normal NECK:normal ROM, supple, no adenopathy PULMONARY:No respiratory distress, normal effort ABDOMINAL: Soft, ND, NT BS+, No CVAT MUSCULOSKELETAL: Normal ROM of all extremities,  SKIN: warm and dry without rash PSYCHIATRIC: Mood and affect, behavior are normal  Urgent Care Course   Clinical Course     Procedures (including critical care time)  Labs Review Labs Reviewed  POCT RAPID STREP A  CYTOLOGY, (ORAL, ANAL, URETHRAL) ANCILLARY ONLY    Imaging Review No results found.   Visual Acuity Review  Right Eye Distance:   Left Eye Distance:   Bilateral Distance:    Right Eye Near:   Left Eye Near:    Bilateral Near:       TREATED WITH DOXY AWAIT THROAT STD CULTURE.  MDM   1. Acute pharyngitis, unspecified etiology     Patient is reassured that there are no issues that require transfer to higher level of care at this time or additional tests. Patient is advised to continue home symptomatic treatment. Patient is advised that if there are new or worsening symptoms to attend the emergency department, contact primary care provider, or return to UC. Instructions of care provided discharged home in stable condition.    THIS NOTE WAS GENERATED USING A VOICE RECOGNITION SOFTWARE PROGRAM. ALL REASONABLE EFFORTS  WERE MADE TO PROOFREAD THIS DOCUMENT FOR ACCURACY.  I have verbally reviewed the discharge instructions with the patient. A printed AVS was given to the patient.  All questions were answered prior to discharge.      Tharon AquasFrank C Ashar Lewinski, PA 10/02/16 1750

## 2016-10-03 LAB — CYTOLOGY, (ORAL, ANAL, URETHRAL) ANCILLARY ONLY
CHLAMYDIA, DNA PROBE: NEGATIVE
NEISSERIA GONORRHEA: NEGATIVE

## 2016-10-05 LAB — CULTURE, GROUP A STREP (THRC)

## 2016-10-28 ENCOUNTER — Other Ambulatory Visit: Payer: Self-pay | Admitting: *Deleted

## 2016-10-28 DIAGNOSIS — J4540 Moderate persistent asthma, uncomplicated: Secondary | ICD-10-CM

## 2016-10-28 MED ORDER — ALBUTEROL SULFATE HFA 108 (90 BASE) MCG/ACT IN AERS: 1.0000 | INHALATION_SPRAY | Freq: Four times a day (QID) | RESPIRATORY_TRACT | 3 refills | Status: DC | PRN

## 2016-10-28 MED ORDER — MOMETASONE FURO-FORMOTEROL FUM 200-5 MCG/ACT IN AERO: 2.0000 | INHALATION_SPRAY | Freq: Two times a day (BID) | RESPIRATORY_TRACT | 7 refills | Status: DC

## 2016-10-28 MED FILL — !DULERA 200 MCG/5 MCG INH: 200-5 | 30 days supply | Qty: 1 | Fill #0

## 2016-10-28 MED FILL — VENTOLIN HFA 90 MCG INHALER: 108 (90 BAS | 25 days supply | Qty: 18 | Fill #0

## 2016-10-28 NOTE — Telephone Encounter (Signed)
PRINTED FOR PASS PROGRAM 

## 2016-11-11 ENCOUNTER — Other Ambulatory Visit: Payer: Self-pay | Admitting: *Deleted

## 2016-11-11 DIAGNOSIS — J4540 Moderate persistent asthma, uncomplicated: Secondary | ICD-10-CM

## 2016-11-11 MED ORDER — ALBUTEROL SULFATE HFA 108 (90 BASE) MCG/ACT IN AERS: 1.0000 | INHALATION_SPRAY | Freq: Four times a day (QID) | RESPIRATORY_TRACT | 3 refills | Status: DC | PRN

## 2016-11-11 MED ORDER — MOMETASONE FURO-FORMOTEROL FUM 200-5 MCG/ACT IN AERO: 2.0000 | INHALATION_SPRAY | Freq: Two times a day (BID) | RESPIRATORY_TRACT | 3 refills | Status: DC

## 2016-11-11 NOTE — Telephone Encounter (Signed)
PRINTED FOR THE PASS PROGRAM 

## 2016-11-19 ENCOUNTER — Encounter (HOSPITAL_COMMUNITY): Payer: Self-pay | Admitting: Emergency Medicine

## 2016-11-19 ENCOUNTER — Ambulatory Visit (INDEPENDENT_AMBULATORY_CARE_PROVIDER_SITE_OTHER): Payer: Self-pay

## 2016-11-19 ENCOUNTER — Ambulatory Visit (HOSPITAL_COMMUNITY)
Admission: EM | Admit: 2016-11-19 | Discharge: 2016-11-19 | Disposition: A | Discharge status: Home / Self Care | Payer: Self-pay | Attending: Family Medicine | Admitting: Family Medicine

## 2016-11-19 DIAGNOSIS — J4541 Moderate persistent asthma with (acute) exacerbation: Secondary | ICD-10-CM

## 2016-11-19 MED ORDER — METHYLPREDNISOLONE SODIUM SUCC 125 MG IJ SOLR
125.0000 mg | Freq: Once | INTRAMUSCULAR | Status: AC
  Administered 2016-11-19: 125 mg via INTRAMUSCULAR

## 2016-11-19 MED ORDER — METHYLPREDNISOLONE SODIUM SUCC 125 MG IJ SOLR
INTRAMUSCULAR | Status: AC
  Filled 2016-11-19: qty 2

## 2016-11-19 MED ORDER — PREDNISONE 50 MG PO TABS: 50.0000 mg | ORAL_TABLET | Freq: Every day | ORAL | 0 refills | Status: DC

## 2016-11-19 MED FILL — ?PREDNISONE 10 MG TABLET: 10 | 4 days supply | Qty: 20 | Fill #0

## 2016-11-19 NOTE — ED Provider Notes (Signed)
CSN: [id number]     Arrival date & time 11/19/16  1302 History   None    Chief Complaint  Patient presents with  . Shortness of Breath   (Consider location/radiation/quality/duration/timing/severity/associated sxs/prior Treatment) HPI Katrina Mitchell is a 35yo female with PMH of asthma and allergic rhinitis who presents with wheezing and shortness of breath for the past few days. Also reports of 1 day history of productive cough. Reports of a subjective fever possibly yesterday, nasal congestion and post-nasal drip. She has been taking her allergy medications which include Flonase and Zyrtec. Has been needing Albuterol 3-4 times a day. Today at work she reports she needed is almost every 15 minutes. She went home and took duoneb nebulizer which improved her symptoms. She is taking Dulera as prescribed. Former smoker; quit 5 years ago.   Past Medical History:  Diagnosis Date  . Asthma   . Complication of anesthesia   . GERD (gastroesophageal reflux disease)   . Pregnancy as incidental finding    Past Surgical History:  Procedure Laterality Date  . CHOLECYSTECTOMY  2001  . LAPAROSCOPIC TUBAL LIGATION  08/19/2012   Procedure: LAPAROSCOPIC TUBAL LIGATION;  Surgeon: Lazaro ArmsLuther H Eure, MD;  Location: [facility];  Service: Gynecology;  Laterality: Bilateral;  . lypoma removal  2003   right arm   . NASAL SINUS SURGERY     Family History  Problem Relation Age of Onset  . Heart disease Paternal Grandfather   . Cancer Maternal Grandmother    Social History  Substance Use Topics  . Smoking status: Former Smoker    Packs/day: 1.00    Years: 2.00    Types: Cigarettes    Quit date: 07/10/2011  . Smokeless tobacco: Never Used  . Alcohol use 0.0 oz/week     Comment: Occ   OB History    Gravida Para Term Preterm AB Living   3 3 2 1  0 3   SAB TAB Ectopic Multiple Live Births   0 0 0 0 1     Review of Systems: as noted above  Allergies  Patient has no known allergies.  Home Medications   Prior  to Admission medications   Medication Sig Start Date End Date Taking? Authorizing Provider  acetaminophen (TYLENOL) 325 MG tablet Uses as needed for pain    Historical Provider, MD  albuterol (PROVENTIL HFA;VENTOLIN HFA) 108 (90 Base) MCG/ACT inhaler Inhale 1-2 puffs into the lungs every 6 (six) hours as needed for wheezing. 11/11/16 11/11/17  Quentin Angstlugbemiga E Jegede, MD  albuterol (PROVENTIL) (2.5 MG/3ML) 0.083% nebulizer solution Take 3 mLs (2.5 mg total) by nebulization every 6 (six) hours as needed for wheezing. 10/27/15 10/26/16  Ambrose FinlandValerie A Keck, NP  cetirizine (ZYRTEC) 10 MG tablet Take 10 mg by mouth daily.    Historical Provider, MD  esomeprazole (NEXIUM) 20 MG capsule Take 20 mg by mouth daily at 12 noon.    Historical Provider, MD  famotidine (PEPCID) 20 MG tablet Take 1 tablet (20 mg total) by mouth at bedtime. 12/08/12   Vilinda BlanksWilliam S Minor, NP  fexofenadine (ALLEGRA) 60 MG tablet Take 60 mg by mouth 2 (two) times daily.    Historical Provider, MD  fluticasone (FLONASE) 50 MCG/ACT nasal spray Place 1 spray into both nostrils 2 (two) times daily. 09/23/16   Anders SimmondsAngela M McClung, PA-C  ibuprofen (ADVIL,MOTRIN) 200 MG tablet Per bottle    Historical Provider, MD  ipratropium (ATROVENT) 0.02 % nebulizer solution Take 2.5 mLs (0.5 mg total) by nebulization 4 (  four) times daily. 09/23/16   Anders SimmondsAngela M McClung, PA-C  mometasone-formoterol (DULERA) 200-5 MCG/ACT AERO Inhale 2 puffs into the lungs 2 (two) times daily. 11/11/16   Quentin Angstlugbemiga E Jegede, MD   Meds Ordered and Administered this Visit  Medications - No data to display  BP 142/95 (BP Location: Left Arm)   Pulse 96   Temp 98 F (36.7 C) (Oral)   Resp 20   SpO2 99%  No data found.  Physical Exam  Constitutional: She is oriented to person, place, and time. She appears well-developed and well-nourished. No distress.  HENT:  Nose: Nose normal.  Mouth/Throat: Oropharynx is clear and moist. No oropharyngeal exudate.  TMs normal bilaterally. No sinus  tenderness   Eyes: Conjunctivae are normal. Right eye exhibits no discharge. Left eye exhibits no discharge.  Neck: Normal range of motion.  Cardiovascular: Normal rate, regular rhythm and normal heart sounds.  Exam reveals no gallop and no friction rub.   No murmur heard. Pulmonary/Chest: Effort normal and breath sounds normal. No respiratory distress. She has no wheezes. She has no rales.  Lymphadenopathy:    She has no cervical adenopathy.  Neurological: She is alert and oriented to person, place, and time.  Skin: Skin is warm and dry. Capillary refill takes less than 2 seconds. She is not diaphoretic.  Psychiatric: She has a normal mood and affect. Her behavior is normal.    Urgent Care Course   Clinical Course   History consistent with an asthma exacerbation, mild. Patient with stable vitals and no signs of increased work of breathing or wheezing; pulse ox within normal limits. CXR is negative for PNA. Received Solumedrol 125mg  IM x 1. Will prescribe Prednisone 50mg  daily for 4 days starting 12/13. Recommended continuing Duoneb or Albuterol q 4-6 hours PRN. Return precautions discussed.   Procedures  Labs Review Labs Reviewed - No data to display  Imaging Review No results found.   MDM   1. Moderate persistent asthma with exacerbation   History consistent with an asthma exacerbation, mild. Patient with stable vitals and no signs of increased work of breathing or wheezing; pulse ox within normal limits. CXR is negative for PNA. Received Solumedrol 125mg  IM x 1. Will prescribe Prednisone 50mg  daily for 4 days starting 12/13. Recommended continuing Duoneb or Albuterol q 4-6 hours PRN. Return precautions discussed.    Presentation, Exam, Assessment and Plan discussed with Dr. Wynema BirchKindl  Jensyn Cambria G David Towson, MD  PGY 2 Family Medicine      Palma HolterKanishka G Suede Greenawalt, MD 11/19/16 [phone]

## 2016-11-19 NOTE — Discharge Instructions (Signed)
For your asthma exacerbation you received a steroid shot today. Please take Prednisone tablet daily starting tomorrow (12/13) for 4 days. Continue to use your rescue inhaler/nebulizers as needed. If your symptoms worsen please seek immediate medical care.

## 2016-11-19 NOTE — ED Triage Notes (Signed)
Patient concerned for asthma flare-up.  Symptoms for 2 days.  Congested cough, yellow phlegm.  Unknown if a fever

## 2016-12-20 MED FILL — $Dulera 200mcg/5 mcg Inh: 200-5 | 30 days supply | Qty: 1 | Fill #1

## 2016-12-24 ENCOUNTER — Emergency Department
Admission: EM | Admit: 2016-12-24 | Discharge: 2016-12-24 | Disposition: A | Discharge status: Home / Self Care | Payer: Self-pay | Attending: Emergency Medicine | Admitting: Emergency Medicine

## 2016-12-24 ENCOUNTER — Encounter: Payer: Self-pay | Admitting: Emergency Medicine

## 2016-12-24 ENCOUNTER — Emergency Department: Payer: Self-pay

## 2016-12-24 DIAGNOSIS — Z87891 Personal history of nicotine dependence: Secondary | ICD-10-CM | POA: Insufficient documentation

## 2016-12-24 DIAGNOSIS — J181 Lobar pneumonia, unspecified organism: Secondary | ICD-10-CM | POA: Insufficient documentation

## 2016-12-24 DIAGNOSIS — J4521 Mild intermittent asthma with (acute) exacerbation: Secondary | ICD-10-CM | POA: Insufficient documentation

## 2016-12-24 DIAGNOSIS — J189 Pneumonia, unspecified organism: Secondary | ICD-10-CM

## 2016-12-24 MED ORDER — PREDNISONE 50 MG PO TABS
50.0000 mg | ORAL_TABLET | Freq: Every day | ORAL | 0 refills | Status: DC
Start: 2016-12-24 — End: 2017-02-14

## 2016-12-24 MED ORDER — METHYLPREDNISOLONE SODIUM SUCC 125 MG IJ SOLR
125.0000 mg | Freq: Once | INTRAMUSCULAR | Status: AC
  Administered 2016-12-24: 125 mg via INTRAMUSCULAR
  Filled 2016-12-24: qty 2

## 2016-12-24 MED ORDER — IPRATROPIUM-ALBUTEROL 0.5-2.5 (3) MG/3ML IN SOLN
3.0000 mL | Freq: Once | RESPIRATORY_TRACT | Status: AC
  Administered 2016-12-24: 3 mL via RESPIRATORY_TRACT
  Filled 2016-12-24: qty 3

## 2016-12-24 MED ORDER — AZITHROMYCIN 250 MG PO TABS: ORAL_TABLET | ORAL | 0 refills | Status: DC

## 2016-12-24 NOTE — ED Triage Notes (Signed)
Pt to ED from home c/o cough and sob x1 week worsening past couple of days.  Pt states hx of asthma and using inhaler and nebulizer at home with little relief.  Denies fevers at home, denies n/v/d.  States strong productive cough with green sputum.

## 2016-12-24 NOTE — ED Provider Notes (Signed)
[facility] Emergency Department Provider Note  ____________________________________________  Time seen: Approximately 3:11 PM  I have reviewed the triage vital signs and the nursing notes.   HISTORY  Chief Complaint URI and Cough    HPI Katrina Mitchell is a 36 y.o. female who presents emergency department complaining of shortness of breath, cough. Patient has a history of asthma and states that she has had increased shortness of breath and wheezing. She reports that she has a productive cough. She denies any frank fevers or chills, headache, visual changes, chest pain, abdominal pain, nausea or vomiting. Patient has had some mild nasal congestion but states that she has been using her allergy medication for this complaint. No complaints at this time. Patient states that her asthma is typically well-managed but does have problems with change in weather/seasons.   Past Medical History:  Diagnosis Date  . Asthma   . Complication of anesthesia   . GERD (gastroesophageal reflux disease)   . Pregnancy as incidental finding     Patient Active Problem List   Diagnosis Date Noted  . Cervicogenic headache 01/23/2016  . Dizziness 12/27/2015  . Episodic confusion 12/27/2015  . Moderate persistent asthma, high risk. Status post intubation December 2013 01/14/2013  . Status asthmaticus 12/06/2012  . Sterilization 07/01/2012    Past Surgical History:  Procedure Laterality Date  . CHOLECYSTECTOMY  2001  . LAPAROSCOPIC TUBAL LIGATION  08/19/2012   Procedure: LAPAROSCOPIC TUBAL LIGATION;  Surgeon: Lazaro Arms, MD;  Location: [facility];  Service: Gynecology;  Laterality: Bilateral;  . lypoma removal  2003   right arm   . NASAL SINUS SURGERY      Prior to Admission medications   Medication Sig Start Date End Date Taking? Authorizing Provider  acetaminophen (TYLENOL) 325 MG tablet Uses as needed for pain    Historical Provider, MD  albuterol (PROVENTIL HFA;VENTOLIN  HFA) 108 (90 Base) MCG/ACT inhaler Inhale 1-2 puffs into the lungs every 6 (six) hours as needed for wheezing. 11/11/16 11/11/17  Quentin Angst, MD  albuterol (PROVENTIL) (2.5 MG/3ML) 0.083% nebulizer solution Take 3 mLs (2.5 mg total) by nebulization every 6 (six) hours as needed for wheezing. 10/27/15 10/26/16  Ambrose Finland, NP  azithromycin (ZITHROMAX Z-PAK) 250 MG tablet Take 2 tablets (500 mg) on  Day 1,  followed by 1 tablet (250 mg) once daily on Days 2 through 5. 12/24/16   Christiane Ha D Locklan Canoy, PA-C  cetirizine (ZYRTEC) 10 MG tablet Take 10 mg by mouth daily.    Historical Provider, MD  esomeprazole (NEXIUM) 20 MG capsule Take 20 mg by mouth daily at 12 noon.    Historical Provider, MD  famotidine (PEPCID) 20 MG tablet Take 1 tablet (20 mg total) by mouth at bedtime. 12/08/12   Vilinda Blanks Minor, NP  fexofenadine (ALLEGRA) 60 MG tablet Take 60 mg by mouth 2 (two) times daily.    Historical Provider, MD  fluticasone (FLONASE) 50 MCG/ACT nasal spray Place 1 spray into both nostrils 2 (two) times daily. 09/23/16   Anders Simmonds, PA-C  ibuprofen (ADVIL,MOTRIN) 200 MG tablet Per bottle    Historical Provider, MD  ipratropium (ATROVENT) 0.02 % nebulizer solution Take 2.5 mLs (0.5 mg total) by nebulization 4 (four) times daily. 09/23/16   Anders Simmonds, PA-C  mometasone-formoterol (DULERA) 200-5 MCG/ACT AERO Inhale 2 puffs into the lungs 2 (two) times daily. 11/11/16   Quentin Angst, MD  predniSONE (DELTASONE) 50 MG tablet Take 1 tablet (50  mg total) by mouth daily with breakfast. 12/24/16   Delorise RoyalsJonathan D Daivon Rayos, PA-C    Allergies Patient has no known allergies.  Family History  Problem Relation Age of Onset  . Heart disease Paternal Grandfather   . Cancer Maternal Grandmother     Social History Social History  Substance Use Topics  . Smoking status: Former Smoker    Packs/day: 1.00    Years: 2.00    Types: Cigarettes    Quit date: 07/10/2011  . Smokeless tobacco: Never  Used  . Alcohol use 0.0 oz/week     Comment: Occ     Review of Systems  Constitutional: No fever/chills Eyes: No visual changes. No discharge ENT: No upper respiratory complaints. Cardiovascular: no chest pain. Respiratory: Positive cough. Positive SOB Gastrointestinal: No abdominal pain.  No nausea, no vomiting.  No diarrhea.  No constipation. Musculoskeletal: Negative for musculoskeletal pain. Skin: Negative for rash, abrasions, lacerations, ecchymosis. Neurological: Negative for headaches, focal weakness or numbness. 10-point ROS otherwise negative.  ____________________________________________   PHYSICAL EXAM:  VITAL SIGNS: ED Triage Vitals  Enc Vitals Group     BP 12/24/16 1439 138/89     Pulse Rate 12/24/16 1439 86     Resp 12/24/16 1439 17     Temp 12/24/16 1439 98.2 F (36.8 C)     Temp Source 12/24/16 1439 Oral     SpO2 12/24/16 1439 100 %     Weight 12/24/16 1440 200 lb (90.7 kg)     Height 12/24/16 1440 5\' 7"  (1.702 m)     Head Circumference --      Peak Flow --      Pain Score 12/24/16 1440 3     Pain Loc --      Pain Edu? --      Excl. in GC? --      Constitutional: Alert and oriented. Well appearing and in no acute distress. Eyes: Conjunctivae are normal. PERRL. EOMI. Head: Atraumatic. ENT:      Ears: EAC's and TMs unremarkable bilaterally      Nose: Mild congestion/rhinnorhea.      Mouth/Throat: Mucous membranes are moist.  Neck: No stridor.    Cardiovascular: Normal rate, regular rhythm. Normal S1 and S2.  Good peripheral circulation. Respiratory: Normal respiratory effort without tachypnea or retractions. Lungs with wheezing bilaterally. Crackles in left lower lung field. Good air entry to the bases with no decreased or absent breath sounds. Musculoskeletal: Full range of motion to all extremities. No gross deformities appreciated. Neurologic:  Normal speech and language. No gross focal neurologic deficits are appreciated.  Skin:  Skin is warm,  dry and intact. No rash noted. Psychiatric: Mood and affect are normal. Speech and behavior are normal. Patient exhibits appropriate insight and judgement.   ____________________________________________   LABS (all labs ordered are listed, but only abnormal results are displayed)  Labs Reviewed - No data to display ____________________________________________  EKG   ____________________________________________  RADIOLOGY Festus BarrenI, Wonder Donaway D Berlyn Malina, personally viewed and evaluated these images (plain radiographs) as part of my medical decision making, as well as reviewing the written report by the radiologist.  Dg Chest 2 View  Result Date: 12/24/2016 CLINICAL DATA:  Shortness of breath and cough EXAM: CHEST  2 VIEW COMPARISON:  November 19, 2016 FINDINGS: Lungs are clear. Heart size and pulmonary vascularity are normal. No adenopathy. No bone lesions. IMPRESSION: No edema or consolidation. Electronically Signed   By: Bretta BangWilliam  Woodruff III M.D.   On: 12/24/2016 15:41  ____________________________________________    PROCEDURES  Procedure(s) performed:    Procedures    Medications  ipratropium-albuterol (DUONEB) 0.5-2.5 (3) MG/3ML nebulizer solution 3 mL (3 mLs Nebulization Given 12/24/16 1542)  methylPREDNISolone sodium succinate (SOLU-MEDROL) 125 mg/2 mL injection 125 mg (125 mg Intramuscular Given 12/24/16 1542)     ____________________________________________   INITIAL IMPRESSION / ASSESSMENT AND PLAN / ED COURSE  Pertinent labs & imaging results that were available during my care of the patient were reviewed by me and considered in my medical decision making (see chart for details).  Review of the [place] CSRS was performed in accordance of the [facility] prior to dispensing any controlled drugs.  Clinical Course     Patient's diagnosis is consistent with Asthma exacerbation with community acquired pneumonia. Patient has had symptoms ongoing 10 days. X-ray reveals no  acute effusion or consolidation consistent with pneumonia, however reevaluation after steroids and asthma continues to reveal crackles to the left lower lobe. The patient's ten-day history, providing still concerning with community-acquired pneumonia, patient will be placed on antibiotics in addition to steroids.. She will follow-up primary care as needed. Patient is given ED precautions to return to the ED for any worsening or new symptoms.     ____________________________________________  FINAL CLINICAL IMPRESSION(S) / ED DIAGNOSES  Final diagnoses:  Mild intermittent asthma with acute exacerbation  Community acquired pneumonia of left lower lobe of lung (HCC)      NEW MEDICATIONS STARTED DURING THIS VISIT:  New Prescriptions   AZITHROMYCIN (ZITHROMAX Z-PAK) 250 MG TABLET    Take 2 tablets (500 mg) on  Day 1,  followed by 1 tablet (250 mg) once daily on Days 2 through 5.   PREDNISONE (DELTASONE) 50 MG TABLET    Take 1 tablet (50 mg total) by mouth daily with breakfast.        This chart was dictated using voice recognition software/Dragon. Despite best efforts to proofread, errors can occur which can change the meaning. Any change was purely unintentional.    Racheal Patches, PA-C 12/24/16 1607    Myrna Blazer, MD 12/25/16 [phone]

## 2016-12-30 ENCOUNTER — Other Ambulatory Visit: Payer: Self-pay | Admitting: Internal Medicine

## 2016-12-30 DIAGNOSIS — J4541 Moderate persistent asthma with (acute) exacerbation: Secondary | ICD-10-CM

## 2016-12-30 MED FILL — IPRATROPIUM BR 0.02% SOLN: 0.02 | 30 days supply | Qty: 3 | Fill #1

## 2016-12-30 MED FILL — $VENTOLIN HFA 18G INHALER: 108 (90 BAS | 25 days supply | Qty: 18 | Fill #1

## 2016-12-30 MED FILL — CYCLOBENZAPRINE 5 MG TABLET: 5 | 30 days supply | Qty: 10 | Fill #1

## 2016-12-31 MED FILL — ALBUTEROL 0.083% INHAL SOLN: (2.5 MG/3ML | 7 days supply | Qty: 90 | Fill #0

## 2017-01-23 ENCOUNTER — Telehealth: Payer: Self-pay

## 2017-01-23 NOTE — Telephone Encounter (Signed)
01/23/2017   Attempted to call patient to schedule for new pharmacy service regarding inhaler education and technique. Left HIPAA compliant message with clinic phone number to call to schedule and Dr. Bufford LopeHammer's phone number for more information regarding service.  Allie BossierApryl Airon Sahni, PharmD PGY1 Pharmacy Resident [phone]) 01/23/2017 12:08 PM

## 2017-02-05 MED FILL — $VENTOLIN HFA 18G INHALER: 108 (90 BAS | 25 days supply | Qty: 18 | Fill #2

## 2017-02-10 ENCOUNTER — Ambulatory Visit: Payer: Self-pay | Attending: Internal Medicine

## 2017-02-10 MED FILL — $Dulera 200mcg/5 mcg Inh: 200-5 | 30 days supply | Qty: 13 | Fill #0

## 2017-02-11 NOTE — Progress Notes (Signed)
Pharmacy Inhaler Education and Assessment Session  Katrina Mitchell is a 36 y.o. female [facility] asthma education and medication review. The last appointment with was on 09/23/16 with Georgian CoAngela McClung, PA-C. She is in good spirits and presents without assistance. Katrina Mitchell has long-standing asthma. Today, their ACT score is 17, which represents good control of their asthma.       The patient is on the following medications for asthma:  Dulera 200/5 mcg - 2 puffs bid, Albuterol HFA prn      Currently, the patient states that they are taking their asthma medications as prescribed. She states that they miss their medications 1-3 times per week, on average. Of note, the patient is not smoking.      Assessment:  Does the patient feel that his/her medications are working for him/her?  yes      Has the patient been experiencing any side effects to the medications prescribed?  no      Does the patient have an action plan for worsening symptoms?  no - reviewed and provided one today     Does the patient have any problems obtaining medications due to transportation or finances?   no - [facility]     Understanding of regimen: excellent  Understanding of indications: excellent  Potential of compliance: excellent     Plan:  Education: We discussed an asthma action plan. No other interventions were necessary as the patient is using her inhalers properly.   The patient was provided with a printed asthma action plan.   Time spent with patient: 15 minutes    Allie BossierApryl Shareena Nusz, PharmD PGY1 Pharmacy Resident [phone] (Pager) 02/11/2017 6:37 PM

## 2017-02-13 ENCOUNTER — Telehealth: Payer: Self-pay | Admitting: *Deleted

## 2017-02-13 NOTE — Telephone Encounter (Addendum)
Pt left message on voicemail for steroid medication for asthma flare up. State she set up an asthma action plan with pharmacy and was told she could get a steroid when she has flare ups so she wouldn't have to go to the ED as she has done so in the past.  Pt states she has used her Albuterol inhaler today once every 20 minutes for 1 hour. She stated she needed to use a breathing treatment as well. Apt scheduled for 02/14/17 at 1100. Pt verbalized understanding. Guy Francoravia Mansi Tokar, RN

## 2017-02-14 ENCOUNTER — Encounter: Payer: Self-pay | Admitting: Family Medicine

## 2017-02-14 ENCOUNTER — Ambulatory Visit: Payer: Self-pay | Attending: Family Medicine | Admitting: Family Medicine

## 2017-02-14 ENCOUNTER — Ambulatory Visit: Payer: Self-pay

## 2017-02-14 VITALS — BP 117/74 | HR 75 | Temp 97.6°F | Ht 66.5 in | Wt 209.2 lb

## 2017-02-14 DIAGNOSIS — K219 Gastro-esophageal reflux disease without esophagitis: Secondary | ICD-10-CM | POA: Insufficient documentation

## 2017-02-14 DIAGNOSIS — Z13228 Encounter for screening for other metabolic disorders: Secondary | ICD-10-CM

## 2017-02-14 DIAGNOSIS — J0100 Acute maxillary sinusitis, unspecified: Secondary | ICD-10-CM | POA: Insufficient documentation

## 2017-02-14 DIAGNOSIS — K0889 Other specified disorders of teeth and supporting structures: Secondary | ICD-10-CM | POA: Insufficient documentation

## 2017-02-14 DIAGNOSIS — J4540 Moderate persistent asthma, uncomplicated: Secondary | ICD-10-CM

## 2017-02-14 DIAGNOSIS — Z79899 Other long term (current) drug therapy: Secondary | ICD-10-CM | POA: Insufficient documentation

## 2017-02-14 DIAGNOSIS — J4541 Moderate persistent asthma with (acute) exacerbation: Secondary | ICD-10-CM | POA: Insufficient documentation

## 2017-02-14 MED ORDER — PREDNISONE 20 MG PO TABS: 20.0000 mg | ORAL_TABLET | Freq: Two times a day (BID) | ORAL | 0 refills | Status: DC

## 2017-02-14 MED ORDER — FLUTICASONE PROPIONATE 50 MCG/ACT NA SUSP: 1.0000 | Freq: Two times a day (BID) | NASAL | 3 refills | Status: DC

## 2017-02-14 MED ORDER — ALBUTEROL SULFATE HFA 108 (90 BASE) MCG/ACT IN AERS: 1.0000 | INHALATION_SPRAY | Freq: Four times a day (QID) | RESPIRATORY_TRACT | 3 refills | Status: DC | PRN

## 2017-02-14 MED ORDER — AMOXICILLIN 500 MG PO CAPS: 500.0000 mg | ORAL_CAPSULE | Freq: Three times a day (TID) | ORAL | 0 refills | Status: DC

## 2017-02-14 MED ORDER — IPRATROPIUM BROMIDE 0.02 % IN SOLN: 0.5000 mg | Freq: Four times a day (QID) | RESPIRATORY_TRACT | 3 refills | Status: DC | PRN

## 2017-02-14 MED ORDER — MOMETASONE FURO-FORMOTEROL FUM 200-5 MCG/ACT IN AERO: 2.0000 | INHALATION_SPRAY | Freq: Two times a day (BID) | RESPIRATORY_TRACT | 3 refills | Status: DC

## 2017-02-14 MED ORDER — ALBUTEROL SULFATE (2.5 MG/3ML) 0.083% IN NEBU: INHALATION_SOLUTION | RESPIRATORY_TRACT | 1 refills | Status: DC

## 2017-02-14 MED FILL — FLUTICASONE PROP 50 MCG SPR: 50 | 30 days supply | Qty: 16 | Fill #0

## 2017-02-14 MED FILL — IPRATROPIUM BR 0.02% SOLN: 0.02 | 30 days supply | Qty: 3 | Fill #0

## 2017-02-14 MED FILL — ?PREDNISONE 20 MG TABLET: 20 | 5 days supply | Qty: 10 | Fill #0

## 2017-02-14 MED FILL — AMOXICILLIN 500 MG CAPSULE: 500 | 10 days supply | Qty: 30 | Fill #0

## 2017-02-14 MED FILL — ALBUTEROL 0.083% INHAL SOLN: (2.5 MG/3ML | 8 days supply | Qty: 90 | Fill #0

## 2017-02-14 NOTE — Progress Notes (Signed)
Subjective:  Patient ID: Katrina Mitchell, female    DOB: 03/23/1981  Age: 36 y.o. MRN: [id number]  CC: Asthma and dental issues   HPI Katrina Mitchell is a 36 year old female with a history of asthma, GERD who presents today for a follow-up visit and complains of worsening asthma symptoms over the last few days. Has had cough occasionally productive of sputum, nasal congestion, some sinus tenderness and postnasal drip and has been wheezing slightly; this has led to having to use her nebulizers frequently. She endorses compliance with her medications but has had 2 urgent care visits due to asthma exacerbations. Asthma triggers include temperature changes,, doxepin.  She also complains of pain in her right upper premolar and does have multiple dental caries; she is scheduled for extraction of focal upper teeth on 02/22/17 She denies fevers.  Reflux symptoms are controlled on her medications.  Past Medical History:  Diagnosis Date  . Asthma   . Complication of anesthesia   . GERD (gastroesophageal reflux disease)   . Pregnancy as incidental finding     Past Surgical History:  Procedure Laterality Date  . CHOLECYSTECTOMY  2001  . LAPAROSCOPIC TUBAL LIGATION  08/19/2012   Procedure: LAPAROSCOPIC TUBAL LIGATION;  Surgeon: Lazaro Arms, MD;  Location: [facility];  Service: Gynecology;  Laterality: Bilateral;  . lypoma removal  2003   right arm   . NASAL SINUS SURGERY      No Known Allergies   Outpatient Medications Prior to Visit  Medication Sig Dispense Refill  . acetaminophen (TYLENOL) 325 MG tablet Uses as needed for pain    . cetirizine (ZYRTEC) 10 MG tablet Take 10 mg by mouth daily.    Marland Kitchen esomeprazole (NEXIUM) 20 MG capsule Take 20 mg by mouth daily at 12 noon.    . famotidine (PEPCID) 20 MG tablet Take 1 tablet (20 mg total) by mouth at bedtime.    . fexofenadine (ALLEGRA) 60 MG tablet Take 60 mg by mouth 2 (two) times daily.    Marland Kitchen ibuprofen (ADVIL,MOTRIN) 200 MG tablet Per bottle     . albuterol (PROVENTIL HFA;VENTOLIN HFA) 108 (90 Base) MCG/ACT inhaler Inhale 1-2 puffs into the lungs every 6 (six) hours as needed for wheezing. 54 Inhaler 3  . albuterol (PROVENTIL) (2.5 MG/3ML) 0.083% nebulizer solution TAKE 3 MLS BY NEBULIZATION EVERY 6 HOURS AS NEEDED FOR WHEEZING. 90 mL 0  . fluticasone (FLONASE) 50 MCG/ACT nasal spray Place 1 spray into both nostrils 2 (two) times daily. 16 g 3  . ipratropium (ATROVENT) 0.02 % nebulizer solution Take 2.5 mLs (0.5 mg total) by nebulization 4 (four) times daily. 30 mL 2  . mometasone-formoterol (DULERA) 200-5 MCG/ACT AERO Inhale 2 puffs into the lungs 2 (two) times daily. 39 g 3  . azithromycin (ZITHROMAX Z-PAK) 250 MG tablet Take 2 tablets (500 mg) on  Day 1,  followed by 1 tablet (250 mg) once daily on Days 2 through 5. (Patient not taking: Reported on 02/11/2017) 6 each 0  . predniSONE (DELTASONE) 50 MG tablet Take 1 tablet (50 mg total) by mouth daily with breakfast. (Patient not taking: Reported on 02/11/2017) 5 tablet 0   No facility-administered medications prior to visit.     ROS Review of Systems  Constitutional: Negative for activity change, appetite change and fatigue.  HENT: Positive for dental problem and postnasal drip. Negative for congestion, sinus pressure and sore throat.   Eyes: Negative for visual disturbance.  Respiratory: Positive for cough and wheezing. Negative  for chest tightness and shortness of breath.   Cardiovascular: Negative for chest pain and palpitations.  Gastrointestinal: Negative for abdominal distention, abdominal pain and constipation.  Endocrine: Negative for polydipsia.  Genitourinary: Negative for dysuria and frequency.  Musculoskeletal: Negative for arthralgias and back pain.  Skin: Negative for rash.  Neurological: Negative for tremors, light-headedness and numbness.  Hematological: Does not bruise/bleed easily.  Psychiatric/Behavioral: Negative for agitation and behavioral problems.     Objective:  BP 117/74 (BP Location: Right Arm, Patient Position: Sitting, Cuff Size: Small)   Pulse 75   Temp 97.6 F (36.4 C) (Oral)   Ht 5' 6.5" (1.689 m)   Wt 209 lb 3.2 oz (94.9 kg)   BMI 33.26 kg/m   BP/Weight 02/14/2017 12/24/2016 11/19/2016  Systolic BP 117 138 142  Diastolic BP 74 89 95  Wt. (Lbs) 209.2 200 -  BMI 33.26 31.32 -      Physical Exam  Constitutional: She is oriented to person, place, and time. She appears well-developed and well-nourished.  HENT:  Right Ear: External ear normal.  Left Ear: External ear normal.  Mild inflammation of oropharynx with postnasal drainage  Neck:  Right submandibular lymphadenopathy  Cardiovascular: Normal rate, normal heart sounds and intact distal pulses.   No murmur heard. Pulmonary/Chest: Effort normal. She has wheezes (slight expiratory wheezing bilaterally). She has no rales. She exhibits no tenderness.  Abdominal: Soft. Bowel sounds are normal. She exhibits no distension and no mass. There is no tenderness.  Musculoskeletal: Normal range of motion.  Neurological: She is alert and oriented to person, place, and time.  Skin: Skin is warm and dry.  Psychiatric: She has a normal mood and affect.     Assessment & Plan:   1. Moderate persistent asthma with acute exacerbation Underlying sinusitis currently driving asthma exacerbation - albuterol (PROVENTIL) (2.5 MG/3ML) 0.083% nebulizer solution; TAKE 3 MLS BY NEBULIZATION EVERY 6 HOURS AS NEEDED FOR WHEEZING.  Dispense: 75 mL; Refill: 1 - predniSONE (DELTASONE) 20 MG tablet; Take 1 tablet (20 mg total) by mouth 2 (two) times daily with a meal.  Dispense: 10 tablet; Refill: 0 - amoxicillin (AMOXIL) 500 MG capsule; Take 1 capsule (500 mg total) by mouth 3 (three) times daily.  Dispense: 30 capsule; Refill: 0  2. Moderate persistent asthma, uncomplicated We'll assess her at her next visit for the need to upgrade treatment especially with season change If symptoms  controlled we'll add Singulair to her regimen. - mometasone-formoterol (DULERA) 200-5 MCG/ACT AERO; Inhale 2 puffs into the lungs 2 (two) times daily.  Dispense: 39 g; Refill: 3 - ipratropium (ATROVENT) 0.02 % nebulizer solution; Take 2.5 mLs (0.5 mg total) by nebulization every 6 (six) hours as needed for wheezing or shortness of breath.  Dispense: 30 mL; Refill: 3 - fluticasone (FLONASE) 50 MCG/ACT nasal spray; Place 1 spray into both nostrils 2 (two) times daily.  Dispense: 16 g; Refill: 3 - albuterol (PROVENTIL HFA;VENTOLIN HFA) 108 (90 Base) MCG/ACT inhaler; Inhale 1-2 puffs into the lungs every 6 (six) hours as needed for wheezing.  Dispense: 54 Inhaler; Refill: 3  3. Acute non-recurrent maxillary sinusitis Placed on amoxicillin  4. Toothache We'll treat with amoxicillin Meanwhile keep appointment with oral surgeon for extraction on 02/22/17  5. Screening for metabolic disorder - COMPLETE METABOLIC PANEL WITH GFR; Future - Lipid panel; Future   Meds ordered this encounter  Medications  . albuterol (PROVENTIL) (2.5 MG/3ML) 0.083% nebulizer solution    Sig: TAKE 3 MLS BY NEBULIZATION EVERY 6  HOURS AS NEEDED FOR WHEEZING.    Dispense:  75 mL    Refill:  1  . mometasone-formoterol (DULERA) 200-5 MCG/ACT AERO    Sig: Inhale 2 puffs into the lungs 2 (two) times daily.    Dispense:  39 g    Refill:  3  . ipratropium (ATROVENT) 0.02 % nebulizer solution    Sig: Take 2.5 mLs (0.5 mg total) by nebulization every 6 (six) hours as needed for wheezing or shortness of breath.    Dispense:  30 mL    Refill:  3  . fluticasone (FLONASE) 50 MCG/ACT nasal spray    Sig: Place 1 spray into both nostrils 2 (two) times daily.    Dispense:  16 g    Refill:  3  . albuterol (PROVENTIL HFA;VENTOLIN HFA) 108 (90 Base) MCG/ACT inhaler    Sig: Inhale 1-2 puffs into the lungs every 6 (six) hours as needed for wheezing.    Dispense:  54 Inhaler    Refill:  3  . predniSONE (DELTASONE) 20 MG tablet     Sig: Take 1 tablet (20 mg total) by mouth 2 (two) times daily with a meal.    Dispense:  10 tablet    Refill:  0  . amoxicillin (AMOXIL) 500 MG capsule    Sig: Take 1 capsule (500 mg total) by mouth 3 (three) times daily.    Dispense:  30 capsule    Refill:  0    Follow-up: Return in about 1 month (around 03/17/2017) for Follow-up on asthma.   Jaclyn ShaggyEnobong Amao MD

## 2017-02-21 ENCOUNTER — Ambulatory Visit: Payer: Self-pay | Attending: Family Medicine

## 2017-02-21 DIAGNOSIS — Z13228 Encounter for screening for other metabolic disorders: Secondary | ICD-10-CM | POA: Insufficient documentation

## 2017-02-21 LAB — COMPLETE METABOLIC PANEL WITH GFR
ALBUMIN: 3.9 g/dL (ref 3.6–5.1)
ALK PHOS: 53 U/L (ref 33–115)
ALT: 14 U/L (ref 6–29)
AST: 14 U/L (ref 10–30)
BILIRUBIN TOTAL: 0.3 mg/dL (ref 0.2–1.2)
BUN: 10 mg/dL (ref 7–25)
CO2: 29 mmol/L (ref 20–31)
CREATININE: 0.64 mg/dL (ref 0.50–1.10)
Calcium: 9.1 mg/dL (ref 8.6–10.2)
Chloride: 103 mmol/L (ref 98–110)
GLUCOSE: 83 mg/dL (ref 65–99)
Potassium: 4.6 mmol/L (ref 3.5–5.3)
Sodium: 139 mmol/L (ref 135–146)
TOTAL PROTEIN: 6.9 g/dL (ref 6.1–8.1)

## 2017-02-21 LAB — LIPID PANEL
Cholesterol: 214 mg/dL — ABNORMAL HIGH (ref <200)
HDL: 42 mg/dL — AB (ref >50)
LDL CALC: 132 mg/dL — AB (ref <100)
TRIGLYCERIDES: 202 mg/dL — AB (ref <150)
Total CHOL/HDL Ratio: 5.1 Ratio — ABNORMAL HIGH (ref <5.0)
VLDL: 40 mg/dL — ABNORMAL HIGH (ref <30)

## 2017-03-11 ENCOUNTER — Telehealth: Payer: Self-pay

## 2017-03-11 NOTE — Telephone Encounter (Signed)
Writer called patient with lab test results.  LVM asking patient to return the call to discuss.

## 2017-03-11 NOTE — Telephone Encounter (Signed)
-----   Message from Enobong Amao, MD sent at 02/21/2017  4:53 PM EDT ----- Cholesterol is elevated but slightly improved compared to last year. Work on low-cholesterol diet, exercise 

## 2017-03-11 NOTE — Telephone Encounter (Signed)
-----   Message from Jaclyn Shaggy, MD sent at 02/21/2017  4:53 PM EDT ----- Cholesterol is elevated but slightly improved compared to last year. Work on [employer], exercise

## 2017-03-11 NOTE — Telephone Encounter (Signed)
Writer discussed lab results with patient.  Patient stated understanding.

## 2017-03-19 ENCOUNTER — Other Ambulatory Visit: Payer: Self-pay | Admitting: Pharmacist

## 2017-03-19 MED ORDER — PROAIR HFA 108 (90 BASE) MCG/ACT IN AERS: 1.0000 | INHALATION_SPRAY | Freq: Four times a day (QID) | RESPIRATORY_TRACT | 2 refills | Status: DC | PRN

## 2017-03-20 ENCOUNTER — Encounter: Payer: Self-pay | Admitting: Family Medicine

## 2017-03-20 ENCOUNTER — Ambulatory Visit: Payer: Self-pay | Attending: Family Medicine | Admitting: Family Medicine

## 2017-03-20 VITALS — BP 122/74 | HR 77 | Temp 97.7°F | Ht 67.0 in | Wt 206.2 lb

## 2017-03-20 DIAGNOSIS — L308 Other specified dermatitis: Secondary | ICD-10-CM

## 2017-03-20 DIAGNOSIS — N898 Other specified noninflammatory disorders of vagina: Secondary | ICD-10-CM | POA: Insufficient documentation

## 2017-03-20 DIAGNOSIS — L309 Dermatitis, unspecified: Secondary | ICD-10-CM | POA: Insufficient documentation

## 2017-03-20 DIAGNOSIS — Z79899 Other long term (current) drug therapy: Secondary | ICD-10-CM | POA: Insufficient documentation

## 2017-03-20 DIAGNOSIS — J4541 Moderate persistent asthma with (acute) exacerbation: Secondary | ICD-10-CM | POA: Insufficient documentation

## 2017-03-20 MED ORDER — TRIAMCINOLONE ACETONIDE 0.1 % EX CREA: 1.0000 "application " | TOPICAL_CREAM | Freq: Two times a day (BID) | CUTANEOUS | 1 refills | Status: DC

## 2017-03-20 MED FILL — PROAIR HFA 90 MCG INHALER: 108 (90 BAS | 25 days supply | Qty: 9 | Fill #0

## 2017-03-20 NOTE — Progress Notes (Signed)
Subjective:  Patient ID: Katrina Mitchell, female    DOB: Mar 04, 1981  Age: 36 y.o. MRN: [id number]  CC: Follow-up; Asthma; Eczema; and Vaginal Discharge (was exposed to trichamonis)   HPI Katrina Mitchell is a 36 year old female with a history of asthma, eczema who presents today for follow-up visit. Treated for sinusitis and an asthma plan at her last office visit. She reports asthma has been well controlled and denies any recent flares.  She complains of slight vaginal discharge which is light yellow, with a fishy odor. Denies urinary symptoms or fever but does have some lower abdominal burning. Recently had unprotected sexual intercourse with her partner who has had a previous history of Trichomonas. No history of STD at this time from her partner or herself.  Complains of flaring up of eczema on her right hand and would like to have a cream for this.  Past Medical History:  Diagnosis Date  . Asthma   . Complication of anesthesia   . GERD (gastroesophageal reflux disease)   . Pregnancy as incidental finding     Past Surgical History:  Procedure Laterality Date  . CHOLECYSTECTOMY  2001  . LAPAROSCOPIC TUBAL LIGATION  08/19/2012   Procedure: LAPAROSCOPIC TUBAL LIGATION;  Surgeon: Lazaro Arms, MD;  Location: [facility];  Service: Gynecology;  Laterality: Bilateral;  . lypoma removal  2003   right arm   . NASAL SINUS SURGERY      No Known Allergies   Outpatient Medications Prior to Visit  Medication Sig Dispense Refill  . acetaminophen (TYLENOL) 325 MG tablet Uses as needed for pain    . albuterol (PROVENTIL) (2.5 MG/3ML) 0.083% nebulizer solution TAKE 3 MLS BY NEBULIZATION EVERY 6 HOURS AS NEEDED FOR WHEEZING. 75 mL 1  . cetirizine (ZYRTEC) 10 MG tablet Take 10 mg by mouth daily.    Marland Kitchen esomeprazole (NEXIUM) 20 MG capsule Take 20 mg by mouth daily at 12 noon.    . famotidine (PEPCID) 20 MG tablet Take 1 tablet (20 mg total) by mouth at bedtime.    . fexofenadine (ALLEGRA) 60 MG  tablet Take 60 mg by mouth 2 (two) times daily.    . fluticasone (FLONASE) 50 MCG/ACT nasal spray Place 1 spray into both nostrils 2 (two) times daily. 16 g 3  . ibuprofen (ADVIL,MOTRIN) 200 MG tablet Per bottle    . ipratropium (ATROVENT) 0.02 % nebulizer solution Take 2.5 mLs (0.5 mg total) by nebulization every 6 (six) hours as needed for wheezing or shortness of breath. 30 mL 3  . mometasone-formoterol (DULERA) 200-5 MCG/ACT AERO Inhale 2 puffs into the lungs 2 (two) times daily. 39 g 3  . PROAIR HFA 108 (90 Base) MCG/ACT inhaler Inhale 1-2 puffs into the lungs every 6 (six) hours as needed for wheezing or shortness of breath. 1 Inhaler 2  . amoxicillin (AMOXIL) 500 MG capsule Take 1 capsule (500 mg total) by mouth 3 (three) times daily. 30 capsule 0  . predniSONE (DELTASONE) 20 MG tablet Take 1 tablet (20 mg total) by mouth 2 (two) times daily with a meal. 10 tablet 0   No facility-administered medications prior to visit.     ROS Review of Systems  Constitutional: Negative for activity change, appetite change and fatigue.  HENT: Negative for congestion, sinus pressure and sore throat.   Eyes: Negative for visual disturbance.  Respiratory: Negative for cough, chest tightness, shortness of breath and wheezing.   Cardiovascular: Negative for chest pain and palpitations.  Gastrointestinal: Positive  for abdominal pain. Negative for abdominal distention and constipation.  Endocrine: Negative for polydipsia.  Genitourinary:       See hpi  Musculoskeletal: Negative for arthralgias and back pain.  Skin: Positive for rash.  Neurological: Negative for tremors, light-headedness and numbness.  Hematological: Does not bruise/bleed easily.  Psychiatric/Behavioral: Negative for agitation and behavioral problems.    Objective:  BP 122/74 (BP Location: Right Arm, Patient Position: Sitting, Cuff Size: Small)   Pulse 77   Temp 97.7 F (36.5 C) (Oral)   Ht  (1.702 m)   Wt 206 lb 3.2 oz (93.5  kg)   SpO2 100%   BMI 32.30 kg/m   BP/Weight 03/20/2017 02/14/2017 12/24/2016  Systolic BP 122 117 138  Diastolic BP 74 74 89  Wt. (Lbs) 206.2 209.2 200  BMI 32.3 33.26 31.32      Physical Exam  Constitutional: She is oriented to person, place, and time. She appears well-developed and well-nourished.  Cardiovascular: Normal rate, normal heart sounds and intact distal pulses.   No murmur heard. Pulmonary/Chest: Effort normal and breath sounds normal. She has no wheezes. She has no rales. She exhibits no tenderness.  Abdominal: Soft. Bowel sounds are normal. She exhibits no distension and no mass. There is no tenderness.  Genitourinary:  Genitourinary Comments: Normal external genitalia Normal vagina, no discharge observed  Musculoskeletal: Normal range of motion.  Neurological: She is alert and oriented to person, place, and time.  Skin:  Scaly rash on right hand in between second and third web spaces     Assessment & Plan:   1. Moderate persistent asthma with acute exacerbation Stable Continue Dulera and rescue inhaler Continue Flonase and Allegra for seasonal allergies  2. Other eczema - triamcinolone cream (KENALOG) 0.1 %; Apply 1 application topically 2 (two) times daily.  Dispense: 30 g; Refill: 1  3. Vaginal discharge Sits sexual practices discussed. - Cervicovaginal ancillary only - HIV antibody (with reflex)   Meds ordered this encounter  Medications  . triamcinolone cream (KENALOG) 0.1 %    Sig: Apply 1 application topically 2 (two) times daily.    Dispense:  30 g    Refill:  1    Follow-up: Return in about 3 months (around 06/19/2017) for Follow-up on asthma.   Jaclyn Shaggy MD

## 2017-03-21 LAB — CERVICOVAGINAL ANCILLARY ONLY
CHLAMYDIA, DNA PROBE: NEGATIVE
NEISSERIA GONORRHEA: NEGATIVE
Wet Prep (BD Affirm): POSITIVE — AB

## 2017-03-21 LAB — HIV ANTIBODY (ROUTINE TESTING W REFLEX): HIV SCREEN 4TH GENERATION: NONREACTIVE

## 2017-03-24 ENCOUNTER — Other Ambulatory Visit: Payer: Self-pay | Admitting: Family Medicine

## 2017-03-24 ENCOUNTER — Telehealth: Payer: Self-pay | Admitting: *Deleted

## 2017-03-24 MED ORDER — METRONIDAZOLE 0.75 % VA GEL: 1.0000 | Freq: Every day | VAGINAL | 0 refills | Status: DC

## 2017-03-24 MED FILL — VANDAZOLE VAGINAL 0.75% GEL: 0.75 | 5 days supply | Qty: 70 | Fill #0

## 2017-03-24 NOTE — Telephone Encounter (Signed)
-----   Message from Jaclyn Shaggy, MD sent at 03/24/2017  1:27 PM EDT ----- Cultures were negative for STDs but revealed bacterial vaginosis which is not an STD, it is a bacteria infection that occurs when the pH of the vagina changes. I have sent a prescription for MetroGel to the pharmacy.

## 2017-03-24 NOTE — Telephone Encounter (Signed)
Patient verified DOB Patient is aware of STD screening being negative. Patient is aware of BV being present and a gel being prescribed to the [facility] pharmacy. Patient had no further questions after expressing her understanding.

## 2017-03-26 MED FILL — TRIAMCINOLONE 0.1% CREAM: 0.1 | 15 days supply | Qty: 30 | Fill #0

## 2017-04-14 ENCOUNTER — Encounter: Payer: Self-pay | Admitting: Family Medicine

## 2017-04-14 ENCOUNTER — Ambulatory Visit: Payer: Self-pay | Attending: Family Medicine | Admitting: Family Medicine

## 2017-04-14 VITALS — BP 105/67 | HR 97 | Temp 98.3°F | Resp 18 | Ht 67.0 in | Wt 200.4 lb

## 2017-04-14 DIAGNOSIS — K219 Gastro-esophageal reflux disease without esophagitis: Secondary | ICD-10-CM | POA: Insufficient documentation

## 2017-04-14 DIAGNOSIS — Z7951 Long term (current) use of inhaled steroids: Secondary | ICD-10-CM | POA: Insufficient documentation

## 2017-04-14 DIAGNOSIS — Z79899 Other long term (current) drug therapy: Secondary | ICD-10-CM | POA: Insufficient documentation

## 2017-04-14 DIAGNOSIS — L309 Dermatitis, unspecified: Secondary | ICD-10-CM | POA: Insufficient documentation

## 2017-04-14 DIAGNOSIS — J4541 Moderate persistent asthma with (acute) exacerbation: Secondary | ICD-10-CM | POA: Insufficient documentation

## 2017-04-14 MED ORDER — PREDNISONE 20 MG PO TABS: 20.0000 mg | ORAL_TABLET | Freq: Two times a day (BID) | ORAL | 0 refills | Status: DC

## 2017-04-14 NOTE — Progress Notes (Signed)
Subjective:  Patient ID: Katrina Mitchell, female    DOB: 06/13/1981  Age: 36 y.o. MRN: [id number]  CC: Asthma   HPI Katrina Mitchell a 36 year old female with a history of asthma, eczema who presents today with complaints of asthma exacerbation ever since she used chemicals to treat lice in her head. She has had some wheezing and has had to use her nebulizer treatments more often. Endorses postnasal drip as well but no fever. Using her Proventil and Dulera.  Past Medical History:  Diagnosis Date  . Asthma   . Complication of anesthesia   . GERD (gastroesophageal reflux disease)   . Pregnancy as incidental finding     Past Surgical History:  Procedure Laterality Date  . CHOLECYSTECTOMY  2001  . LAPAROSCOPIC TUBAL LIGATION  08/19/2012   Procedure: LAPAROSCOPIC TUBAL LIGATION;  Surgeon: Lazaro ArmsLuther H Eure, MD;  Location: [facility];  Service: Gynecology;  Laterality: Bilateral;  . lypoma removal  2003   right arm   . NASAL SINUS SURGERY      No Known Allergies    Outpatient Medications Prior to Visit  Medication Sig Dispense Refill  . acetaminophen (TYLENOL) 325 MG tablet Uses as needed for pain    . albuterol (PROVENTIL) (2.5 MG/3ML) 0.083% nebulizer solution TAKE 3 MLS BY NEBULIZATION EVERY 6 HOURS AS NEEDED FOR WHEEZING. 75 mL 1  . cetirizine (ZYRTEC) 10 MG tablet Take 10 mg by mouth daily.    Marland Kitchen. esomeprazole (NEXIUM) 20 MG capsule Take 20 mg by mouth daily at 12 noon.    . famotidine (PEPCID) 20 MG tablet Take 1 tablet (20 mg total) by mouth at bedtime.    . fexofenadine (ALLEGRA) 60 MG tablet Take 60 mg by mouth 2 (two) times daily.    . fluticasone (FLONASE) 50 MCG/ACT nasal spray Place 1 spray into both nostrils 2 (two) times daily. 16 g 3  . ibuprofen (ADVIL,MOTRIN) 200 MG tablet Per bottle    . ipratropium (ATROVENT) 0.02 % nebulizer solution Take 2.5 mLs (0.5 mg total) by nebulization every 6 (six) hours as needed for wheezing or shortness of breath. 30 mL 3  .  mometasone-formoterol (DULERA) 200-5 MCG/ACT AERO Inhale 2 puffs into the lungs 2 (two) times daily. 39 g 3  . PROAIR HFA 108 (90 Base) MCG/ACT inhaler Inhale 1-2 puffs into the lungs every 6 (six) hours as needed for wheezing or shortness of breath. 1 Inhaler 2  . triamcinolone cream (KENALOG) 0.1 % Apply 1 application topically 2 (two) times daily. 30 g 1  . metroNIDAZOLE (METROGEL VAGINAL) 0.75 % vaginal gel Place 1 Applicatorful vaginally at bedtime. 70 g 0   No facility-administered medications prior to visit.     ROS Review of Systems  Constitutional: Negative for activity change and appetite change.  HENT: Positive for postnasal drip. Negative for sinus pressure and sore throat.   Respiratory: Positive for cough, shortness of breath and wheezing. Negative for chest tightness.   Cardiovascular: Negative for chest pain and palpitations.  Gastrointestinal: Negative for abdominal distention, abdominal pain and constipation.  Genitourinary: Negative.   Musculoskeletal: Negative.   Psychiatric/Behavioral: Negative for behavioral problems and dysphoric mood.    Objective:  BP 105/67 (BP Location: Left Arm, Patient Position: Sitting, Cuff Size: Large)   Pulse 97   Temp 98.3 F (36.8 C) (Oral)   Resp 18   Ht 5\' 7"  (1.702 m)   Wt 200 lb 6.4 oz (90.9 kg)   LMP 04/02/2017   SpO2  100%   BMI 31.39 kg/m   BP/Weight 04/14/2017 03/20/2017 02/14/2017  Systolic BP 105 122 117  Diastolic BP 67 74 74  Wt. (Lbs) 200.4 206.2 209.2  BMI 31.39 32.3 33.26      Physical Exam  Constitutional: She is oriented to person, place, and time. She appears well-developed and well-nourished.  HENT:  Oropharyngeal erythema with postnasal drip  Cardiovascular: Normal rate, normal heart sounds and intact distal pulses.   No murmur heard. Pulmonary/Chest: Effort normal and breath sounds normal. She has no wheezes. She has no rales. She exhibits no tenderness.  Abdominal: Soft. Bowel sounds are normal. She  exhibits no distension and no mass. There is no tenderness.  Musculoskeletal: Normal range of motion.  Neurological: She is alert and oriented to person, place, and time.     Assessment & Plan:   1. Moderate persistent asthma with acute exacerbation Acute flare triggered by pollen and use of chemicals Will place on short course of prednisone Continue controller medication and rescue medication. - predniSONE (DELTASONE) 20 MG tablet; Take 1 tablet (20 mg total) by mouth 2 (two) times daily with a meal.  Dispense: 10 tablet; Refill: 0   Meds ordered this encounter  Medications  . predniSONE (DELTASONE) 20 MG tablet    Sig: Take 1 tablet (20 mg total) by mouth 2 (two) times daily with a meal.    Dispense:  10 tablet    Refill:  0    Follow-up: Return for follow up of asthma, keep previously scheduled appointment.   Jaclyn Shaggy MD

## 2017-04-14 NOTE — Patient Instructions (Signed)

## 2017-04-14 NOTE — Progress Notes (Signed)
Patient is here for Asthma  Patient has taken medication today.

## 2017-04-24 ENCOUNTER — Encounter: Payer: Self-pay | Admitting: Family Medicine

## 2017-04-24 ENCOUNTER — Ambulatory Visit: Payer: Self-pay | Attending: Family Medicine | Admitting: Family Medicine

## 2017-04-24 VITALS — BP 111/68 | HR 66 | Temp 98.2°F | Resp 18 | Ht 67.0 in | Wt 202.0 lb

## 2017-04-24 DIAGNOSIS — B370 Candidal stomatitis: Secondary | ICD-10-CM

## 2017-04-24 DIAGNOSIS — Z79899 Other long term (current) drug therapy: Secondary | ICD-10-CM | POA: Insufficient documentation

## 2017-04-24 DIAGNOSIS — J45909 Unspecified asthma, uncomplicated: Secondary | ICD-10-CM | POA: Insufficient documentation

## 2017-04-24 DIAGNOSIS — K219 Gastro-esophageal reflux disease without esophagitis: Secondary | ICD-10-CM | POA: Insufficient documentation

## 2017-04-24 DIAGNOSIS — Z7951 Long term (current) use of inhaled steroids: Secondary | ICD-10-CM | POA: Insufficient documentation

## 2017-04-24 MED ORDER — LIDOCAINE VISCOUS 2 % MT SOLN: 15.0000 mL | OROMUCOSAL | 0 refills | Status: DC | PRN

## 2017-04-24 MED ORDER — FLUCONAZOLE 150 MG PO TABS: 150.0000 mg | ORAL_TABLET | Freq: Every day | ORAL | 0 refills | Status: DC

## 2017-04-24 MED FILL — LIDOCAINE 2% VISCOUS SOLN: 2 | 6 days supply | Qty: 100 | Fill #0

## 2017-04-24 MED FILL — FLUCONAZOLE 150 MG TABLET: 150 | 7 days supply | Qty: 7 | Fill #0

## 2017-04-24 NOTE — Progress Notes (Signed)
Patient is here for Mouth concerns.  Patient complains of tongue swelling and burning over the past 3 days. Patient rinsed with salt water and only experienced excessive burning with no relief. Burning is scaled at a 4 currently.  Patient has taken medication today. Patient has eaten today.

## 2017-04-24 NOTE — Patient Instructions (Signed)
Oral Thrush, Adult Oral thrush is an infection in your mouth and throat. It causes white patches on your tongue and in your mouth. Follow these instructions at home: Helping with soreness  To lessen your pain: ? Drink cold liquids, like water and iced tea. ? Eat frozen ice pops or frozen juices. ? Eat foods that are easy to swallow, like gelatin and ice cream. ? Drink from a straw if the patches in your mouth are painful. General instructions   Take or use over-the-counter and prescription medicines only as told by your doctor. Medicine for oral thrush may be something to swallow, or it may be something to put on the infected area.  Eat plain yogurt that has live cultures in it. Read the label to make sure.  If you wear dentures: ? Take out your dentures before you go to bed. ? Brush them well. ? Soak them in a denture cleaner.  Rinse your mouth with warm salt-water many times a day. To make the salt-water mixture, completely dissolve 1/2-1 teaspoon of salt in 1 cup of warm water. Contact a doctor if:  Your problems are getting worse.  Your problems do not get better in less than 7 days with treatment.  Your infection is spreading. This may show as white patches on the skin outside of your mouth.  You are nursing your baby and you have redness and pain in the nipples. This information is not intended to replace advice given to you by your health care provider. Make sure you discuss any questions you have with your health care provider. Document Released: 02/19/2010 Document Revised: 08/19/2016 Document Reviewed: 08/19/2016 Elsevier Interactive Patient Education  2017 [employer]  

## 2017-04-24 NOTE — Progress Notes (Signed)
Subjective:  Patient ID: Katrina Mitchell, female    DOB: 10/08/1981  Age: 36 y.o. MRN: [id number]  CC: Oral Swelling   HPI Katrina LimesSarah E Mitchell is a 36 year old female with a history of asthma, eczema who was treated for asthma exacerbation at her last office visit with prednisone and presents today complaining of "redness of her mouth" with associated "bumpy" feeling of her tongue for the last couple of days. She describes this as her "mouth being on fire". Denies presence of dyspnea, lip swelling, rash on any body parts or fever. She denies allergies to foods or creams and denies any recent introduction of new products.   Past Medical History:  Diagnosis Date  . Asthma   . Complication of anesthesia   . GERD (gastroesophageal reflux disease)   . Pregnancy as incidental finding     Past Surgical History:  Procedure Laterality Date  . CHOLECYSTECTOMY  2001  . LAPAROSCOPIC TUBAL LIGATION  08/19/2012   Procedure: LAPAROSCOPIC TUBAL LIGATION;  Surgeon: Lazaro ArmsLuther H Eure, MD;  Location: [facility];  Service: Gynecology;  Laterality: Bilateral;  . lypoma removal  2003   right arm   . NASAL SINUS SURGERY      No Known Allergies   Outpatient Medications Prior to Visit  Medication Sig Dispense Refill  . acetaminophen (TYLENOL) 325 MG tablet Uses as needed for pain    . albuterol (PROVENTIL) (2.5 MG/3ML) 0.083% nebulizer solution TAKE 3 MLS BY NEBULIZATION EVERY 6 HOURS AS NEEDED FOR WHEEZING. 75 mL 1  . cetirizine (ZYRTEC) 10 MG tablet Take 10 mg by mouth daily.    Marland Kitchen. esomeprazole (NEXIUM) 20 MG capsule Take 20 mg by mouth daily at 12 noon.    . famotidine (PEPCID) 20 MG tablet Take 1 tablet (20 mg total) by mouth at bedtime.    . fexofenadine (ALLEGRA) 60 MG tablet Take 60 mg by mouth 2 (two) times daily.    . fluticasone (FLONASE) 50 MCG/ACT nasal spray Place 1 spray into both nostrils 2 (two) times daily. 16 g 3  . ibuprofen (ADVIL,MOTRIN) 200 MG tablet Per bottle    . ipratropium (ATROVENT)  0.02 % nebulizer solution Take 2.5 mLs (0.5 mg total) by nebulization every 6 (six) hours as needed for wheezing or shortness of breath. 30 mL 3  . mometasone-formoterol (DULERA) 200-5 MCG/ACT AERO Inhale 2 puffs into the lungs 2 (two) times daily. 39 g 3  . predniSONE (DELTASONE) 20 MG tablet Take 1 tablet (20 mg total) by mouth 2 (two) times daily with a meal. 10 tablet 0  . PROAIR HFA 108 (90 Base) MCG/ACT inhaler Inhale 1-2 puffs into the lungs every 6 (six) hours as needed for wheezing or shortness of breath. 1 Inhaler 2  . triamcinolone cream (KENALOG) 0.1 % Apply 1 application topically 2 (two) times daily. 30 g 1   No facility-administered medications prior to visit.     ROS Review of Systems  Constitutional: Negative for activity change and appetite change.  HENT: Negative for mouth sores, sinus pressure and sore throat.   Respiratory: Negative for chest tightness, shortness of breath and wheezing.   Cardiovascular: Negative for chest pain and palpitations.  Gastrointestinal: Negative for abdominal distention, abdominal pain and constipation.  Genitourinary: Negative.   Musculoskeletal: Negative.   Psychiatric/Behavioral: Negative for behavioral problems and dysphoric mood.    Objective:  BP 111/68 (BP Location: Left Arm, Patient Position: Sitting, Cuff Size: Large)   Pulse 66   Temp 98.2 F (36.8  C) (Oral)   Resp 18   Ht 5\' 7"  (1.702 m)   Wt 202 lb (91.6 kg)   LMP 04/02/2017   SpO2 100%   BMI 31.64 kg/m   BP/Weight 04/24/2017 04/14/2017 03/20/2017  Systolic BP 111 105 122  Diastolic BP 68 67 74  Wt. (Lbs) 202 200.4 206.2  BMI 31.64 31.39 32.3      Physical Exam  Constitutional: She is oriented to person, place, and time. She appears well-developed and well-nourished.  HENT:  Thrush covering hard palate, oropharynx, buccal mucosa and hypertrophic papillae on tongue  Cardiovascular: Normal rate, normal heart sounds and intact distal pulses.   No murmur  heard. Pulmonary/Chest: Effort normal and breath sounds normal. She has no wheezes. She has no rales. She exhibits no tenderness.  Abdominal: Soft. Bowel sounds are normal. She exhibits no distension and no mass. There is no tenderness.  Musculoskeletal: Normal range of motion.  Neurological: She is alert and oriented to person, place, and time.     Assessment & Plan:   1. Oral thrush Likely secondary to recent steroid use. - fluconazole (DIFLUCAN) 150 MG tablet; Take 1 tablet (150 mg total) by mouth daily.  Dispense: 7 tablet; Refill: 0 - lidocaine (XYLOCAINE) 2 % solution; Use as directed 15 mLs in the mouth or throat as needed for mouth pain.  Dispense: 100 mL; Refill: 0   Meds ordered this encounter  Medications  . fluconazole (DIFLUCAN) 150 MG tablet    Sig: Take 1 tablet (150 mg total) by mouth daily.    Dispense:  7 tablet    Refill:  0  . lidocaine (XYLOCAINE) 2 % solution    Sig: Use as directed 15 mLs in the mouth or throat as needed for mouth pain.    Dispense:  100 mL    Refill:  0    Follow-up: Return if symptoms worsen or fail to improve, for follow up of Asthma, keep previously scheduled appointment.   Jaclyn Shaggy MD

## 2017-05-01 ENCOUNTER — Emergency Department
Admission: EM | Admit: 2017-05-01 | Discharge: 2017-05-01 | Disposition: A | Discharge status: Home / Self Care | Payer: Self-pay | Attending: Student in an Organized Health Care Education/Training Program | Admitting: Student in an Organized Health Care Education/Training Program

## 2017-05-01 ENCOUNTER — Telehealth: Payer: Self-pay | Admitting: Family Medicine

## 2017-05-01 ENCOUNTER — Encounter: Payer: Self-pay | Admitting: Emergency Medicine

## 2017-05-01 DIAGNOSIS — Z87891 Personal history of nicotine dependence: Secondary | ICD-10-CM | POA: Insufficient documentation

## 2017-05-01 DIAGNOSIS — K121 Other forms of stomatitis: Secondary | ICD-10-CM | POA: Insufficient documentation

## 2017-05-01 DIAGNOSIS — J45909 Unspecified asthma, uncomplicated: Secondary | ICD-10-CM | POA: Insufficient documentation

## 2017-05-01 DIAGNOSIS — K120 Recurrent oral aphthae: Secondary | ICD-10-CM

## 2017-05-01 DIAGNOSIS — Z79899 Other long term (current) drug therapy: Secondary | ICD-10-CM | POA: Insufficient documentation

## 2017-05-01 MED ORDER — DEXAMETHASONE 0.5 MG/5ML PO SOLN: 0.5000 mg | Freq: Every day | ORAL | 0 refills | Status: DC

## 2017-05-01 MED ORDER — LIDOCAINE VISCOUS 2 % MT SOLN: 5.0000 mL | Freq: Four times a day (QID) | OROMUCOSAL | 0 refills | Status: DC | PRN

## 2017-05-01 NOTE — Telephone Encounter (Addendum)
Pt denies SOB or trouble breathing.  She describe red dots on the palentine of mouth. She states she is leaving ED and was prescribed mouth wash to help with sx's.   She also noted  Red splotches in pubic area after taking diflucan on the 2nd day. Denies itching or irritation presently. Advised to call office for f/u if no improvement.

## 2017-05-01 NOTE — ED Triage Notes (Signed)
Pt presents with thrush to mouth, states her dr started her diflucan and has not help any. Her doctor did not have any appts today so they told her to come here.

## 2017-05-01 NOTE — ED Notes (Signed)
Patient with red sores to top of mouth. Reports recently starting to wear dentures and being diagnosed with thrush. No relief from abx treatment. No fever.

## 2017-05-01 NOTE — ED Provider Notes (Signed)
[facility] Emergency Department Provider Note   ____________________________________________   First MD Initiated Contact with Patient 05/01/17 1034     (approximate)  I have reviewed the triage vital signs and the nursing notes.   HISTORY  Chief Complaint  Katrina Mitchell    HPI Katrina Mitchell is a 36 y.o. female patient complain of pain to the wrist of her mouth. Patient state she discussed her doctor a week ago a translator that she had thrush in place of Diflucan 150 mg daily for 7 days. Patient stated no relief with this medication. Patient recently was fitted for new upper dentures and has been unable to wear them on a regular basis secondary to the pain and upper mouth.   Past Medical History:  Diagnosis Date  . Asthma   . Complication of anesthesia   . GERD (gastroesophageal reflux disease)   . Pregnancy as incidental finding     Patient Active Problem List   Diagnosis Date Noted  . Eczema 03/20/2017  . Cervicogenic headache 01/23/2016  . Dizziness 12/27/2015  . Episodic confusion 12/27/2015  . Asthma 01/14/2013  . Status asthmaticus 12/06/2012  . Sterilization 07/01/2012    Past Surgical History:  Procedure Laterality Date  . CHOLECYSTECTOMY  2001  . LAPAROSCOPIC TUBAL LIGATION  08/19/2012   Procedure: LAPAROSCOPIC TUBAL LIGATION;  Surgeon: Lazaro Arms, MD;  Location: [facility];  Service: Gynecology;  Laterality: Bilateral;  . lypoma removal  2003   right arm   . NASAL SINUS SURGERY      Prior to Admission medications   Medication Sig Start Date End Date Taking? Authorizing Provider  acetaminophen (TYLENOL) 325 MG tablet Uses as needed for pain    [provider]  albuterol (PROVENTIL) (2.5 MG/3ML) 0.083% nebulizer solution TAKE 3 MLS BY NEBULIZATION EVERY 6 HOURS AS NEEDED FOR WHEEZING. 02/14/17   Jaclyn Shaggy, MD  cetirizine (ZYRTEC) 10 MG tablet Take 10 mg by mouth daily.    [provider]  dexamethasone (DECADRON)  0.5 MG/5ML solution Take 5 mLs (0.5 mg total) by mouth daily. Mixed with viscous lidocaine for 5 minute oral swish 05/01/17   Joni Reining, PA-C  esomeprazole (NEXIUM) 20 MG capsule Take 20 mg by mouth daily at 12 noon.    [provider]  famotidine (PEPCID) 20 MG tablet Take 1 tablet (20 mg total) by mouth at bedtime. 12/08/12   Minor, Vilinda Blanks, NP  fexofenadine (ALLEGRA) 60 MG tablet Take 60 mg by mouth 2 (two) times daily.    [provider]  fluconazole (DIFLUCAN) 150 MG tablet Take 1 tablet (150 mg total) by mouth daily. 04/24/17   Jaclyn Shaggy, MD  fluticasone (FLONASE) 50 MCG/ACT nasal spray Place 1 spray into both nostrils 2 (two) times daily. 02/14/17   Jaclyn Shaggy, MD  ibuprofen (ADVIL,MOTRIN) 200 MG tablet Per bottle    [provider]  ipratropium (ATROVENT) 0.02 % nebulizer solution Take 2.5 mLs (0.5 mg total) by nebulization every 6 (six) hours as needed for wheezing or shortness of breath. 02/14/17   Jaclyn Shaggy, MD  lidocaine (XYLOCAINE) 2 % solution Use as directed 15 mLs in the mouth or throat as needed for mouth pain. 04/24/17   Jaclyn Shaggy, MD  lidocaine (XYLOCAINE) 2 % solution Use as directed 5 mLs in the mouth or throat every 6 (six) hours as needed for mouth pain. Mix with Dexamethasone elixir for 5 minute oral swish 05/01/17   Joni Reining, PA-C  mometasone-formoterol (DULERA) 200-5 MCG/ACT AERO Inhale 2 puffs into the lungs 2 (two) times daily. 02/14/17   Jaclyn Shaggy, MD  predniSONE (DELTASONE) 20 MG tablet Take 1 tablet (20 mg total) by mouth 2 (two) times daily with a meal. 04/14/17   Jaclyn Shaggy, MD  PROAIR HFA 108 (90 Base) MCG/ACT inhaler Inhale 1-2 puffs into the lungs every 6 (six) hours as needed for wheezing or shortness of breath. 03/19/17   Jaclyn Shaggy, MD  triamcinolone cream (KENALOG) 0.1 % Apply 1 application topically 2 (two) times daily. 03/20/17   Jaclyn Shaggy, MD    Allergies Patient has no known allergies.  Family  History  Problem Relation Age of Onset  . Heart disease Paternal Grandfather   . Cancer Maternal Grandmother     Social History Social History  Substance Use Topics  . Smoking status: Former Smoker    Packs/day: 1.00    Years: 2.00    Types: Cigarettes    Quit date: 07/10/2011  . Smokeless tobacco: Never Used  . Alcohol use 0.6 oz/week    1 Glasses of wine per week     Comment: Occ    Review of Systems  Constitutional: No fever/chills Eyes: No visual changes. ENT: Oral lesion  Roof of mouth.  Cardiovascular: Denies chest pain. Respiratory: Denies shortness of breath. Gastrointestinal: No abdominal pain.  No nausea, no vomiting.  No diarrhea.  No constipation. Genitourinary: Negative for dysuria. Musculoskeletal: Negative for back pain. Skin: Negative for rash. Neurological: Negative for headaches, focal weakness or numbness.   ____________________________________________   PHYSICAL EXAM:  VITAL SIGNS: ED Triage Vitals  Enc Vitals Group     BP 05/01/17 1006 138/67     Pulse Rate 05/01/17 1006 83     Resp 05/01/17 1006 20     Temp 05/01/17 1006 97.9 F (36.6 C)     Temp Source 05/01/17 1006 Oral     SpO2 05/01/17 1006 100 %     Weight 05/01/17 1006 202 lb (91.6 kg)     Height 05/01/17 1006 5\' 7"  (1.702 m)     Head Circumference --      Peak Flow --      Pain Score 05/01/17 1005 4     Pain Loc --      Pain Edu? --      Excl. in GC? --     Constitutional: Alert and oriented. Well appearing and in no acute distress. Eyes: Conjunctivae are normal. PERRL. EOMI. Head: Atraumatic. Nose: No congestion/rhinnorhea. Mouth/Throat: Mucous membranes are moist.  Oropharynx With erythematous ulcerative lesions Neck: No stridor.  No cervical spine tenderness to palpation. Hematological/Lymphatic/Immunilogical: No cervical lymphadenopathy. Cardiovascular: Normal rate, regular rhythm. Grossly normal heart sounds.  Good peripheral circulation. Respiratory: Normal  respiratory effort.  No retractions. Lungs CTAB. Gastrointestinal: Soft and nontender. No distention. No abdominal bruits. No CVA tenderness. Musculoskeletal: No lower extremity tenderness nor edema.  No joint effusions. Neurologic:  Normal speech and language. No gross focal neurologic deficits are appreciated. No gait instability. Skin:  Skin is warm, dry and intact. No rash noted. Psychiatric: Mood and affect are normal. Speech and behavior are normal.  ____________________________________________   LABS (all labs ordered are listed, but only abnormal results are displayed)  Labs Reviewed - No data to display ____________________________________________  EKG   ____________________________________________  RADIOLOGY   ____________________________________________   PROCEDURES  Procedure(s) performed: None  Procedures  Critical Care performed: No  ____________________________________________   INITIAL IMPRESSION / ASSESSMENT AND PLAN /  ED COURSE  Pertinent labs & imaging results that were available during my care of the patient were reviewed by me and considered in my medical decision making (see chart for details).  Aphthous ulcers. Patient given discharge care instructions. Patient advised follow-up with the ENT clinic if no improvement in one week.      ____________________________________________   FINAL CLINICAL IMPRESSION(S) / ED DIAGNOSES  Final diagnoses:  Aphthous ulcer of mouth      NEW MEDICATIONS STARTED DURING THIS VISIT:  New Prescriptions   DEXAMETHASONE (DECADRON) 0.5 MG/5ML SOLUTION    Take 5 mLs (0.5 mg total) by mouth daily. Mixed with viscous lidocaine for 5 minute oral swish   LIDOCAINE (XYLOCAINE) 2 % SOLUTION    Use as directed 5 mLs in the mouth or throat every 6 (six) hours as needed for mouth pain. Mix with Dexamethasone elixir for 5 minute oral swish     Note:  This document was prepared using Dragon voice recognition  software and may include unintentional dictation errors.    Joni ReiningSmith, Ronald K, PA-C 05/01/17 1058    Willy Eddyobinson, Patrick, MD 05/02/17 [phone]

## 2017-05-01 NOTE — Telephone Encounter (Signed)
Pt. Called requesting to speak with a nurse regarding her swollen mouth. Pt. Was seen 04/24/17 and was given medication but she has not gotten better. Pt. Also states that after taking the medication a rash appeared on her pelvic area . Please f/u with pt.

## 2017-05-07 ENCOUNTER — Telehealth: Payer: Self-pay | Admitting: Family Medicine

## 2017-05-07 NOTE — Telephone Encounter (Signed)
Pt calling to advise of new sx since last visit, ed said pt did not have thrush, had viral infection....using oral steroid rinse for 5 days now, feeling dizzy and very tired. States that throat and mouth are beginning to feel better but would like to know if she needs to be seen by Dr Venetia NightAmao regarding new sx. Also advises that during second day of thrush treatment, pt exhibited red splotchy rash near pubic area, not itchy. States that rash lasted half a day and then went away. Requests Dr give return call.

## 2017-05-08 NOTE — Telephone Encounter (Signed)
If she is dizzy and tired she will need to be evaluated.

## 2017-05-14 NOTE — Telephone Encounter (Signed)
Pt. Called back requesting to speak with her PCP. Pt. Was told that if she felt tired or dizzy she needed to schedule an appointment. Pt. Stated that she has some questions to ask her PCP. Please f/u

## 2017-05-15 NOTE — Telephone Encounter (Signed)
States she completed mouth wash 4 days ago and is starting feel pain again.  Was told to f/u with the PCP if sx's persist. She wanted to have work up for mono by PCP however she states she is unable to come in tomorrow due to work. She would be able to come in on Monday or go to an Urgent Care.

## 2017-05-15 NOTE — Telephone Encounter (Signed)
Could you please find out what questions she has? Thank you

## 2017-05-19 ENCOUNTER — Encounter (HOSPITAL_COMMUNITY): Payer: Self-pay | Admitting: Emergency Medicine

## 2017-05-19 ENCOUNTER — Ambulatory Visit (HOSPITAL_COMMUNITY)
Admission: EM | Admit: 2017-05-19 | Discharge: 2017-05-19 | Disposition: A | Discharge status: Home / Self Care | Payer: Self-pay | Attending: Internal Medicine | Admitting: Internal Medicine

## 2017-05-19 DIAGNOSIS — J029 Acute pharyngitis, unspecified: Secondary | ICD-10-CM

## 2017-05-19 DIAGNOSIS — J039 Acute tonsillitis, unspecified: Secondary | ICD-10-CM | POA: Insufficient documentation

## 2017-05-19 DIAGNOSIS — Z79899 Other long term (current) drug therapy: Secondary | ICD-10-CM | POA: Insufficient documentation

## 2017-05-19 DIAGNOSIS — R5383 Other fatigue: Secondary | ICD-10-CM | POA: Insufficient documentation

## 2017-05-19 DIAGNOSIS — J45909 Unspecified asthma, uncomplicated: Secondary | ICD-10-CM | POA: Insufficient documentation

## 2017-05-19 DIAGNOSIS — Z87891 Personal history of nicotine dependence: Secondary | ICD-10-CM | POA: Insufficient documentation

## 2017-05-19 DIAGNOSIS — K219 Gastro-esophageal reflux disease without esophagitis: Secondary | ICD-10-CM | POA: Insufficient documentation

## 2017-05-19 LAB — POCT RAPID STREP A: STREPTOCOCCUS, GROUP A SCREEN (DIRECT): NEGATIVE

## 2017-05-19 LAB — CBC WITH DIFFERENTIAL/PLATELET
Basophils Absolute: 0.1 10*3/uL (ref 0.0–0.1)
Basophils Relative: 1 %
EOS ABS: 0.3 10*3/uL (ref 0.0–0.7)
EOS PCT: 4 %
HCT: 40 % (ref 36.0–46.0)
Hemoglobin: 12.9 g/dL (ref 12.0–15.0)
LYMPHS ABS: 2 10*3/uL (ref 0.7–4.0)
Lymphocytes Relative: 27 %
MCH: 25.9 pg — AB (ref 26.0–34.0)
MCHC: 32.3 g/dL (ref 30.0–36.0)
MCV: 80.3 fL (ref 78.0–100.0)
MONOS PCT: 7 %
Monocytes Absolute: 0.5 10*3/uL (ref 0.1–1.0)
Neutro Abs: 4.6 10*3/uL (ref 1.7–7.7)
Neutrophils Relative %: 61 %
PLATELETS: 284 10*3/uL (ref 150–400)
RBC: 4.98 MIL/uL (ref 3.87–5.11)
RDW: 13.5 % (ref 11.5–15.5)
WBC: 7.4 10*3/uL (ref 4.0–10.5)

## 2017-05-19 LAB — POCT INFECTIOUS MONO SCREEN: MONO SCREEN: NEGATIVE

## 2017-05-19 NOTE — ED Triage Notes (Signed)
[facility] a [facility] sore throat x 1 month. The patient reported that this was the third place she has been evaluated for the symptoms.

## 2017-05-19 NOTE — ED Provider Notes (Signed)
[facility]    CSN: [id number] Arrival date & time: 05/19/17  1253     History   Chief Complaint Chief Complaint  Patient presents with  . Sore Throat    HPI Katrina Mitchell is a 36 y.o. female. She presents today with a 10+ week history of mouth irritation/sore throat. She has been evaluated on several occasions for this, and has been treated with steroids, topical lidocaine, Diflucan. CMP was done in the ER in March, but I do not see a recent throat swab, or CBC, or Monospot. She presents today with 2 weeks history of malaise, persistent sore throat with large tonsils on exam. She is more interested in a diagnosis than in further symptomatic measures.    HPI  Past Medical History:  Diagnosis Date  . Asthma   . Complication of anesthesia   . GERD (gastroesophageal reflux disease)   . Pregnancy as incidental finding     Patient Active Problem List   Diagnosis Date Noted  . Eczema 03/20/2017  . Cervicogenic headache 01/23/2016  . Dizziness 12/27/2015  . Episodic confusion 12/27/2015  . Asthma 01/14/2013  . Status asthmaticus 12/06/2012  . Sterilization 07/01/2012    Past Surgical History:  Procedure Laterality Date  . CHOLECYSTECTOMY  2001  . LAPAROSCOPIC TUBAL LIGATION  08/19/2012   Procedure: LAPAROSCOPIC TUBAL LIGATION;  Surgeon: Lazaro ArmsLuther H Eure, MD;  Location: [facility];  Service: Gynecology;  Laterality: Bilateral;  . lypoma removal  2003   right arm   . NASAL SINUS SURGERY      OB History    Gravida Para Term Preterm AB Living   3 3 2 1  0 3   SAB TAB Ectopic Multiple Live Births   0 0 0 0 1       Home Medications    Prior to Admission medications   Medication Sig Start Date End Date Taking? Authorizing Provider  albuterol (PROVENTIL) (2.5 MG/3ML) 0.083% nebulizer solution TAKE 3 MLS BY NEBULIZATION EVERY 6 HOURS AS NEEDED FOR WHEEZING. 02/14/17  Yes Jaclyn ShaggyAmao, Enobong, MD  cetirizine (ZYRTEC) 10 MG tablet Take 10 mg by mouth daily.   Yes [provider]  esomeprazole (NEXIUM) 20 MG capsule Take 20 mg by mouth daily at 12 noon.   Yes [provider]  fexofenadine (ALLEGRA) 60 MG tablet Take 60 mg by mouth 2 (two) times daily.   Yes [provider]  mometasone-formoterol (DULERA) 200-5 MCG/ACT AERO Inhale 2 puffs into the lungs 2 (two) times daily. 02/14/17  Yes Jaclyn ShaggyAmao, Enobong, MD  acetaminophen (TYLENOL) 325 MG tablet Uses as needed for pain    [provider]  dexamethasone (DECADRON) 0.5 MG/5ML solution Take 5 mLs (0.5 mg total) by mouth daily. Mixed with viscous lidocaine for 5 minute oral swish 05/01/17   Joni ReiningSmith, Ronald K, PA-C  famotidine (PEPCID) 20 MG tablet Take 1 tablet (20 mg total) by mouth at bedtime. 12/08/12   Minor, Vilinda BlanksWilliam S, NP  fluconazole (DIFLUCAN) 150 MG tablet Take 1 tablet (150 mg total) by mouth daily. 04/24/17   Jaclyn ShaggyAmao, Enobong, MD  fluticasone (FLONASE) 50 MCG/ACT nasal spray Place 1 spray into both nostrils 2 (two) times daily. 02/14/17   Jaclyn ShaggyAmao, Enobong, MD  ibuprofen (ADVIL,MOTRIN) 200 MG tablet Per bottle    [provider]  ipratropium (ATROVENT) 0.02 % nebulizer solution Take 2.5 mLs (0.5 mg total) by nebulization every 6 (six) hours as needed for wheezing or shortness of breath. 02/14/17   Jaclyn ShaggyAmao, Enobong, MD  lidocaine (XYLOCAINE) 2 % solution Use as directed 15 mLs in the mouth or throat as needed for mouth pain. 04/24/17   Jaclyn Shaggy, MD  lidocaine (XYLOCAINE) 2 % solution Use as directed 5 mLs in the mouth or throat every 6 (six) hours as needed for mouth pain. Mix with Dexamethasone elixir for 5 minute oral swish 05/01/17   Joni Reining, PA-C  predniSONE (DELTASONE) 20 MG tablet Take 1 tablet (20 mg total) by mouth 2 (two) times daily with a meal. 04/14/17   Jaclyn Shaggy, MD  PROAIR HFA 108 (90 Base) MCG/ACT inhaler Inhale 1-2 puffs into the lungs every 6 (six) hours as needed for wheezing or shortness of breath. 03/19/17   Jaclyn Shaggy, MD  triamcinolone cream (KENALOG) 0.1 %  Apply 1 application topically 2 (two) times daily. 03/20/17   Jaclyn Shaggy, MD    Family History Family History  Problem Relation Age of Onset  . Heart disease Paternal Grandfather   . Cancer Maternal Grandmother     Social History Social History  Substance Use Topics  . Smoking status: Former Smoker    Packs/day: 1.00    Years: 2.00    Types: Cigarettes    Quit date: 07/10/2011  . Smokeless tobacco: Never Used  . Alcohol use 0.6 oz/week    1 Glasses of wine per week     Comment: Occ     Allergies   Patient has no known allergies.   Review of Systems Review of Systems  All other systems reviewed and are negative.    Physical Exam Triage Vital Signs ED Triage Vitals  Enc Vitals Group     BP 05/19/17 1322 (!) 120/57     Pulse Rate 05/19/17 1322 86     Resp 05/19/17 1322 16     Temp 05/19/17 1322 98.6 F (37 C)     Temp Source 05/19/17 1322 Oral     SpO2 05/19/17 1322 97 %     Weight --      Height --      Pain Score 05/19/17 1320 4     Pain Loc --    Updated Vital Signs BP (!) 120/57 (BP Location: Right Arm)   Pulse 86   Temp 98.6 F (37 C) (Oral)   Resp 16   SpO2 97%   Physical Exam  Constitutional: She is oriented to person, place, and time. No distress.  Looks tired but not toxic Voice is slightly hoarse  HENT:  Head: Atraumatic.  Left TM is translucent, faintly flushed pink Right TM is markedly dull, almost opaque, and also faintly flushed pink Right greater than left moderate nasal congestion Tonsils are deep pink, prominent, equivocal scant exudate on the right  Eyes:  Conjugate gaze observed, no eye redness/discharge  Neck: Neck supple.  Cardiovascular: Normal rate and regular rhythm.   Pulmonary/Chest: No respiratory distress. She has no wheezes. She has no rales.  Lungs clear, symmetric breath sounds  Abdominal: Soft. She exhibits no distension. There is no rebound and no guarding.  Moderate epigastric/left upper quadrant tenderness to  deep palpation on exam  Musculoskeletal: Normal range of motion.  Neurological: She is alert and oriented to person, place, and time.  Skin: Skin is warm and dry.  Nursing note and vitals reviewed.    UC Treatments / Results  Labs Results for orders placed or performed during the hospital encounter of 05/19/17  Culture, group A strep  Result Value Ref Range   Specimen Description  THROAT    Special Requests NONE    Culture MODERATE STREPTOCOCCUS,BETA HEMOLYTIC NOT GROUP A    Report Status PENDING   CBC with Differential  Result Value Ref Range   WBC 7.4 4.0 - 10.5 K/uL   RBC 4.98 3.87 - 5.11 MIL/uL   Hemoglobin 12.9 12.0 - 15.0 g/dL   HCT 57.8 46.9 - 62.9 %   MCV 80.3 78.0 - 100.0 fL   MCH 25.9 (L) 26.0 - 34.0 pg   MCHC 32.3 30.0 - 36.0 g/dL   RDW 52.8 41.3 - 24.4 %   Platelets 284 150 - 400 K/uL   Neutrophils Relative % 61 %   Neutro Abs 4.6 1.7 - 7.7 K/uL   Lymphocytes Relative 27 %   Lymphs Abs 2.0 0.7 - 4.0 K/uL   Monocytes Relative 7 %   Monocytes Absolute 0.5 0.1 - 1.0 K/uL   Eosinophils Relative 4 %   Eosinophils Absolute 0.3 0.0 - 0.7 K/uL   Basophils Relative 1 %   Basophils Absolute 0.1 0.0 - 0.1 K/uL  Epstein-Barr virus VCA antibody panel  Result Value Ref Range   EBV VCA IgG >600.0 (H) 0.0 - 17.9 U/mL   EBV VCA IgM <36.0 0.0 - 35.9 U/mL   EBV NA IgG 503.0 (H) 0.0 - 17.9 U/mL   EBV Early Antigen Ab, IgG <9.0 0.0 - 8.9 U/mL   Interpretation: Comment   Infectious mono screen, POC  Result Value Ref Range   Mono Screen NEGATIVE NEGATIVE  POCT rapid strep A [facility])  Result Value Ref Range   Streptococcus, Group A Screen (Direct) NEGATIVE NEGATIVE    Procedures Procedures (including critical care time) None today  Final Clinical Impressions(s) / UC Diagnoses   Final diagnoses:  Tonsillitis  Pharyngitis, unspecified etiology  Fatigue, unspecified type   Persistent mouth and throat symptoms, with fatigue, suggest a viral etiology. Cannot rule  out chronic irritation from another source, such as a hygiene product or possibly mechanical trauma. Strep swab and mono test were negative at the urgent care today. The throat culture is pending, as is the blood count.  Epstein-Barr virus titers were drawn as well. Please follow-up with your primary care provider for further evaluation and management of your symptoms. No danger signs on exam today.    Eustace Moore, MD 05/21/17 1252

## 2017-05-19 NOTE — Discharge Instructions (Addendum)
Persistent mouth and throat symptoms, with fatigue, suggest a viral etiology. Cannot rule out chronic irritation from another source, such as a hygiene product or possibly mechanical trauma. Strep swab and mono test were negative at the urgent care today. The throat culture is pending, as is the blood count.  Epstein-Barr virus titers were drawn as well. Please follow-up with your primary care provider for further evaluation and management of your symptoms. No danger signs on exam today.

## 2017-05-20 LAB — EPSTEIN-BARR VIRUS VCA ANTIBODY PANEL
EBV NA IGG: 503 U/mL — AB (ref 0.0–17.9)
EBV VCA IgG: >600 U/mL — ABNORMAL HIGH (ref 0.0–17.9)
EBV VCA IgM: <36 U/mL (ref 0.0–35.9)

## 2017-05-21 ENCOUNTER — Telehealth (HOSPITAL_COMMUNITY): Payer: Self-pay | Admitting: Internal Medicine

## 2017-05-21 LAB — CULTURE, GROUP A STREP (THRC)

## 2017-05-21 MED ORDER — PENICILLIN V POTASSIUM 500 MG PO TABS
500.0000 mg | ORAL_TABLET | Freq: Two times a day (BID) | ORAL | 0 refills | Status: DC
Start: 2017-05-21 — End: 2017-06-26

## 2017-05-21 NOTE — Telephone Encounter (Signed)
Clinical staff, please let patient know that throat culture is positive for non group A strep.  Rx penicillin sent to the pharmacy of record, [facility] in [place].   Tests for mono suggest old/previous infection, rather than new/current infection.   Recheck or followup with PCP for further evaluation if symptoms are not improving.  LM

## 2017-05-31 NOTE — Telephone Encounter (Signed)
Called and LM on [phone] Need to give lab results and to see how pt is doing from recent visit Notified pt in gen message of results and that there is NO need to call back unless not feeling any better, not tolerating meds well or if wanting to know lab results. Also let pt know labs can be obtained from MyChart Notified of pending Rx sent to pharmacy

## 2017-06-26 ENCOUNTER — Encounter: Payer: Self-pay | Admitting: Family Medicine

## 2017-06-26 ENCOUNTER — Ambulatory Visit: Payer: Self-pay | Attending: Family Medicine | Admitting: Family Medicine

## 2017-06-26 VITALS — BP 121/80 | HR 83 | Temp 97.7°F | Resp 20 | Wt 204.6 lb

## 2017-06-26 DIAGNOSIS — Z9049 Acquired absence of other specified parts of digestive tract: Secondary | ICD-10-CM | POA: Insufficient documentation

## 2017-06-26 DIAGNOSIS — Z9851 Tubal ligation status: Secondary | ICD-10-CM | POA: Insufficient documentation

## 2017-06-26 DIAGNOSIS — R300 Dysuria: Secondary | ICD-10-CM

## 2017-06-26 DIAGNOSIS — J0120 Acute ethmoidal sinusitis, unspecified: Secondary | ICD-10-CM

## 2017-06-26 DIAGNOSIS — N898 Other specified noninflammatory disorders of vagina: Secondary | ICD-10-CM

## 2017-06-26 DIAGNOSIS — K219 Gastro-esophageal reflux disease without esophagitis: Secondary | ICD-10-CM | POA: Insufficient documentation

## 2017-06-26 DIAGNOSIS — J4540 Moderate persistent asthma, uncomplicated: Secondary | ICD-10-CM

## 2017-06-26 DIAGNOSIS — Z114 Encounter for screening for human immunodeficiency virus [HIV]: Secondary | ICD-10-CM

## 2017-06-26 DIAGNOSIS — L298 Other pruritus: Secondary | ICD-10-CM

## 2017-06-26 DIAGNOSIS — Z9889 Other specified postprocedural states: Secondary | ICD-10-CM | POA: Insufficient documentation

## 2017-06-26 LAB — POCT URINALYSIS DIPSTICK
Bilirubin, UA: NEGATIVE
Glucose, UA: NEGATIVE
Ketones, UA: NEGATIVE
Leukocytes, UA: NEGATIVE
Nitrite, UA: NEGATIVE
PROTEIN UA: NEGATIVE
RBC UA: NEGATIVE
UROBILINOGEN UA: 0.2 U/dL
pH, UA: 6 (ref 5.0–8.0)

## 2017-06-26 MED ORDER — ALBUTEROL SULFATE (2.5 MG/3ML) 0.083% IN NEBU: INHALATION_SOLUTION | RESPIRATORY_TRACT | 1 refills | Status: DC

## 2017-06-26 MED ORDER — AMOXICILLIN 500 MG PO CAPS
500.0000 mg | ORAL_CAPSULE | Freq: Three times a day (TID) | ORAL | 0 refills | Status: DC
Start: 2017-06-26 — End: 2017-11-13

## 2017-06-26 MED ORDER — MOMETASONE FURO-FORMOTEROL FUM 200-5 MCG/ACT IN AERO: 2.0000 | INHALATION_SPRAY | Freq: Two times a day (BID) | RESPIRATORY_TRACT | 3 refills | Status: DC

## 2017-06-26 MED ORDER — FLUTICASONE PROPIONATE 50 MCG/ACT NA SUSP: 1.0000 | Freq: Two times a day (BID) | NASAL | 3 refills | Status: DC

## 2017-06-26 MED ORDER — BENZONATATE 100 MG PO CAPS
100.0000 mg | ORAL_CAPSULE | Freq: Two times a day (BID) | ORAL | 0 refills | Status: DC | PRN
Start: 2017-06-26 — End: 2017-11-13

## 2017-06-26 MED ORDER — ALBUTEROL SULFATE HFA 108 (90 BASE) MCG/ACT IN AERS: 2.0000 | INHALATION_SPRAY | Freq: Four times a day (QID) | RESPIRATORY_TRACT | 3 refills | Status: DC | PRN

## 2017-06-26 MED ORDER — AMOXICILLIN 500 MG PO CAPS: 500.0000 mg | ORAL_CAPSULE | Freq: Three times a day (TID) | ORAL | 0 refills | Status: DC

## 2017-06-26 MED ORDER — CLOTRIMAZOLE 1 % EX CREA: 1.0000 "application " | TOPICAL_CREAM | Freq: Two times a day (BID) | CUTANEOUS | 0 refills | Status: DC

## 2017-06-26 MED ORDER — PROAIR HFA 108 (90 BASE) MCG/ACT IN AERS: 1.0000 | INHALATION_SPRAY | Freq: Four times a day (QID) | RESPIRATORY_TRACT | 2 refills | Status: DC | PRN

## 2017-06-26 MED ORDER — BENZONATATE 100 MG PO CAPS: 100.0000 mg | ORAL_CAPSULE | Freq: Two times a day (BID) | ORAL | 0 refills | Status: DC | PRN

## 2017-06-26 MED FILL — AMOXICILLIN 500 MG CAPSULE: 500 | 10 days supply | Qty: 30 | Fill #0

## 2017-06-26 MED FILL — BENZONATATE 100 MG CAPSULE: 100 | 10 days supply | Qty: 20 | Fill #0

## 2017-06-26 NOTE — Patient Instructions (Signed)

## 2017-06-26 NOTE — Progress Notes (Signed)
Follow up Vaginal sx's: dysuria cough

## 2017-06-26 NOTE — Progress Notes (Signed)
Subjective:  Patient ID: Katrina Mitchell, female    DOB: 05/31/1981  Age: 36 y.o. MRN: [id number]  CC: Follow-up visit  HPI Katrina Mitchell is a 36 year old female with a history of asthma, eczema who presents today with a four-day history of a raspy cough productive of light yellowish sputum, nasal congestion but denies fever. She has also noticed fatigue which she states has been present ever since she was treated for left was also some month ago. She gives a history of sick contact in her niece, she came across a family reunion recently. This has caused an increase in the use of her rescue inhaler.  She complains of dysuria, burning in her perineum but no urinary frequency or flank pain. Denies vaginal itching or burning. She would like to be checked for HIV again as she had unprotected intercourse with her sons dad few weeks back.  Past Medical History:  Diagnosis Date  . Asthma   . Complication of anesthesia   . GERD (gastroesophageal reflux disease)   . Pregnancy as incidental finding     Past Surgical History:  Procedure Laterality Date  . CHOLECYSTECTOMY  2001  . LAPAROSCOPIC TUBAL LIGATION  08/19/2012   Procedure: LAPAROSCOPIC TUBAL LIGATION;  Surgeon: Lazaro ArmsLuther H Eure, MD;  Location: [facility];  Service: Gynecology;  Laterality: Bilateral;  . lypoma removal  2003   right arm   . NASAL SINUS SURGERY      No Known Allergies  Outpatient Medications Prior to Visit  Medication Sig Dispense Refill  . acetaminophen (TYLENOL) 325 MG tablet Uses as needed for pain    . cetirizine (ZYRTEC) 10 MG tablet Take 10 mg by mouth daily.    Marland Kitchen. esomeprazole (NEXIUM) 20 MG capsule Take 20 mg by mouth daily at 12 noon.    . famotidine (PEPCID) 20 MG tablet Take 1 tablet (20 mg total) by mouth at bedtime.    . fexofenadine (ALLEGRA) 60 MG tablet Take 60 mg by mouth 2 (two) times daily.    Marland Kitchen. ibuprofen (ADVIL,MOTRIN) 200 MG tablet Per bottle    . ipratropium (ATROVENT) 0.02 % nebulizer solution Take  2.5 mLs (0.5 mg total) by nebulization every 6 (six) hours as needed for wheezing or shortness of breath. 30 mL 3  . lidocaine (XYLOCAINE) 2 % solution Use as directed 15 mLs in the mouth or throat as needed for mouth pain. 100 mL 0  . triamcinolone cream (KENALOG) 0.1 % Apply 1 application topically 2 (two) times daily. 30 g 1  . albuterol (PROVENTIL) (2.5 MG/3ML) 0.083% nebulizer solution TAKE 3 MLS BY NEBULIZATION EVERY 6 HOURS AS NEEDED FOR WHEEZING. 75 mL 1  . fluconazole (DIFLUCAN) 150 MG tablet Take 1 tablet (150 mg total) by mouth daily. 7 tablet 0  . fluticasone (FLONASE) 50 MCG/ACT nasal spray Place 1 spray into both nostrils 2 (two) times daily. 16 g 3  . mometasone-formoterol (DULERA) 200-5 MCG/ACT AERO Inhale 2 puffs into the lungs 2 (two) times daily. 39 g 3  . PROAIR HFA 108 (90 Base) MCG/ACT inhaler Inhale 1-2 puffs into the lungs every 6 (six) hours as needed for wheezing or shortness of breath. 1 Inhaler 2  . dexamethasone (DECADRON) 0.5 MG/5ML solution Take 5 mLs (0.5 mg total) by mouth daily. Mixed with viscous lidocaine for 5 minute oral swish (Patient not taking: Reported on 06/26/2017) 120 mL 0  . lidocaine (XYLOCAINE) 2 % solution Use as directed 5 mLs in the mouth or throat every  6 (six) hours as needed for mouth pain. Mix with Dexamethasone elixir for 5 minute oral swish (Patient not taking: Reported on 06/26/2017) 120 mL 0  . penicillin v potassium (VEETID) 500 MG tablet Take 1 tablet (500 mg total) by mouth 2 (two) times daily. (Patient not taking: Reported on 06/26/2017) 20 tablet 0  . predniSONE (DELTASONE) 20 MG tablet Take 1 tablet (20 mg total) by mouth 2 (two) times daily with a meal. (Patient not taking: Reported on 06/26/2017) 10 tablet 0   No facility-administered medications prior to visit.     ROS Review of Systems  Constitutional: Negative for activity change, appetite change and fatigue.  HENT: Positive for congestion, postnasal drip and sinus pressure.  Negative for sore throat.   Eyes: Negative for visual disturbance.  Respiratory: Positive for cough. Negative for chest tightness, shortness of breath and wheezing.   Cardiovascular: Negative for chest pain and palpitations.  Gastrointestinal: Negative for abdominal distention, abdominal pain and constipation.  Endocrine: Negative for polydipsia.  Genitourinary: Negative for dysuria and frequency.  Musculoskeletal: Negative for arthralgias and back pain.  Skin: Negative for rash.  Neurological: Negative for tremors, light-headedness and numbness.  Hematological: Does not bruise/bleed easily.  Psychiatric/Behavioral: Negative for agitation and behavioral problems.    Objective:  BP 121/80 (BP Location: Right Arm, Patient Position: Sitting, Cuff Size: Large)   Pulse 83   Temp 97.7 F (36.5 C) (Oral)   Resp 20   Wt 204 lb 9.6 oz (92.8 kg)   LMP 06/21/2017   SpO2 97%   BMI 32.04 kg/m   BP/Weight 06/26/2017 05/19/2017 05/01/2017  Systolic BP 121 120 138  Diastolic BP 80 57 67  Wt. (Lbs) 204.6 - 202  BMI 32.04 - 31.64      Physical Exam  Constitutional: She is oriented to person, place, and time. She appears well-developed and well-nourished.  HENT:  Nasal speech Normal TM bilaterally Slightly tender frontal and maxillary sinuses Oropharyngeal erythema with postnasal drip  Cardiovascular: Normal rate, normal heart sounds and intact distal pulses.   No murmur heard. Pulmonary/Chest: Effort normal and breath sounds normal. She has no wheezes. She has no rales. She exhibits no tenderness.  Abdominal: Soft. Bowel sounds are normal. She exhibits no distension and no mass. There is no tenderness.  Genitourinary:  Genitourinary Comments: External genitalia, vagina normal. No discharge  Musculoskeletal: Normal range of motion.  Neurological: She is alert and oriented to person, place, and time.  Skin: Skin is warm and dry.  Psychiatric: She has a normal mood and affect.      Assessment & Plan:   1. Dysuria UA negative for UTI - POCT urinalysis dipstick - Cervicovaginal ancillary only  2. Acute non-recurrent ethmoidal sinusitis - amoxicillin (AMOXIL) 500 MG capsule; Take 1 capsule (500 mg total) by mouth 3 (three) times daily.  Dispense: 30 capsule; Refill: 0 - benzonatate (TESSALON) 100 MG capsule; Take 1 capsule (100 mg total) by mouth 2 (two) times daily as needed for cough.  Dispense: 20 capsule; Refill: 0  3. Moderate persistent asthma, uncomplicated Slightly exacerbated by acute sinusitis - albuterol (PROVENTIL) (2.5 MG/3ML) 0.083% nebulizer solution; TAKE 3 MLS BY NEBULIZATION EVERY 6 HOURS AS NEEDED FOR WHEEZING.  Dispense: 75 mL; Refill: 1 - fluticasone (FLONASE) 50 MCG/ACT nasal spray; Place 1 spray into both nostrils 2 (two) times daily.  Dispense: 16 g; Refill: 3 - PROAIR HFA 108 (90 Base) MCG/ACT inhaler; Inhale 1-2 puffs into the lungs every 6 (six) hours as needed  for wheezing or shortness of breath.  Dispense: 1 Inhaler; Refill: 2 - mometasone-formoterol (DULERA) 200-5 MCG/ACT AERO; Inhale 2 puffs into the lungs 2 (two) times daily.  Dispense: 39 g; Refill: 3  4. Screening for HIV (human immunodeficiency virus) - HIV antibody (with reflex)  5. Vaginal itching - clotrimazole (LOTRIMIN) 1 % cream; Apply 1 application topically 2 (two) times daily.  Dispense: 30 g; Refill: 0   Meds ordered this encounter  Medications  . albuterol (PROVENTIL) (2.5 MG/3ML) 0.083% nebulizer solution    Sig: TAKE 3 MLS BY NEBULIZATION EVERY 6 HOURS AS NEEDED FOR WHEEZING.    Dispense:  75 mL    Refill:  1  . fluticasone (FLONASE) 50 MCG/ACT nasal spray    Sig: Place 1 spray into both nostrils 2 (two) times daily.    Dispense:  16 g    Refill:  3  . PROAIR HFA 108 (90 Base) MCG/ACT inhaler    Sig: Inhale 1-2 puffs into the lungs every 6 (six) hours as needed for wheezing or shortness of breath.    Dispense:  1 Inhaler    Refill:  2    To replace  Ventolin  . mometasone-formoterol (DULERA) 200-5 MCG/ACT AERO    Sig: Inhale 2 puffs into the lungs 2 (two) times daily.    Dispense:  39 g    Refill:  3  . amoxicillin (AMOXIL) 500 MG capsule    Sig: Take 1 capsule (500 mg total) by mouth 3 (three) times daily.    Dispense:  30 capsule    Refill:  0  . clotrimazole (LOTRIMIN) 1 % cream    Sig: Apply 1 application topically 2 (two) times daily.    Dispense:  30 g    Refill:  0  . benzonatate (TESSALON) 100 MG capsule    Sig: Take 1 capsule (100 mg total) by mouth 2 (two) times daily as needed for cough.    Dispense:  20 capsule    Refill:  0    Follow-up: Return in about 3 months (around 09/26/2017) for Follow-up on chronic medical conditions.   This note has been created with Education officer, environmental. Any transcriptional errors are unintentional.     Jaclyn Shaggy MD

## 2017-06-27 LAB — CERVICOVAGINAL ANCILLARY ONLY
Bacterial vaginitis: NEGATIVE
CANDIDA VAGINITIS: NEGATIVE
Chlamydia: NEGATIVE
Neisseria Gonorrhea: NEGATIVE
Trichomonas: NEGATIVE

## 2017-06-27 LAB — HIV ANTIBODY (ROUTINE TESTING W REFLEX): HIV SCREEN 4TH GENERATION: NONREACTIVE

## 2017-09-26 ENCOUNTER — Telehealth: Payer: Self-pay | Admitting: Family Medicine

## 2017-09-26 DIAGNOSIS — J4540 Moderate persistent asthma, uncomplicated: Secondary | ICD-10-CM

## 2017-09-26 MED ORDER — ALBUTEROL SULFATE HFA 108 (90 BASE) MCG/ACT IN AERS: 2.0000 | INHALATION_SPRAY | Freq: Four times a day (QID) | RESPIRATORY_TRACT | 1 refills | Status: DC | PRN

## 2017-09-26 MED ORDER — FLUTICASONE PROPIONATE 50 MCG/ACT NA SUSP: 1.0000 | Freq: Two times a day (BID) | NASAL | 1 refills | Status: DC

## 2017-09-26 MED ORDER — MOMETASONE FURO-FORMOTEROL FUM 200-5 MCG/ACT IN AERO: 2.0000 | INHALATION_SPRAY | Freq: Two times a day (BID) | RESPIRATORY_TRACT | 1 refills | Status: DC

## 2017-09-26 MED ORDER — ALBUTEROL SULFATE (2.5 MG/3ML) 0.083% IN NEBU: INHALATION_SOLUTION | RESPIRATORY_TRACT | 1 refills | Status: DC

## 2017-09-26 NOTE — Telephone Encounter (Signed)
Chronic medications refilled.

## 2017-09-26 NOTE — Telephone Encounter (Signed)
Spoke with patient regarding her upcoming appointment. Patient had to reschedule but will run out her medications until her next appt on 12/4. Patient asked that all her medication be refilled, and [facility].  Please follow up.  Thank you.

## 2017-09-29 ENCOUNTER — Ambulatory Visit: Payer: Self-pay | Admitting: Family Medicine

## 2017-10-20 ENCOUNTER — Encounter: Payer: Self-pay | Admitting: Emergency Medicine

## 2017-10-20 ENCOUNTER — Emergency Department: Payer: Self-pay

## 2017-10-20 ENCOUNTER — Emergency Department
Admission: EM | Admit: 2017-10-20 | Discharge: 2017-10-20 | Disposition: A | Discharge status: Home / Self Care | Payer: Self-pay | Attending: Emergency Medicine | Admitting: Emergency Medicine

## 2017-10-20 ENCOUNTER — Other Ambulatory Visit: Payer: Self-pay

## 2017-10-20 DIAGNOSIS — Z79899 Other long term (current) drug therapy: Secondary | ICD-10-CM | POA: Insufficient documentation

## 2017-10-20 DIAGNOSIS — Z87891 Personal history of nicotine dependence: Secondary | ICD-10-CM | POA: Insufficient documentation

## 2017-10-20 DIAGNOSIS — J4540 Moderate persistent asthma, uncomplicated: Secondary | ICD-10-CM

## 2017-10-20 DIAGNOSIS — J4521 Mild intermittent asthma with (acute) exacerbation: Secondary | ICD-10-CM | POA: Insufficient documentation

## 2017-10-20 MED ORDER — IPRATROPIUM BROMIDE 0.02 % IN SOLN: 0.5000 mg | Freq: Four times a day (QID) | RESPIRATORY_TRACT | 3 refills | Status: DC | PRN

## 2017-10-20 MED ORDER — PREDNISONE 10 MG PO TABS: 10.0000 mg | ORAL_TABLET | Freq: Every day | ORAL | 0 refills | Status: DC

## 2017-10-20 MED ORDER — METHYLPREDNISOLONE SODIUM SUCC 125 MG IJ SOLR
125.0000 mg | Freq: Once | INTRAMUSCULAR | Status: AC
  Administered 2017-10-20: 125 mg via INTRAMUSCULAR
  Filled 2017-10-20: qty 2

## 2017-10-20 MED ORDER — IPRATROPIUM-ALBUTEROL 0.5-2.5 (3) MG/3ML IN SOLN
3.0000 mL | Freq: Once | RESPIRATORY_TRACT | Status: AC
  Administered 2017-10-20: 3 mL via RESPIRATORY_TRACT
  Filled 2017-10-20: qty 3

## 2017-10-20 NOTE — ED Notes (Signed)
Pt has wheezing for 1.5 weeks.  Pt using inhaler but states I need steroids.  Weather change has made breathing worse.  Pt alert.  Nonsmoker.

## 2017-10-20 NOTE — ED Notes (Signed)

## 2017-10-20 NOTE — ED Provider Notes (Signed)
[facility] Emergency Department Provider Note  ____________________________________________  Time seen: Approximately 6:24 PM  I have reviewed the triage vital signs and the nursing notes.   HISTORY  Chief Complaint Asthma    HPI Katrina Mitchell is a 36 y.o. female who presents the emergency department complaining of cough, wheezing, shortness of breath times 10 days.  Patient reports that she has a history of asthma and typically has exacerbations with seasonal change.  Patient has had a slight cough but denies any nasal congestion, sore throat, ear pain, fevers or chills, productive cough.  Patient has been using her nebulized albuterol and ipratropium at home.  She states that this helps for her.  But then symptoms return.  Patient states that she typically needs a course of steroids for improvement of her asthma exacerbation.  Patient denies any other complaint.  No other medications for this complaint prior to arrival.  Past Medical History:  Diagnosis Date  . Asthma   . Complication of anesthesia   . GERD (gastroesophageal reflux disease)   . Pregnancy as incidental finding     Patient Active Problem List   Diagnosis Date Noted  . Eczema 03/20/2017  . Cervicogenic headache 01/23/2016  . Dizziness 12/27/2015  . Episodic confusion 12/27/2015  . Asthma 01/14/2013  . Status asthmaticus 12/06/2012  . Sterilization 07/01/2012    Past Surgical History:  Procedure Laterality Date  . CHOLECYSTECTOMY  2001  . lypoma removal  2003   right arm   . NASAL SINUS SURGERY      Prior to Admission medications   Medication Sig Start Date End Date Taking? Authorizing Provider  acetaminophen (TYLENOL) 325 MG tablet Uses as needed for pain    [provider]  albuterol (PROVENTIL HFA;VENTOLIN HFA) 108 (90 Base) MCG/ACT inhaler Inhale 2 puffs into the lungs every 6 (six) hours as needed for wheezing or shortness of breath. 09/26/17   Jaclyn Shaggy, MD   albuterol (PROVENTIL) (2.5 MG/3ML) 0.083% nebulizer solution TAKE 3 MLS BY NEBULIZATION EVERY 6 HOURS AS NEEDED FOR WHEEZING. 09/26/17   Jaclyn Shaggy, MD  amoxicillin (AMOXIL) 500 MG capsule Take 1 capsule (500 mg total) by mouth 3 (three) times daily. 06/26/17   Jaclyn Shaggy, MD  benzonatate (TESSALON) 100 MG capsule Take 1 capsule (100 mg total) by mouth 2 (two) times daily as needed for cough. 06/26/17   Jaclyn Shaggy, MD  cetirizine (ZYRTEC) 10 MG tablet Take 10 mg by mouth daily.    [provider]  clotrimazole (LOTRIMIN) 1 % cream Apply 1 application topically 2 (two) times daily. 06/26/17   Jaclyn Shaggy, MD  esomeprazole (NEXIUM) 20 MG capsule Take 20 mg by mouth daily at 12 noon.    [provider]  famotidine (PEPCID) 20 MG tablet Take 1 tablet (20 mg total) by mouth at bedtime. 12/08/12   Minor, Vilinda Blanks, NP  fexofenadine (ALLEGRA) 60 MG tablet Take 60 mg by mouth 2 (two) times daily.    [provider]  fluticasone (FLONASE) 50 MCG/ACT nasal spray Place 1 spray into both nostrils 2 (two) times daily. 09/26/17   Jaclyn Shaggy, MD  ibuprofen (ADVIL,MOTRIN) 200 MG tablet Per bottle    [provider]  ipratropium (ATROVENT) 0.02 % nebulizer solution Take 2.5 mLs (0.5 mg total) every 6 (six) hours as needed by nebulization for wheezing or shortness of breath. 10/20/17   Lerline Valdivia, Delorise Royals, PA-C  lidocaine (XYLOCAINE) 2 % solution Use as directed 15 mLs in the  mouth or throat as needed for mouth pain. 04/24/17   Jaclyn ShaggyAmao, Enobong, MD  mometasone-formoterol (DULERA) 200-5 MCG/ACT AERO Inhale 2 puffs into the lungs 2 (two) times daily. 09/26/17   Jaclyn ShaggyAmao, Enobong, MD  predniSONE (DELTASONE) 10 MG tablet Take 1 tablet (10 mg total) daily by mouth. 10/20/17   Jaylenn Baiza, Delorise RoyalsJonathan D, PA-C  triamcinolone cream (KENALOG) 0.1 % Apply 1 application topically 2 (two) times daily. 03/20/17   Jaclyn ShaggyAmao, Enobong, MD    Allergies Patient has no known allergies.  Family History   Problem Relation Age of Onset  . Heart disease Paternal Grandfather   . Cancer Maternal Grandmother     Social History Social History   Tobacco Use  . Smoking status: Former Smoker    Packs/day: 1.00    Years: 2.00    Pack years: 2.00    Types: Cigarettes    Last attempt to quit: 07/10/2011    Years since quitting: 6.2  . Smokeless tobacco: Never Used  Substance Use Topics  . Alcohol use: Yes    Alcohol/week: 0.6 oz    Types: 1 Glasses of wine per week    Comment: Occ  . Drug use: No     Review of Systems  Constitutional: No fever/chills Eyes: No visual changes. No discharge ENT: No upper respiratory complaints. Cardiovascular: no chest pain. Respiratory: Positive cough.  Positive SOB.  Positive for wheezing Gastrointestinal: No abdominal pain.  No nausea, no vomiting.  No diarrhea.  No constipation. Musculoskeletal: Negative for musculoskeletal pain. Skin: Negative for rash, abrasions, lacerations, ecchymosis. Neurological: Negative for headaches, focal weakness or numbness. 10-point ROS otherwise negative.  ____________________________________________   PHYSICAL EXAM:  VITAL SIGNS: ED Triage Vitals  Enc Vitals Group     BP 10/20/17 1809 (!) 111/99     Pulse Rate 10/20/17 1809 83     Resp 10/20/17 1809 20     Temp 10/20/17 1809 98.4 F (36.9 C)     Temp Source 10/20/17 1809 Oral     SpO2 10/20/17 1809 100 %     Weight 10/20/17 1806 190 lb (86.2 kg)     Height 10/20/17 1806 5\' 7"  (1.702 m)     Head Circumference --      Peak Flow --      Pain Score --      Pain Loc --      Pain Edu? --      Excl. in GC? --      Constitutional: Alert and oriented. Well appearing and in no acute distress. Eyes: Conjunctivae are normal. PERRL. EOMI. Head: Atraumatic. ENT:      Ears: EACs and TMs unremarkable bilaterally.      Nose: No congestion/rhinnorhea.      Mouth/Throat: Mucous membranes are moist.  Neck: No stridor.  Neck is supple full range of  motion Hematological/Lymphatic/Immunilogical: No cervical lymphadenopathy. Cardiovascular: Normal rate, regular rhythm. Normal S1 and S2.  Good peripheral circulation. Respiratory: Normal respiratory effort without tachypnea or retractions. Lungs diffuse expiratory wheezing bilaterally,  Worse on left than right.Peri [place]. Good air entry to the bases with no decreased or absent breath sounds. Musculoskeletal: Full range of motion to all extremities. No gross deformities appreciated. Neurologic:  Normal speech and language. No gross focal neurologic deficits are appreciated.  Skin:  Skin is warm, dry and intact. No rash noted. Psychiatric: Mood and affect are normal. Speech and behavior are normal. Patient exhibits appropriate insight and judgement.   ____________________________________________   LABS (all labs ordered are  listed, but only abnormal results are displayed)  Labs Reviewed - No data to display ____________________________________________  EKG   ____________________________________________  RADIOLOGY Festus BarrenI, Brandom Kerwin D Penne Rosenstock, personally viewed and evaluated these images (plain radiographs) as part of my medical decision making, as well as reviewing the written report by the radiologist.  Dg Chest 2 View  Result Date: 10/20/2017 CLINICAL DATA:  36 year old female with shortness of breath and cough. EXAM: CHEST  2 VIEW COMPARISON:  Chest radiograph dated 12/24/2016 FINDINGS: The heart size and mediastinal contours are within normal limits. Both lungs are clear. The visualized skeletal structures are unremarkable. IMPRESSION: No active cardiopulmonary disease. Electronically Signed   By: Elgie CollardArash  Radparvar M.D.   On: 10/20/2017 18:46    ____________________________________________    PROCEDURES  Procedure(s) performed:    Procedures    Medications  ipratropium-albuterol (DUONEB) 0.5-2.5 (3) MG/3ML nebulizer solution 3 mL (3 mLs Nebulization Given 10/20/17 1846)   methylPREDNISolone sodium succinate (SOLU-MEDROL) 125 mg/2 mL injection 125 mg (125 mg Intramuscular Given 10/20/17 1847)     ____________________________________________   INITIAL IMPRESSION / ASSESSMENT AND PLAN / ED COURSE  Pertinent labs & imaging results that were available during my care of the patient were reviewed by me and considered in my medical decision making (see chart for details).  Review of the [place] CSRS was performed in accordance of the [facility] prior to dispensing any controlled drugs.     Patient's diagnosis is consistent with asthma exacerbation.  Differential included asthma versus viral URI versus pneumonia.  Chest x-ray reveals no consolidation consistent with pneumonia.  Patient does have expiratory wheezing bilaterally.  Patient is given nebulizer treatment and injection of steroids.. Patient will be discharged home with prescriptions for ipratropium bromide nebulizer solution and 12-day taper of prednisone. Patient is to follow up with primary care as needed or otherwise directed. Patient is given ED precautions to return to the ED for any worsening or new symptoms.     ____________________________________________  FINAL CLINICAL IMPRESSION(S) / ED DIAGNOSES  Final diagnoses:  Mild intermittent asthma with exacerbation      NEW MEDICATIONS STARTED DURING THIS VISIT:  ED Discharge Orders        Ordered    ipratropium (ATROVENT) 0.02 % nebulizer solution  Every 6 hours PRN     10/20/17 1908    predniSONE (DELTASONE) 10 MG tablet  Daily    Comments:  Take 6 pills x 2 days, 5 pills x 2 days, 4 pills x 2 days, 3 pills x 2 days, 2 pills x 2 days, and 1 pill x 2 days   10/20/17 1908          This chart was dictated using voice recognition software/Dragon. Despite best efforts to proofread, errors can occur which can change the meaning. Any change was purely unintentional.    Racheal PatchesCuthriell, Loza Prell D, PA-C 10/20/17 1909    Arnaldo NatalMalinda, Paul F,  MD 10/20/17 2350

## 2017-10-20 NOTE — ED Notes (Signed)
No fevers 

## 2017-10-20 NOTE — ED Triage Notes (Signed)
Pt here for asthma.  Reports gets triggered every year with weather change and normally just needs steroids.  Has nebs at home but feels needs steroid also. Moving air well, slightly diminished with expiratory wheezing. VSS.  No respiratory distress, unlabored.

## 2017-10-20 NOTE — ED Notes (Signed)
FIRST NURSE NOTE: Pt states she is SOB, hx of asthma. Used inhaler PTA with minor relief. Pt ambulates well, RR equal and unlabored.

## 2017-11-13 ENCOUNTER — Encounter: Payer: Self-pay | Admitting: Family Medicine

## 2017-11-13 ENCOUNTER — Ambulatory Visit: Payer: Self-pay | Attending: Family Medicine | Admitting: Family Medicine

## 2017-11-13 VITALS — BP 105/71 | HR 82 | Temp 98.1°F | Ht 67.0 in | Wt 211.6 lb

## 2017-11-13 DIAGNOSIS — J4540 Moderate persistent asthma, uncomplicated: Secondary | ICD-10-CM | POA: Insufficient documentation

## 2017-11-13 DIAGNOSIS — Z9049 Acquired absence of other specified parts of digestive tract: Secondary | ICD-10-CM | POA: Insufficient documentation

## 2017-11-13 DIAGNOSIS — K219 Gastro-esophageal reflux disease without esophagitis: Secondary | ICD-10-CM | POA: Insufficient documentation

## 2017-11-13 DIAGNOSIS — J329 Chronic sinusitis, unspecified: Secondary | ICD-10-CM | POA: Insufficient documentation

## 2017-11-13 DIAGNOSIS — Z79899 Other long term (current) drug therapy: Secondary | ICD-10-CM | POA: Insufficient documentation

## 2017-11-13 DIAGNOSIS — Z9889 Other specified postprocedural states: Secondary | ICD-10-CM | POA: Insufficient documentation

## 2017-11-13 DIAGNOSIS — J328 Other chronic sinusitis: Secondary | ICD-10-CM

## 2017-11-13 DIAGNOSIS — Z9851 Tubal ligation status: Secondary | ICD-10-CM | POA: Insufficient documentation

## 2017-11-13 DIAGNOSIS — Z23 Encounter for immunization: Secondary | ICD-10-CM | POA: Insufficient documentation

## 2017-11-13 MED ORDER — FLUTICASONE PROPIONATE 50 MCG/ACT NA SUSP: 1.0000 | Freq: Two times a day (BID) | NASAL | 3 refills | Status: DC

## 2017-11-13 MED ORDER — MONTELUKAST SODIUM 10 MG PO TABS: 10.0000 mg | ORAL_TABLET | Freq: Every day | ORAL | 3 refills | Status: DC

## 2017-11-13 MED ORDER — ALBUTEROL SULFATE HFA 108 (90 BASE) MCG/ACT IN AERS: 2.0000 | INHALATION_SPRAY | Freq: Four times a day (QID) | RESPIRATORY_TRACT | 3 refills | Status: DC | PRN

## 2017-11-13 MED ORDER — MOMETASONE FURO-FORMOTEROL FUM 200-5 MCG/ACT IN AERO: 2.0000 | INHALATION_SPRAY | Freq: Two times a day (BID) | RESPIRATORY_TRACT | 3 refills | Status: DC

## 2017-11-13 MED ORDER — ALBUTEROL SULFATE (2.5 MG/3ML) 0.083% IN NEBU: INHALATION_SOLUTION | RESPIRATORY_TRACT | 1 refills | Status: DC

## 2017-11-13 MED FILL — ?MONTELUKAST SOD 10MG TAB: 10 | 30 days supply | Qty: 30 | Fill #0

## 2017-11-13 MED FILL — ?ALBUTEROL SUL 2.5 MG/3 MLS: (2.5 MG/3ML | 7 days supply | Qty: 90 | Fill #0

## 2017-11-13 MED FILL — **DULERA 200 MCG/5 MCG INHA: 200-5 MCG | 15 days supply | Qty: 13 | Fill #0

## 2017-11-13 MED FILL — !VENTOLIN HFA INHALER: 108 (90 BAS | 25 days supply | Qty: 18 | Fill #0

## 2017-11-13 MED FILL — FLUTICASONE PROP 50 MCG SPR: 50 | 30 days supply | Qty: 16 | Fill #0

## 2017-11-13 NOTE — Progress Notes (Signed)
Subjective:  Patient ID: Katrina Mitchell, female    DOB: 1981-09-16  Age: 36 y.o. MRN: [id number]  CC: Asthma   HPI CHALICE Mitchell is a 36 year old female with a history of GERD, asthma, chronic sinusitis who presents today for a follow-up visit.  Since her last office visit she has had two asthma exacerbations in the last 1 month. She had an ED visit on 10/20/17 for asthma exacerbation at which time she was placed on a prednisone taper in addition to nebulizer treatments which she received. A week after completion of prednisone taper she developed wheezing in the middle of the night and had to administer nebulizer treatment.  Her exacerbation is usually brought about by a change in weather and she endorses compliance with her inhalers.  She reports feeling fine since a week ago and denies shortness of breath or chest pain. She has had one intensive care admission in 2013 for asthma.  She denies any recent sinusitis flare is doing well on Flonase.  Past Medical History:  Diagnosis Date  . Asthma   . Complication of anesthesia   . GERD (gastroesophageal reflux disease)   . Pregnancy as incidental finding     Past Surgical History:  Procedure Laterality Date  . CHOLECYSTECTOMY  2001  . LAPAROSCOPIC TUBAL LIGATION  08/19/2012   Procedure: LAPAROSCOPIC TUBAL LIGATION;  Surgeon: Lazaro Arms, MD;  Location: [facility];  Service: Gynecology;  Laterality: Bilateral;  . lypoma removal  2003   right arm   . NASAL SINUS SURGERY      No Known Allergies   Outpatient Medications Prior to Visit  Medication Sig Dispense Refill  . acetaminophen (TYLENOL) 325 MG tablet Uses as needed for pain    . cetirizine (ZYRTEC) 10 MG tablet Take 10 mg by mouth daily.    Marland Kitchen esomeprazole (NEXIUM) 20 MG capsule Take 20 mg by mouth daily at 12 noon.    . famotidine (PEPCID) 20 MG tablet Take 1 tablet (20 mg total) by mouth at bedtime.    . fexofenadine (ALLEGRA) 60 MG tablet Take 60 mg by mouth 2 (two) times  daily.    Marland Kitchen ibuprofen (ADVIL,MOTRIN) 200 MG tablet Per bottle    . ipratropium (ATROVENT) 0.02 % nebulizer solution Take 2.5 mLs (0.5 mg total) every 6 (six) hours as needed by nebulization for wheezing or shortness of breath. 50 mL 3  . triamcinolone cream (KENALOG) 0.1 % Apply 1 application topically 2 (two) times daily. 30 g 1  . albuterol (PROVENTIL HFA;VENTOLIN HFA) 108 (90 Base) MCG/ACT inhaler Inhale 2 puffs into the lungs every 6 (six) hours as needed for wheezing or shortness of breath. 1 Inhaler 1  . albuterol (PROVENTIL) (2.5 MG/3ML) 0.083% nebulizer solution TAKE 3 MLS BY NEBULIZATION EVERY 6 HOURS AS NEEDED FOR WHEEZING. 75 mL 1  . fluticasone (FLONASE) 50 MCG/ACT nasal spray Place 1 spray into both nostrils 2 (two) times daily. 16 g 1  . mometasone-formoterol (DULERA) 200-5 MCG/ACT AERO Inhale 2 puffs into the lungs 2 (two) times daily. 1 Inhaler 1  . lidocaine (XYLOCAINE) 2 % solution Use as directed 15 mLs in the mouth or throat as needed for mouth pain. (Patient not taking: Reported on 11/13/2017) 100 mL 0  . amoxicillin (AMOXIL) 500 MG capsule Take 1 capsule (500 mg total) by mouth 3 (three) times daily. (Patient not taking: Reported on 11/13/2017) 30 capsule 0  . benzonatate (TESSALON) 100 MG capsule Take 1 capsule (100 mg total) by  mouth 2 (two) times daily as needed for cough. (Patient not taking: Reported on 11/13/2017) 20 capsule 0  . clotrimazole (LOTRIMIN) 1 % cream Apply 1 application topically 2 (two) times daily. (Patient not taking: Reported on 11/13/2017) 30 g 0  . predniSONE (DELTASONE) 10 MG tablet Take 1 tablet (10 mg total) daily by mouth. (Patient not taking: Reported on 11/13/2017) 42 tablet 0   No facility-administered medications prior to visit.     ROS Review of Systems  Constitutional: Negative for activity change, appetite change and fatigue.  HENT: Negative for congestion, sinus pressure and sore throat.   Eyes: Negative for visual disturbance.  Respiratory:  Negative for cough, chest tightness, shortness of breath and wheezing.   Cardiovascular: Negative for chest pain and palpitations.  Gastrointestinal: Negative for abdominal distention, abdominal pain and constipation.  Endocrine: Negative for polydipsia.  Genitourinary: Negative for dysuria and frequency.  Musculoskeletal: Negative for arthralgias and back pain.  Skin: Negative for rash.  Neurological: Negative for tremors, light-headedness and numbness.  Hematological: Does not bruise/bleed easily.  Psychiatric/Behavioral: Negative for agitation and behavioral problems.    Objective:  BP 105/71   Pulse 82   Temp 98.1 F (36.7 C) (Oral)   Ht 5\' 7"  (1.702 m)   Wt 211 lb 9.6 oz (96 kg)   LMP 11/08/2017   SpO2 100%   BMI 33.14 kg/m   BP/Weight 11/13/2017 10/20/2017 06/26/2017  Systolic BP 105 111 121  Diastolic BP 71 99 80  Wt. (Lbs) 211.6 190 204.6  BMI 33.14 29.76 32.04      Physical Exam  Constitutional: She is oriented to person, place, and time. She appears well-developed and well-nourished.  Cardiovascular: Normal rate, normal heart sounds and intact distal pulses.  No murmur heard. Pulmonary/Chest: Effort normal and breath sounds normal. She has no wheezes. She has no rales. She exhibits no tenderness.  Abdominal: Soft. Bowel sounds are normal. She exhibits no distension and no mass. There is no tenderness.  Musculoskeletal: Normal range of motion.  Neurological: She is alert and oriented to person, place, and time.  Skin: Skin is warm and dry.  Psychiatric: She has a normal mood and affect.     Assessment & Plan:   1. Moderate persistent asthma, uncomplicated Uncontrolled with recent exacerbation due to weather changes Commence on Singulair If symptoms persist will need to place on Dr. Delford FieldWright scheduled for optimization of management - montelukast (SINGULAIR) 10 MG tablet; Take 1 tablet (10 mg total) by mouth at bedtime.  Dispense: 30 tablet; Refill: 3 -  mometasone-formoterol (DULERA) 200-5 MCG/ACT AERO; Inhale 2 puffs into the lungs 2 (two) times daily.  Dispense: 1 Inhaler; Refill: 3 - albuterol (PROVENTIL) (2.5 MG/3ML) 0.083% nebulizer solution; TAKE 3 MLS BY NEBULIZATION EVERY 6 HOURS AS NEEDED FOR WHEEZING.  Dispense: 75 mL; Refill: 1 - albuterol (PROVENTIL HFA;VENTOLIN HFA) 108 (90 Base) MCG/ACT inhaler; Inhale 2 puffs into the lungs every 6 (six) hours as needed for wheezing or shortness of breath.  Dispense: 1 Inhaler; Refill: 3  2. Need for influenza vaccination - Flu Vaccine QUAD 36+ mos IM  3. Other chronic sinusitis No acute exacerbation - fluticasone (FLONASE) 50 MCG/ACT nasal spray; Place 1 spray into both nostrils 2 (two) times daily.  Dispense: 16 g; Refill: 3   Meds ordered this encounter  Medications  . montelukast (SINGULAIR) 10 MG tablet    Sig: Take 1 tablet (10 mg total) by mouth at bedtime.    Dispense:  30 tablet  Refill:  3  . fluticasone (FLONASE) 50 MCG/ACT nasal spray    Sig: Place 1 spray into both nostrils 2 (two) times daily.    Dispense:  16 g    Refill:  3  . mometasone-formoterol (DULERA) 200-5 MCG/ACT AERO    Sig: Inhale 2 puffs into the lungs 2 (two) times daily.    Dispense:  1 Inhaler    Refill:  3  . albuterol (PROVENTIL) (2.5 MG/3ML) 0.083% nebulizer solution    Sig: TAKE 3 MLS BY NEBULIZATION EVERY 6 HOURS AS NEEDED FOR WHEEZING.    Dispense:  75 mL    Refill:  1  . albuterol (PROVENTIL HFA;VENTOLIN HFA) 108 (90 Base) MCG/ACT inhaler    Sig: Inhale 2 puffs into the lungs every 6 (six) hours as needed for wheezing or shortness of breath.    Dispense:  1 Inhaler    Refill:  3    Follow-up: Return in about 3 months (around 02/11/2018) for follow up on asthma.   Jaclyn ShaggyEnobong Amao MD

## 2017-11-13 NOTE — Patient Instructions (Signed)

## 2017-12-25 MED FILL — FLUTICASONE PROP 50 MCG SPR: 50 | 30 days supply | Qty: 16 | Fill #1

## 2017-12-25 MED FILL — ?MONTELUKAST SOD 10MG TAB: 10 | 30 days supply | Qty: 30 | Fill #1

## 2017-12-25 MED FILL — !DULERA 200 MCG/5 MCG INH: 200-5 | 30 days supply | Qty: 13 | Fill #1

## 2017-12-25 MED FILL — !VENTOLIN HFA INHALER: 108 (90 BAS | 25 days supply | Qty: 18 | Fill #1

## 2017-12-29 ENCOUNTER — Ambulatory Visit: Payer: Self-pay | Attending: Family Medicine

## 2018-02-16 ENCOUNTER — Ambulatory Visit: Payer: Self-pay | Attending: Family Medicine | Admitting: Family Medicine

## 2018-02-16 ENCOUNTER — Encounter: Payer: Self-pay | Admitting: Family Medicine

## 2018-02-16 VITALS — BP 120/80 | HR 81 | Temp 98.2°F | Ht 67.0 in | Wt 200.6 lb

## 2018-02-16 DIAGNOSIS — F4323 Adjustment disorder with mixed anxiety and depressed mood: Secondary | ICD-10-CM | POA: Insufficient documentation

## 2018-02-16 DIAGNOSIS — Z13228 Encounter for screening for other metabolic disorders: Secondary | ICD-10-CM

## 2018-02-16 DIAGNOSIS — J4540 Moderate persistent asthma, uncomplicated: Secondary | ICD-10-CM | POA: Insufficient documentation

## 2018-02-16 DIAGNOSIS — J329 Chronic sinusitis, unspecified: Secondary | ICD-10-CM | POA: Insufficient documentation

## 2018-02-16 DIAGNOSIS — Z79899 Other long term (current) drug therapy: Secondary | ICD-10-CM | POA: Insufficient documentation

## 2018-02-16 DIAGNOSIS — L309 Dermatitis, unspecified: Secondary | ICD-10-CM | POA: Insufficient documentation

## 2018-02-16 DIAGNOSIS — L308 Other specified dermatitis: Secondary | ICD-10-CM

## 2018-02-16 DIAGNOSIS — J328 Other chronic sinusitis: Secondary | ICD-10-CM

## 2018-02-16 LAB — POCT GLYCOSYLATED HEMOGLOBIN (HGB A1C): Hemoglobin A1C: 5.1

## 2018-02-16 MED ORDER — FLUOCINONIDE-E 0.05 % EX CREA: 1.0000 "application " | TOPICAL_CREAM | Freq: Two times a day (BID) | CUTANEOUS | 1 refills | Status: DC

## 2018-02-16 MED ORDER — ALBUTEROL SULFATE HFA 108 (90 BASE) MCG/ACT IN AERS: 2.0000 | INHALATION_SPRAY | Freq: Four times a day (QID) | RESPIRATORY_TRACT | 3 refills | Status: DC | PRN

## 2018-02-16 MED ORDER — MONTELUKAST SODIUM 10 MG PO TABS: 10.0000 mg | ORAL_TABLET | Freq: Every day | ORAL | 3 refills | Status: DC

## 2018-02-16 MED ORDER — FLUTICASONE PROPIONATE 50 MCG/ACT NA SUSP: 1.0000 | Freq: Two times a day (BID) | NASAL | 3 refills | Status: DC

## 2018-02-16 MED ORDER — MOMETASONE FURO-FORMOTEROL FUM 200-5 MCG/ACT IN AERO: 2.0000 | INHALATION_SPRAY | Freq: Two times a day (BID) | RESPIRATORY_TRACT | 3 refills | Status: DC

## 2018-02-16 MED FILL — FLUTICASONE PROP 50 MCG SPR: 50 | 30 days supply | Qty: 16 | Fill #0

## 2018-02-16 MED FILL — ALBUTEROL SULFATE HFA 108 (: 108 (90 BAS | 25 days supply | Qty: 18 | Fill #0

## 2018-02-16 MED FILL — MONTELUKAST SOD 10 MG TAB: 10 | 30 days supply | Qty: 30 | Fill #0

## 2018-02-16 MED FILL — FLUOCINONIDE EMULSIFIED BAS: 0.05 | 15 days supply | Qty: 30 | Fill #0

## 2018-02-16 MED FILL — !DULERA 200 MCG/5 MCG INH: 200-5 | 30 days supply | Qty: 13 | Fill #0

## 2018-02-16 NOTE — Patient Instructions (Signed)

## 2018-02-16 NOTE — Progress Notes (Signed)
Subjective:  Patient ID: Katrina Mitchell, female    DOB: 1981-09-12  Age: 37 y.o. MRN: [id number]  CC: Asthma   HPI Katrina Mitchell is a 37 year old female with a history of GERD, asthma, chronic sinusitis who presents today for a follow-up visit.  At her last visit Singulair had been commenced and she reports no asthma exacerbations in the last 3 months and rarely having to use her albuterol inhaler however she has noted changes in her mood with sometimes being down and sad and at other times snapping easily.  This has caused her to take Singulair intermittently she attributes this change to Singulair. She has been compliant with her maintenance medication. She has had one intensive care admission in 2013 for asthma. Of note she was treated for anxiety and depression in the past and took Zoloft she states with no improvement in symptoms.  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 She denies any recent sinusitis flare is doing well on Flonase.  Past Medical History:  Diagnosis Date  . Asthma   . Complication of anesthesia   . GERD (gastroesophageal reflux disease)   . Pregnancy as incidental finding     Past Surgical History:  Procedure Laterality Date  . CHOLECYSTECTOMY  2001  . LAPAROSCOPIC TUBAL LIGATION  08/19/2012   Procedure: LAPAROSCOPIC TUBAL LIGATION;  Surgeon: Florian Buff, MD;  Location: [facility];  Service: Gynecology;  Laterality: Bilateral;  . lypoma removal  2003   right arm   . NASAL SINUS SURGERY      No Known Allergies   Outpatient Medications Prior to Visit  Medication Sig Dispense Refill  . acetaminophen (TYLENOL) 325 MG tablet Uses as needed for pain    . albuterol (PROVENTIL) (2.5 MG/3ML) 0.083% nebulizer solution TAKE 3 MLS  BY NEBULIZATION EVERY 6 HOURS AS NEEDED FOR WHEEZING. 75 mL 1  . cetirizine (ZYRTEC) 10 MG tablet Take 10 mg by mouth daily.    Marland Kitchen esomeprazole (NEXIUM) 20 MG capsule Take 20 mg by mouth daily at 12 noon.    . fexofenadine (ALLEGRA) 60 MG tablet Take 60 mg by mouth 2 (two) times daily.    Marland Kitchen ibuprofen (ADVIL,MOTRIN) 200 MG tablet Per bottle    . ipratropium (ATROVENT) 0.02 % nebulizer solution Take 2.5 mLs (0.5 mg total) every 6 (six) hours as needed by nebulization for wheezing or shortness of breath. 50 mL 3  . albuterol (PROVENTIL HFA;VENTOLIN HFA) 108 (90 Base) MCG/ACT inhaler Inhale 2 puffs into the lungs every 6 (six) hours as needed for wheezing or shortness of breath. 1 Inhaler 3  . famotidine (PEPCID) 20 MG tablet Take 1 tablet (20 mg total) by mouth at bedtime.    . fluticasone (FLONASE) 50 MCG/ACT nasal spray Place 1 spray into both nostrils 2 (two) times daily. 16 g 3  . mometasone-formoterol (DULERA) 200-5 MCG/ACT AERO Inhale 2 puffs into the lungs 2 (two) times daily. 1 Inhaler 3  . montelukast (SINGULAIR) 10 MG tablet Take 1 tablet (10 mg total) by mouth at bedtime. 30 tablet 3  . triamcinolone cream (KENALOG) 0.1 % Apply 1 application topically 2 (two) times daily. 30 g 1  . lidocaine (XYLOCAINE) 2 % solution Use as  directed 15 mLs in the mouth or throat as needed for mouth pain. (Patient not taking: Reported on 11/13/2017) 100 mL 0   No facility-administered medications prior to visit.     ROS Review of Systems  Constitutional: Negative for activity change, appetite change and fatigue.  HENT: Negative for congestion, sinus pressure and sore throat.   Eyes: Negative for visual disturbance.  Respiratory: Negative for cough, chest tightness, shortness of breath and wheezing.   Cardiovascular: Negative for chest pain and palpitations.  Gastrointestinal: Negative for abdominal distention, abdominal pain and constipation.  Endocrine: Negative for polydipsia.  Genitourinary: Negative  for dysuria and frequency.  Musculoskeletal: Negative for arthralgias and back pain.  Skin: Positive for rash.  Neurological: Negative for tremors, light-headedness and numbness.  Hematological: Does not bruise/bleed easily.  Psychiatric/Behavioral: Negative for agitation and behavioral problems.    Objective:  BP 120/80   Pulse 81   Temp 98.2 F (36.8 C) (Oral)   Ht 5' 7" (1.702 m)   Wt 200 lb 9.6 oz (91 kg)   SpO2 99%   BMI 31.42 kg/m   BP/Weight 02/16/2018 11/13/2017 10/20/2017  Systolic BP 120 105 111  Diastolic BP 80 71 99  Wt. (Lbs) 200.6 211.6 190  BMI 31.42 33.14 29.76      Physical Exam  Constitutional: She is oriented to person, place, and time. She appears well-developed and well-nourished.  Cardiovascular: Normal rate, normal heart sounds and intact distal pulses.  No murmur heard. Pulmonary/Chest: Effort normal and breath sounds normal. She has no wheezes. She has no rales. She exhibits no tenderness.  Abdominal: Soft. Bowel sounds are normal. She exhibits no distension and no mass. There is no tenderness.  Musculoskeletal: Normal range of motion.  Neurological: She is alert and oriented to person, place, and time.  Skin: Skin is warm and dry.  Scaly rash in the middle of the right palm with splitting of ventral aspect of right middle finger  Psychiatric: She has a normal mood and affect.    CMP Latest Ref Rng & Units 02/21/2017 10/30/2015 08/27/2015  Glucose 65 - 99 mg/dL 83 93 111(H)  BUN 7 - 25 mg/dL 10 8 14  Creatinine 0.50 - 1.10 mg/dL 0.64 0.70 0.68  Sodium 135 - 146 mmol/L 139 138 138  Potassium 3.5 - 5.3 mmol/L 4.6 3.9 3.7  Chloride 98 - 110 mmol/L 103 107 107  CO2 20 - 31 mmol/L 29 25 26  Calcium 8.6 - 10.2 mg/dL 9.1 8.5(L) 8.5(L)  Total Protein 6.1 - 8.1 g/dL 6.9 - -  Total Bilirubin 0.2 - 1.2 mg/dL 0.3 - -  Alkaline Phos 33 - 115 U/L 53 - -  AST 10 - 30 U/L 14 - -  ALT 6 - 29 U/L 14 - -    Lipid Panel     Component Value Date/Time   CHOL  214 (H) 02/21/2017 0940   TRIG 202 (H) 02/21/2017 0940   HDL 42 (L) 02/21/2017 0940   CHOLHDL 5.1 (H) 02/21/2017 0940   VLDL 40 (H) 02/21/2017 0940   LDLCALC 132 (H) 02/21/2017 0940       Assessment & Plan:   1. Moderate persistent asthma, uncomplicated Controlled on Singulair with no exacerbations in the last 3 months however she complains of disorders of her mood on Singulair and so has been taking it intermittently Advised to take singular more regularly and if unable to tolerate we might have to discontinue it and refer to Dr. Wright - albuterol (PROVENTIL   HFA;VENTOLIN HFA) 108 (90 Base) MCG/ACT inhaler; Inhale 2 puffs into the lungs every 6 (six) hours as needed for wheezing or shortness of breath.  Dispense: 1 Inhaler; Refill: 3 - mometasone-formoterol (DULERA) 200-5 MCG/ACT AERO; Inhale 2 puffs into the lungs 2 (two) times daily.  Dispense: 1 Inhaler; Refill: 3 - montelukast (SINGULAIR) 10 MG tablet; Take 1 tablet (10 mg total) by mouth at bedtime.  Dispense: 30 tablet; Refill: 3  2. Other chronic sinusitis Stable - fluticasone (FLONASE) 50 MCG/ACT nasal spray; Place 1 spray into both nostrils 2 (two) times daily.  Dispense: 16 g; Refill: 3  3. 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  Dispense: 60 g; Refill: 1  4. Screening for metabolic disorder Screen for diabetes is negative with A1c of 5.1  - CMP14+EGFR; Future - Lipid panel; Future - POCT glycosylated hemoglobin (Hb A1C)  5. Acute adjustment disorder with mixed anxiety and depressed mood Seems to be triggered by Singulair as per patient but she recently commenced a new job which could also be contributing Tried Zoloft in the past and failed If  symptoms return when she resumes Singulair I have given her the option of Prozac which she is open to trying.   Meds ordered this encounter  Medications  . albuterol (PROVENTIL HFA;VENTOLIN HFA) 108 (90 Base) MCG/ACT inhaler    Sig: Inhale 2 puffs into the lungs every 6 (six) hours as needed for wheezing or shortness of breath.    Dispense:  1 Inhaler    Refill:  3  . mometasone-formoterol (DULERA) 200-5 MCG/ACT AERO    Sig: Inhale 2 puffs into the lungs 2 (two) times daily.    Dispense:  1 Inhaler    Refill:  3  . montelukast (SINGULAIR) 10 MG tablet    Sig: Take 1 tablet (10 mg total) by mouth at bedtime.    Dispense:  30 tablet    Refill:  3  . fluticasone (FLONASE) 50 MCG/ACT nasal spray    Sig: Place 1 spray into both nostrils 2 (two) times daily.    Dispense:  16 g    Refill:  3  . fluocinonide-emollient (LIDEX-E) 0.05 % cream    Sig: Apply 1 application topically 2 (two) times daily.    Dispense:  60 g    Refill:  1    Follow-up: Return in about 3 months (around 05/19/2018) for follow up of chronic medical conditions.   Enobong Newlin MD   

## 2018-02-19 ENCOUNTER — Ambulatory Visit: Payer: Self-pay | Attending: Family Medicine

## 2018-02-19 DIAGNOSIS — Z13228 Encounter for screening for other metabolic disorders: Secondary | ICD-10-CM | POA: Insufficient documentation

## 2018-02-19 NOTE — Progress Notes (Signed)
Patient here for lab visit  

## 2018-02-20 LAB — CMP14+EGFR
A/G RATIO: 1.4 (ref 1.2–2.2)
ALT: 16 IU/L (ref 0–32)
AST: 15 IU/L (ref 0–40)
Albumin: 3.9 g/dL (ref 3.5–5.5)
Alkaline Phosphatase: 49 IU/L (ref 39–117)
BUN/Creatinine Ratio: 18 (ref 9–23)
BUN: 11 mg/dL (ref 6–20)
CO2: 23 mmol/L (ref 20–29)
Calcium: 9.2 mg/dL (ref 8.7–10.2)
Chloride: 105 mmol/L (ref 96–106)
Creatinine, Ser: 0.62 mg/dL (ref 0.57–1.00)
GFR, EST AFRICAN AMERICAN: 134 mL/min/{1.73_m2} (ref >59)
GFR, EST NON AFRICAN AMERICAN: 116 mL/min/{1.73_m2} (ref >59)
GLOBULIN, TOTAL: 2.7 g/dL (ref 1.5–4.5)
Glucose: 89 mg/dL (ref 65–99)
POTASSIUM: 4.3 mmol/L (ref 3.5–5.2)
SODIUM: 142 mmol/L (ref 134–144)
TOTAL PROTEIN: 6.6 g/dL (ref 6.0–8.5)

## 2018-02-20 LAB — LIPID PANEL
CHOLESTEROL TOTAL: 248 mg/dL — AB (ref 100–199)
Chol/HDL Ratio: 4.6 ratio — ABNORMAL HIGH (ref 0.0–4.4)
HDL: 54 mg/dL (ref >39)
LDL CALC: 174 mg/dL — AB (ref 0–99)
TRIGLYCERIDES: 102 mg/dL (ref 0–149)
VLDL Cholesterol Cal: 20 mg/dL (ref 5–40)

## 2018-05-21 ENCOUNTER — Ambulatory Visit: Payer: Self-pay | Attending: Family Medicine | Admitting: Family Medicine

## 2018-05-21 ENCOUNTER — Encounter: Payer: Self-pay | Admitting: Family Medicine

## 2018-05-21 VITALS — BP 109/76 | HR 73 | Temp 97.7°F | Ht 67.0 in | Wt 187.8 lb

## 2018-05-21 DIAGNOSIS — L309 Dermatitis, unspecified: Secondary | ICD-10-CM | POA: Insufficient documentation

## 2018-05-21 DIAGNOSIS — K219 Gastro-esophageal reflux disease without esophagitis: Secondary | ICD-10-CM | POA: Insufficient documentation

## 2018-05-21 DIAGNOSIS — L308 Other specified dermatitis: Secondary | ICD-10-CM

## 2018-05-21 DIAGNOSIS — J4540 Moderate persistent asthma, uncomplicated: Secondary | ICD-10-CM

## 2018-05-21 DIAGNOSIS — J329 Chronic sinusitis, unspecified: Secondary | ICD-10-CM | POA: Insufficient documentation

## 2018-05-21 DIAGNOSIS — E7800 Pure hypercholesterolemia, unspecified: Secondary | ICD-10-CM | POA: Insufficient documentation

## 2018-05-21 DIAGNOSIS — Z7951 Long term (current) use of inhaled steroids: Secondary | ICD-10-CM | POA: Insufficient documentation

## 2018-05-21 DIAGNOSIS — Z79899 Other long term (current) drug therapy: Secondary | ICD-10-CM | POA: Insufficient documentation

## 2018-05-21 MED ORDER — MOMETASONE FURO-FORMOTEROL FUM 200-5 MCG/ACT IN AERO: 2.0000 | INHALATION_SPRAY | Freq: Two times a day (BID) | RESPIRATORY_TRACT | 6 refills | Status: DC

## 2018-05-21 MED ORDER — ALBUTEROL SULFATE HFA 108 (90 BASE) MCG/ACT IN AERS: 2.0000 | INHALATION_SPRAY | Freq: Four times a day (QID) | RESPIRATORY_TRACT | 6 refills | Status: DC | PRN

## 2018-05-21 MED ORDER — FLUOCINONIDE-E 0.05 % EX CREA: 1.0000 "application " | TOPICAL_CREAM | Freq: Two times a day (BID) | CUTANEOUS | 1 refills | Status: DC

## 2018-05-21 MED FILL — **DULERA 200 MCG/5 MCG INHA: 200-5 MCG | 15 days supply | Qty: 1 | Fill #2

## 2018-05-21 MED FILL — !VENTOLIN HFA INHALER: 108 (90 BAS | 25 days supply | Qty: 18 | Fill #1

## 2018-05-21 NOTE — Progress Notes (Signed)
Subjective:  Patient ID: Katrina Mitchell, female    DOB: 05/01/1981  Age: 37 y.o. MRN: [id number]  CC: Asthma   HPI [name]  is a 37 year old female with a history of GERD, asthma, chronic sinusitis who presents today for a follow-up visit. Her asthma has been stable and she has to use her MDI just once a week but has been compliant with Dulera; she rarely has to use Singulair.  Her sinusitis and allergies have been stable and she stopped taking Zyrtec, Allegra and is doing just fine.  Her eczema has also been controlled and she really has to use her Lidex cream. She has lost 28 pounds through dietary modifications and other lifestyle changes and is excited about this. Denies acute concerns today. With regards to healthcare maintenance she has not up-to-date on her Pap smear.  Past Medical History:  Diagnosis Date  . Asthma   . Complication of anesthesia   . GERD (gastroesophageal reflux disease)   . Pregnancy as incidental finding     Past Surgical History:  Procedure Laterality Date  . CHOLECYSTECTOMY  2001  . LAPAROSCOPIC TUBAL LIGATION  08/19/2012   Procedure: LAPAROSCOPIC TUBAL LIGATION;  Surgeon: Lazaro Arms, MD;  Location: [facility];  Service: Gynecology;  Laterality: Bilateral;  . lypoma removal  2003   right arm   . NASAL SINUS SURGERY      No Known Allergies     Outpatient Medications Prior to Visit  Medication Sig Dispense Refill  . acetaminophen (TYLENOL) 325 MG tablet Uses as needed for pain    . esomeprazole (NEXIUM) 20 MG capsule Take 20 mg by mouth daily at 12 noon.    . montelukast (SINGULAIR) 10 MG tablet Take 1 tablet (10 mg total) by mouth at bedtime. 30 tablet 3  . albuterol (PROVENTIL HFA;VENTOLIN HFA) 108 (90 Base) MCG/ACT inhaler Inhale 2 puffs into the lungs every 6 (six) hours as needed for wheezing or shortness of breath. 1 Inhaler 3  . mometasone-formoterol (DULERA) 200-5 MCG/ACT AERO Inhale 2 puffs into the lungs 2 (two) times daily. 1  Inhaler 3  . albuterol (PROVENTIL) (2.5 MG/3ML) 0.083% nebulizer solution TAKE 3 MLS BY NEBULIZATION EVERY 6 HOURS AS NEEDED FOR WHEEZING. (Patient not taking: Reported on 05/21/2018) 75 mL 1  . cetirizine (ZYRTEC) 10 MG tablet Take 10 mg by mouth daily.    . fexofenadine (ALLEGRA) 60 MG tablet Take 60 mg by mouth 2 (two) times daily.    . fluticasone (FLONASE) 50 MCG/ACT nasal spray Place 1 spray into both nostrils 2 (two) times daily. (Patient not taking: Reported on 05/21/2018) 16 g 3  . ibuprofen (ADVIL,MOTRIN) 200 MG tablet Per bottle    . fluocinonide-emollient (LIDEX-E) 0.05 % cream Apply 1 application topically 2 (two) times daily. (Patient not taking: Reported on 05/21/2018) 60 g 1  . ipratropium (ATROVENT) 0.02 % nebulizer solution Take 2.5 mLs (0.5 mg total) every 6 (six) hours as needed by nebulization for wheezing or shortness of breath. (Patient not taking: Reported on 05/21/2018) 50 mL 3  . lidocaine (XYLOCAINE) 2 % solution Use as directed 15 mLs in the mouth or throat as needed for mouth pain. (Patient not taking: Reported on 11/13/2017) 100 mL 0   No facility-administered medications prior to visit.     ROS Review of Systems  Constitutional: Negative for activity change, appetite change and fatigue.  HENT: Negative for congestion, sinus pressure and sore throat.   Eyes: Negative for visual disturbance.  Respiratory: Negative for cough, chest tightness, shortness of breath and wheezing.   Cardiovascular: Negative for chest pain and palpitations.  Gastrointestinal: Negative for abdominal distention, abdominal pain and constipation.  Endocrine: Negative for polydipsia.  Genitourinary: Negative for dysuria and frequency.  Musculoskeletal: Negative for arthralgias and back pain.  Skin: Negative for rash.  Neurological: Negative for tremors, light-headedness and numbness.  Hematological: Does not bruise/bleed easily.  Psychiatric/Behavioral: Negative for agitation and behavioral  problems.    Objective:  BP 109/76   Pulse 73   Temp 97.7 F (36.5 C) (Oral)   Ht 5\' 7"  (1.702 m)   Wt 187 lb 12.8 oz (85.2 kg)   SpO2 100%   BMI 29.41 kg/m   BP/Weight 05/21/2018 02/16/2018 11/13/2017  Systolic BP 109 120 105  Diastolic BP 76 80 71  Wt. (Lbs) 187.8 200.6 211.6  BMI 29.41 31.42 33.14      Physical Exam  Constitutional: She is oriented to person, place, and time. She appears well-developed and well-nourished.  Cardiovascular: Normal rate, normal heart sounds and intact distal pulses.  No murmur heard. Pulmonary/Chest: Effort normal and breath sounds normal. She has no wheezes. She has no rales. She exhibits no tenderness.  Abdominal: Soft. Bowel sounds are normal. She exhibits no distension and no mass. There is no tenderness.  Musculoskeletal: Normal range of motion.  Neurological: She is alert and oriented to person, place, and time.  Skin: Skin is warm and dry.    CMP Latest Ref Rng & Units 02/19/2018 02/21/2017 10/30/2015  Glucose 65 - 99 mg/dL 89 83 93  BUN 6 - 20 mg/dL 11 10 8   Creatinine 0.57 - 1.00 mg/dL 1.61 0.96 0.45  Sodium 134 - 144 mmol/L 142 139 138  Potassium 3.5 - 5.2 mmol/L 4.3 4.6 3.9  Chloride 96 - 106 mmol/L 105 103 107  CO2 20 - 29 mmol/L 23 29 25   Calcium 8.7 - 10.2 mg/dL 9.2 9.1 4.0(J)  Total Protein 6.0 - 8.5 g/dL 6.6 6.9 -  Total Bilirubin 0.0 - 1.2 mg/dL <8.1 0.3 -  Alkaline Phos 39 - 117 IU/L 49 53 -  AST 0 - 40 IU/L 15 14 -  ALT 0 - 32 IU/L 16 14 -    Lipid Panel     Component Value Date/Time   CHOL 248 (H) 02/19/2018 0921   TRIG 102 02/19/2018 0921   HDL 54 02/19/2018 0921   CHOLHDL 4.6 (H) 02/19/2018 0921   CHOLHDL 5.1 (H) 02/21/2017 0940   VLDL 40 (H) 02/21/2017 0940   LDLCALC 174 (H) 02/19/2018 0921     Assessment & Plan:   1. Moderate persistent asthma, uncomplicated Stable No acute exacerbation - albuterol (PROVENTIL HFA;VENTOLIN HFA) 108 (90 Base) MCG/ACT inhaler; Inhale 2 puffs into the lungs every 6  (six) hours as needed for wheezing or shortness of breath.  Dispense: 1 Inhaler; Refill: 6 - mometasone-formoterol (DULERA) 200-5 MCG/ACT AERO; Inhale 2 puffs into the lungs 2 (two) times daily.  Dispense: 1 Inhaler; Refill: 6  2. Other eczema Controlled - fluocinonide-emollient (LIDEX-E) 0.05 % cream; Apply 1 application topically 2 (two) times daily.  Dispense: 60 g; Refill: 1  3. Hypercholesterolemia Low cardiovascular risk Advised to work on lifestyle modifications, low-cholesterol diet   Meds ordered this encounter  Medications  . albuterol (PROVENTIL HFA;VENTOLIN HFA) 108 (90 Base) MCG/ACT inhaler    Sig: Inhale 2 puffs into the lungs every 6 (six) hours as needed for wheezing or shortness of breath.    Dispense:  1 Inhaler    Refill:  6  . fluocinonide-emollient (LIDEX-E) 0.05 % cream    Sig: Apply 1 application topically 2 (two) times daily.    Dispense:  60 g    Refill:  1  . mometasone-formoterol (DULERA) 200-5 MCG/ACT AERO    Sig: Inhale 2 puffs into the lungs 2 (two) times daily.    Dispense:  1 Inhaler    Refill:  6    Follow-up: Return in about 1 month (around 06/20/2018) for PAP Smear.   Hoy RegisterEnobong Samari Gorby MD

## 2018-05-21 NOTE — Patient Instructions (Signed)

## 2018-06-17 MED FILL — !DULERA 200 MCG/5 MCG INH: 200-5 | 30 days supply | Qty: 13 | Fill #1

## 2018-07-03 ENCOUNTER — Ambulatory Visit: Payer: Self-pay | Attending: Family Medicine | Admitting: Family Medicine

## 2018-07-03 ENCOUNTER — Encounter: Payer: Self-pay | Admitting: Family Medicine

## 2018-07-03 VITALS — BP 118/80 | HR 83 | Temp 98.1°F | Ht 67.0 in | Wt 177.6 lb

## 2018-07-03 DIAGNOSIS — J45909 Unspecified asthma, uncomplicated: Secondary | ICD-10-CM | POA: Insufficient documentation

## 2018-07-03 DIAGNOSIS — Z124 Encounter for screening for malignant neoplasm of cervix: Secondary | ICD-10-CM

## 2018-07-03 DIAGNOSIS — Z79899 Other long term (current) drug therapy: Secondary | ICD-10-CM | POA: Insufficient documentation

## 2018-07-03 DIAGNOSIS — K219 Gastro-esophageal reflux disease without esophagitis: Secondary | ICD-10-CM | POA: Insufficient documentation

## 2018-07-03 DIAGNOSIS — Z9889 Other specified postprocedural states: Secondary | ICD-10-CM | POA: Insufficient documentation

## 2018-07-03 DIAGNOSIS — Z9049 Acquired absence of other specified parts of digestive tract: Secondary | ICD-10-CM | POA: Insufficient documentation

## 2018-07-03 NOTE — Patient Instructions (Signed)

## 2018-07-03 NOTE — Progress Notes (Signed)
Subjective:  Patient ID: Katrina Mitchell, female    DOB: December 09, 1981  Age: 37 y.o. MRN: [id number]  CC: Gynecologic Exam   HPI CITLALIC NORLANDER  is a 37 year old female with a history of GERD, asthma, chronic sinusitis who presents today for a Pap smear. She denies vaginal discharge, bleeding or urinary symptoms and was last sexually active a couple of days ago. Her period will be coming up in the next week. She has no acute concerns today.  Past Medical History:  Diagnosis Date  . Asthma   . Complication of anesthesia   . GERD (gastroesophageal reflux disease)   . Pregnancy as incidental finding     Past Surgical History:  Procedure Laterality Date  . CHOLECYSTECTOMY  2001  . LAPAROSCOPIC TUBAL LIGATION  08/19/2012   Procedure: LAPAROSCOPIC TUBAL LIGATION;  Surgeon: Lazaro Arms, MD;  Location: [facility];  Service: Gynecology;  Laterality: Bilateral;  . lypoma removal  2003   right arm   . NASAL SINUS SURGERY      No Known Allergies   Outpatient Medications Prior to Visit  Medication Sig Dispense Refill  . acetaminophen (TYLENOL) 325 MG tablet Uses as needed for pain    . albuterol (PROVENTIL HFA;VENTOLIN HFA) 108 (90 Base) MCG/ACT inhaler Inhale 2 puffs into the lungs every 6 (six) hours as needed for wheezing or shortness of breath. 1 Inhaler 6  . albuterol (PROVENTIL) (2.5 MG/3ML) 0.083% nebulizer solution TAKE 3 MLS BY NEBULIZATION EVERY 6 HOURS AS NEEDED FOR WHEEZING. 75 mL 1  . esomeprazole (NEXIUM) 20 MG capsule Take 20 mg by mouth daily at 12 noon.    . fexofenadine (ALLEGRA) 60 MG tablet Take 60 mg by mouth 2 (two) times daily.    . fluocinonide-emollient (LIDEX-E) 0.05 % cream Apply 1 application topically 2 (two) times daily. 60 g 1  . ibuprofen (ADVIL,MOTRIN) 200 MG tablet Per bottle    . mometasone-formoterol (DULERA) 200-5 MCG/ACT AERO Inhale 2 puffs into the lungs 2 (two) times daily. 1 Inhaler 6  . montelukast (SINGULAIR) 10 MG tablet Take 1 tablet (10 mg total)  by mouth at bedtime. 30 tablet 3  . cetirizine (ZYRTEC) 10 MG tablet Take 10 mg by mouth daily.    . fluticasone (FLONASE) 50 MCG/ACT nasal spray Place 1 spray into both nostrils 2 (two) times daily. (Patient not taking: Reported on 05/21/2018) 16 g 3   No facility-administered medications prior to visit.     ROS Review of Systems  Constitutional: Negative for activity change, appetite change and fatigue.  HENT: Negative for congestion, sinus pressure and sore throat.   Eyes: Negative for visual disturbance.  Respiratory: Negative for cough, chest tightness, shortness of breath and wheezing.   Cardiovascular: Negative for chest pain and palpitations.  Gastrointestinal: Negative for abdominal distention, abdominal pain and constipation.  Endocrine: Negative for polydipsia.  Genitourinary: Negative for dysuria and frequency.  Musculoskeletal: Negative for arthralgias and back pain.  Skin: Negative for rash.  Neurological: Negative for tremors, light-headedness and numbness.  Hematological: Does not bruise/bleed easily.  Psychiatric/Behavioral: Negative for agitation and behavioral problems.    Objective:  BP 118/80   Pulse 83   Temp 98.1 F (36.7 C) (Oral)   Ht 5\' 7"  (1.702 m)   Wt 177 lb 9.6 oz (80.6 kg)   SpO2 98%   BMI 27.82 kg/m   BP/Weight 07/03/2018 05/21/2018 02/16/2018  Systolic BP 118 109 120  Diastolic BP 80 76 80  Wt. (Lbs) 177.6  187.8 200.6  BMI 27.82 29.41 31.42    Physical Exam  Constitutional: She is oriented to person, place, and time. She appears well-developed and well-nourished.  Cardiovascular: Normal rate, normal heart sounds and intact distal pulses.  No murmur heard. Pulmonary/Chest: Effort normal and breath sounds normal. She has no wheezes. She has no rales. She exhibits no tenderness.  Abdominal: Soft. Bowel sounds are normal. She exhibits no distension and no mass. There is no tenderness.  Genitourinary:  Genitourinary Comments: Normal external  genitalia, brownish vaginal discharge with no odor,  cervix with slight bleeding from os, normal adnexa  Musculoskeletal: Normal range of motion.  Neurological: She is alert and oriented to person, place, and time.     Assessment & Plan:   1. Screening for cervical cancer - Cytology - PAP([place])   No orders of the defined types were placed in this encounter.   Follow-up: Return in about 6 months (around 01/03/2019) for follow up of chronic medical conditions.   Hoy RegisterEnobong Kaytlin Burklow MD

## 2018-07-06 LAB — CERVICOVAGINAL ANCILLARY ONLY: Herpes: NEGATIVE

## 2018-07-07 LAB — CYTOLOGY - PAP
CHLAMYDIA, DNA PROBE: NEGATIVE
Candida vaginitis: NEGATIVE
Diagnosis: NEGATIVE
HPV: NOT DETECTED
NEISSERIA GONORRHEA: NEGATIVE
TRICH (WINDOWPATH): NEGATIVE

## 2018-07-27 ENCOUNTER — Encounter: Payer: Self-pay | Admitting: Emergency Medicine

## 2018-07-27 ENCOUNTER — Other Ambulatory Visit: Payer: Self-pay

## 2018-07-27 DIAGNOSIS — Z87891 Personal history of nicotine dependence: Secondary | ICD-10-CM | POA: Insufficient documentation

## 2018-07-27 DIAGNOSIS — J441 Chronic obstructive pulmonary disease with (acute) exacerbation: Secondary | ICD-10-CM | POA: Insufficient documentation

## 2018-07-27 DIAGNOSIS — Z79899 Other long term (current) drug therapy: Secondary | ICD-10-CM | POA: Insufficient documentation

## 2018-07-27 NOTE — ED Triage Notes (Signed)
Pt arrived to the ED for complaints of shortness of breath and chest pain secondary to asthma. Pt reports that she has been experiencing asthma for the past 4 days and her routine medication has not helped with the symptoms. Pt is AOx4 in no apparent distress with mild audible wheezing on the right lung.

## 2018-07-28 ENCOUNTER — Emergency Department: Payer: Self-pay

## 2018-07-28 ENCOUNTER — Emergency Department
Admission: EM | Admit: 2018-07-28 | Discharge: 2018-07-28 | Disposition: A | Discharge status: Home / Self Care | Payer: Self-pay | Attending: Emergency Medicine | Admitting: Emergency Medicine

## 2018-07-28 DIAGNOSIS — J4521 Mild intermittent asthma with (acute) exacerbation: Secondary | ICD-10-CM

## 2018-07-28 LAB — BASIC METABOLIC PANEL
ANION GAP: 5 (ref 5–15)
BUN: 10 mg/dL (ref 6–20)
CHLORIDE: 105 mmol/L (ref 98–111)
CO2: 26 mmol/L (ref 22–32)
Calcium: 8.7 mg/dL — ABNORMAL LOW (ref 8.9–10.3)
Creatinine, Ser: 0.68 mg/dL (ref 0.44–1.00)
GFR calc Af Amer: >60 mL/min (ref >60)
GLUCOSE: 86 mg/dL (ref 70–99)
POTASSIUM: 3.2 mmol/L — AB (ref 3.5–5.1)
Sodium: 136 mmol/L (ref 135–145)

## 2018-07-28 LAB — CBC
HCT: 37.5 % (ref 35.0–47.0)
Hemoglobin: 13.1 g/dL (ref 12.0–16.0)
MCH: 28.5 pg (ref 26.0–34.0)
MCHC: 35 g/dL (ref 32.0–36.0)
MCV: 81.3 fL (ref 80.0–100.0)
PLATELETS: 265 10*3/uL (ref 150–440)
RBC: 4.61 MIL/uL (ref 3.80–5.20)
RDW: 14.5 % (ref 11.5–14.5)
WBC: 9.7 10*3/uL (ref 3.6–11.0)

## 2018-07-28 LAB — TROPONIN I: Troponin I: <0.03 ng/mL (ref <0.03)

## 2018-07-28 MED ORDER — IPRATROPIUM-ALBUTEROL 0.5-2.5 (3) MG/3ML IN SOLN
3.0000 mL | Freq: Once | RESPIRATORY_TRACT | Status: AC
  Administered 2018-07-28: 3 mL via RESPIRATORY_TRACT
  Filled 2018-07-28: qty 3

## 2018-07-28 MED ORDER — PREDNISONE 20 MG PO TABS: 60.0000 mg | ORAL_TABLET | Freq: Every day | ORAL | 0 refills | Status: DC

## 2018-07-28 MED ORDER — PREDNISONE 20 MG PO TABS
60.0000 mg | ORAL_TABLET | Freq: Once | ORAL | Status: AC
  Administered 2018-07-28: 60 mg via ORAL
  Filled 2018-07-28: qty 3

## 2018-07-28 NOTE — ED Provider Notes (Signed)
[facility] Emergency Department Provider Note   ____________________________________________   First MD Initiated Contact with Patient 07/28/18 0153     (approximate)  I have reviewed the triage vital signs and the nursing notes.   HISTORY  Chief Complaint Asthma; Cough; and Chest Pain    HPI Katrina Mitchell is a 37 y.o. female who comes into the hospital today with some shortness of breath and chest pain.  The patient has a history of asthma and states that she has been using her inhaler all day.  She is had trouble breathing for a few days and reports that she went to the beach and use her nebulizer all weekend.  She has a mild cough that is nonproductive.  She thought that it was due to humidity.  The patient states that she started having some left-sided chest pains at work whenever she is moving around.  She reports that her chest always feels tight but this pains were different.  The patient states that her pains are gone now but she still short of breath.  She is here today for evaluation.   Past Medical History:  Diagnosis Date  . Asthma   . Complication of anesthesia   . GERD (gastroesophageal reflux disease)   . Pregnancy as incidental finding     Patient Active Problem List   Diagnosis Date Noted  . Hypercholesterolemia 05/21/2018  . Chronic sinusitis 11/13/2017  . Eczema 03/20/2017  . Cervicogenic headache 01/23/2016  . Dizziness 12/27/2015  . Episodic confusion 12/27/2015  . Asthma 01/14/2013  . Status asthmaticus 12/06/2012  . Sterilization 07/01/2012    Past Surgical History:  Procedure Laterality Date  . CHOLECYSTECTOMY  2001  . LAPAROSCOPIC TUBAL LIGATION  08/19/2012   Procedure: LAPAROSCOPIC TUBAL LIGATION;  Surgeon: Lazaro ArmsLuther H Eure, MD;  Location: [facility];  Service: Gynecology;  Laterality: Bilateral;  . lypoma removal  2003   right arm   . NASAL SINUS SURGERY      Prior to Admission medications   Medication Sig Start Date  End Date Taking? Authorizing Provider  acetaminophen (TYLENOL) 325 MG tablet Uses as needed for pain    [provider]  albuterol (PROVENTIL HFA;VENTOLIN HFA) 108 (90 Base) MCG/ACT inhaler Inhale 2 puffs into the lungs every 6 (six) hours as needed for wheezing or shortness of breath. 05/21/18   Hoy RegisterNewlin, Enobong, MD  albuterol (PROVENTIL) (2.5 MG/3ML) 0.083% nebulizer solution TAKE 3 MLS BY NEBULIZATION EVERY 6 HOURS AS NEEDED FOR WHEEZING. 11/13/17   Hoy RegisterNewlin, Enobong, MD  cetirizine (ZYRTEC) 10 MG tablet Take 10 mg by mouth daily.    [provider]  esomeprazole (NEXIUM) 20 MG capsule Take 20 mg by mouth daily at 12 noon.    [provider]  fexofenadine (ALLEGRA) 60 MG tablet Take 60 mg by mouth 2 (two) times daily.    [provider]  fluocinonide-emollient (LIDEX-E) 0.05 % cream Apply 1 application topically 2 (two) times daily. 05/21/18   Hoy RegisterNewlin, Enobong, MD  fluticasone (FLONASE) 50 MCG/ACT nasal spray Place 1 spray into both nostrils 2 (two) times daily. Patient not taking: Reported on 05/21/2018 02/16/18   Hoy RegisterNewlin, Enobong, MD  ibuprofen (ADVIL,MOTRIN) 200 MG tablet Per bottle    [provider]  mometasone-formoterol (DULERA) 200-5 MCG/ACT AERO Inhale 2 puffs into the lungs 2 (two) times daily. 05/21/18   Hoy RegisterNewlin, Enobong, MD  montelukast (SINGULAIR) 10 MG tablet Take 1 tablet (10 mg total) by mouth at bedtime. 02/16/18   Newlin,  Enobong, MD  predniSONE (DELTASONE) 20 MG tablet Take 3 tablets (60 mg total) by mouth daily. 07/28/18   Rebecka Apley, MD    Allergies Patient has no known allergies.  Family History  Problem Relation Age of Onset  . Heart disease Paternal Grandfather   . Cancer Maternal Grandmother     Social History Social History   Tobacco Use  . Smoking status: Former Smoker    Packs/day: 1.00    Years: 2.00    Pack years: 2.00    Types: Cigarettes    Last attempt to quit: 07/10/2011    Years since quitting: 7.0  .  Smokeless tobacco: Never Used  Substance Use Topics  . Alcohol use: Yes    Alcohol/week: 1.0 standard drinks    Types: 1 Glasses of wine per week    Comment: Occ  . Drug use: No    Review of Systems  Constitutional: No fever/chills Eyes: No visual changes. ENT: No sore throat. Cardiovascular:  chest pain. Respiratory:  shortness of breath. Gastrointestinal: No abdominal pain.  No nausea, no vomiting.  No diarrhea.  No constipation. Genitourinary: Negative for dysuria. Musculoskeletal: Negative for back pain. Skin: Negative for rash. Neurological: Negative for headaches, focal weakness or numbness.   ____________________________________________   PHYSICAL EXAM:  VITAL SIGNS: ED Triage Vitals  Enc Vitals Group     BP 07/27/18 2349 130/82     Pulse Rate 07/27/18 2349 86     Resp 07/27/18 2349 16     Temp 07/27/18 2349 98.4 F (36.9 C)     Temp Source 07/27/18 2349 Oral     SpO2 07/27/18 2349 100 %     Weight 07/27/18 2350 184 lb (83.5 kg)     Height 07/27/18 2350 5\' 7"  (1.702 m)     Head Circumference --      Peak Flow --      Pain Score 07/27/18 2350 4     Pain Loc --      Pain Edu? --      Excl. in GC? --     Constitutional: Alert and oriented. Well appearing and in moderate distress. Eyes: Conjunctivae are normal. PERRL. EOMI. Head: Atraumatic. Nose: No congestion/rhinnorhea. Mouth/Throat: Mucous membranes are moist.  Oropharynx non-erythematous. Cardiovascular: Normal rate, regular rhythm. Grossly normal heart sounds.  Good peripheral circulation. Respiratory: Normal respiratory effort.  No retractions.  Expiratory wheezes in all lung fields Gastrointestinal: Soft and nontender. No distention.  Positive bowel sounds Musculoskeletal: No lower extremity tenderness nor edema.   Neurologic:  Normal speech and language.  Skin:  Skin is warm, dry and intact.  Psychiatric: Mood and affect are normal.   ____________________________________________   LABS (all  labs ordered are listed, but only abnormal results are displayed)  Labs Reviewed  BASIC METABOLIC PANEL - Abnormal; Notable for the following components:      Result Value   Potassium 3.2 (*)    Calcium 8.7 (*)    All other components within normal limits  CBC  TROPONIN I  POC URINE PREG, ED   ____________________________________________  EKG  ED ECG REPORT I, Rebecka Apley, the attending physician, personally viewed and interpreted this ECG.   Date: 07/27/2018  EKG Time: 2346  Rate: 83  Rhythm: normal sinus rhythm  Axis: normal  Intervals:none  ST&T Change: none  ____________________________________________  RADIOLOGY  ED MD interpretation:  CXR: Hyperinflation with central bronchial thickening suggesting bronchitis or asthma.  Official radiology report(s): Dg Chest 2  View  Result Date: 07/28/2018 CLINICAL DATA:  Shortness of breath.  Chest pain. EXAM: CHEST - 2 VIEW COMPARISON:  Radiographs 10/20/2017 FINDINGS: Central bronchial thickening with borderline hyperinflation.The cardiomediastinal contours are normal. Pulmonary vasculature is normal. No consolidation, pleural effusion, or pneumothorax. No acute osseous abnormalities are seen. IMPRESSION: Hyperinflation with central bronchial thickening suggesting bronchitis or asthma. Electronically Signed   By: Rubye OaksMelanie  Ehinger M.D.   On: 07/28/2018 01:05    ____________________________________________   PROCEDURES  Procedure(s) performed: None  Procedures  Critical Care performed: No  ____________________________________________   INITIAL IMPRESSION / ASSESSMENT AND PLAN / ED COURSE  As part of my medical decision making, I reviewed the following data within the electronic MEDICAL RECORD NUMBER Notes from prior ED visits and [place] Controlled Substance Database   This is a 37 year old female who comes into the hospital today with some shortness of breath.  The patient does have some asthma but she did have chest  pain which is what concerned her.  The patient did have some blood work drawn to include a troponin which was unremarkable.  The patient though does have some tight expiratory wheezes on exam.  I will give the patient 2 DuoNeb's and a dose of prednisone.  She will be reassessed.  The patient was feeling much improved after the medication.  I listen to her lungs and her wheezing was improved.  I will discharge the patient home with some steroids and encouraged her to continue using her inhaler.  She should follow-up with her primary care physician for further evaluation.      ____________________________________________   FINAL CLINICAL IMPRESSION(S) / ED DIAGNOSES  Final diagnoses:  Mild intermittent asthma with acute exacerbation     ED Discharge Orders         Ordered    predniSONE (DELTASONE) 20 MG tablet  Daily     07/28/18 0405           Note:  This document was prepared using Dragon voice recognition software and may include unintentional dictation errors.    Rebecka ApleyWebster, Allison P, MD 07/28/18 [phone]

## 2018-07-28 NOTE — ED Notes (Signed)
Pt discharged to home.  Family member driving.  Discharge instructions reviewed.  Verbalized understanding.  No questions or concerns at this time.  Teach back verified.  Pt in NAD.  No items left in ED.   

## 2018-07-28 NOTE — Discharge Instructions (Addendum)
Please follow-up with your primary care physician for further evaluation of your asthma exacerbation.  Please continue using your inhaler every 4-6 hours as needed for shortness of breath.  Please return with any worsening symptoms or any other concerns.

## 2018-08-03 MED FILL — !DULERA 200 MCG/5 MCG INH: 200-5 | 30 days supply | Qty: 13 | Fill #2

## 2018-08-03 MED FILL — FLUOCINONIDE EMULSIFIED BAS: 0.05 | 15 days supply | Qty: 30 | Fill #1

## 2018-08-03 MED FILL — ?MONTELUKAST SODI 10MG TAB: 10 | 30 days supply | Qty: 30 | Fill #2

## 2018-08-03 MED FILL — !VENTOLIN HFA INHALER: 108 (90 BAS | 25 days supply | Qty: 18 | Fill #2

## 2018-08-11 ENCOUNTER — Ambulatory Visit: Payer: Self-pay | Attending: Family Medicine

## 2018-08-13 ENCOUNTER — Ambulatory Visit: Payer: Self-pay | Attending: Family Medicine | Admitting: Physician Assistant

## 2018-08-13 VITALS — BP 106/70 | HR 82 | Temp 99.2°F | Resp 18 | Ht 67.0 in | Wt 181.0 lb

## 2018-08-13 DIAGNOSIS — Z09 Encounter for follow-up examination after completed treatment for conditions other than malignant neoplasm: Secondary | ICD-10-CM

## 2018-08-13 DIAGNOSIS — Z79899 Other long term (current) drug therapy: Secondary | ICD-10-CM | POA: Insufficient documentation

## 2018-08-13 DIAGNOSIS — Z7952 Long term (current) use of systemic steroids: Secondary | ICD-10-CM | POA: Insufficient documentation

## 2018-08-13 DIAGNOSIS — Z87891 Personal history of nicotine dependence: Secondary | ICD-10-CM | POA: Insufficient documentation

## 2018-08-13 DIAGNOSIS — K219 Gastro-esophageal reflux disease without esophagitis: Secondary | ICD-10-CM | POA: Insufficient documentation

## 2018-08-13 DIAGNOSIS — Z23 Encounter for immunization: Secondary | ICD-10-CM | POA: Insufficient documentation

## 2018-08-13 DIAGNOSIS — Z7951 Long term (current) use of inhaled steroids: Secondary | ICD-10-CM | POA: Insufficient documentation

## 2018-08-13 DIAGNOSIS — Z791 Long term (current) use of non-steroidal anti-inflammatories (NSAID): Secondary | ICD-10-CM | POA: Insufficient documentation

## 2018-08-13 DIAGNOSIS — J4540 Moderate persistent asthma, uncomplicated: Secondary | ICD-10-CM | POA: Insufficient documentation

## 2018-08-13 MED ORDER — PREDNISONE 10 MG PO TABS: ORAL_TABLET | ORAL | 0 refills | Status: DC

## 2018-08-13 MED FILL — predniSONE 10 MG TABS: 10 | 6 days supply | Qty: 21 | Fill #0

## 2018-08-13 NOTE — Progress Notes (Signed)
Patient ID: Katrina Mitchell, female   DOB: April 03, 1981, 37 y.o.   MRN: [id number]       Katrina Mitchell, is a 37 y.o. female  WUJ:[id number]  [id number]  DOB - 08/11/1981  Subjective:  Chief Complaint and HPI: Katrina Mitchell is a 37 y.o. female here today for a follow up visit After being seen in the ED 07/28/2018 for SOB/CP.  Cardiac enzymes and EKG were WNL.  CXR showed bronchitic thickening/asthmatic changes.  Today she presents feeling about 90% better but still wheezing some after the 4 day prednisone taper.  She is on dulera and singulair and even when she is not in an exacerbation she has to use her albuterol frequently.  Her PCP had told her she would refer to pulmonology if this persisted.  No f/c.  No discolored sputum.  Former smoker.  Social:  Recent travel to the beach and mowing her lawn.  Former smoker.    From A/P8/20/2019: This is a 37 year old female who comes into the hospital today with some shortness of breath.  The patient does have some asthma but she did have chest pain which is what concerned her.  The patient did have some blood work drawn to include a troponin which was unremarkable.  The patient though does have some tight expiratory wheezes on exam.  I will give the patient 2 DuoNeb's and a dose of prednisone.  She will be reassessed.  The patient was feeling much improved after the medication.  I listen to her lungs and her wheezing was improved.  I will discharge the patient home with some steroids and encouraged her to continue using her inhaler.  She should follow-up with her primary care physician for further evaluation.  ED/Hospital notes reviewed and summarized above  ROS:   Constitutional:  No f/c, No night sweats, No unexplained weight loss. EENT:  No vision changes, No blurry vision, No hearing changes. No mouth, throat, or ear problems.  Respiratory: No cough, some SOB Cardiac: No CP, no palpitations GI:  No abd pain, No N/V/D. GU: No Urinary  s/sx Musculoskeletal: No joint pain Neuro: No headache, no dizziness, no motor weakness.  Skin: No rash Endocrine:  No polydipsia. No polyuria.  Psych: Denies SI/HI  No problems updated.  ALLERGIES: No Known Allergies  PAST MEDICAL HISTORY: Past Medical History:  Diagnosis Date  . Asthma   . Complication of anesthesia   . GERD (gastroesophageal reflux disease)   . Pregnancy as incidental finding     MEDICATIONS AT HOME: Prior to Admission medications   Medication Sig Start Date End Date Taking? Authorizing Provider  acetaminophen (TYLENOL) 325 MG tablet Uses as needed for pain   Yes [provider]  albuterol (PROVENTIL HFA;VENTOLIN HFA) 108 (90 Base) MCG/ACT inhaler Inhale 2 puffs into the lungs every 6 (six) hours as needed for wheezing or shortness of breath. 05/21/18  Yes Newlin, Enobong, MD  albuterol (PROVENTIL) (2.5 MG/3ML) 0.083% nebulizer solution TAKE 3 MLS BY NEBULIZATION EVERY 6 HOURS AS NEEDED FOR WHEEZING. 11/13/17  Yes Newlin, Enobong, MD  cetirizine (ZYRTEC) 10 MG tablet Take 10 mg by mouth daily.   Yes [provider]  esomeprazole (NEXIUM) 20 MG capsule Take 20 mg by mouth daily at 12 noon.   Yes [provider]  fexofenadine (ALLEGRA) 60 MG tablet Take 60 mg by mouth 2 (two) times daily.   Yes [provider]  fluocinonide-emollient (LIDEX-E) 0.05 % cream Apply 1 application topically 2 (two) times daily. 05/21/18  Yes Newlin, Enobong, MD  fluticasone (FLONASE) 50 MCG/ACT nasal spray Place 1 spray into both nostrils 2 (two) times daily. 02/16/18  Yes Hoy Register, MD  ibuprofen (ADVIL,MOTRIN) 200 MG tablet Per bottle   Yes [provider]  mometasone-formoterol (DULERA) 200-5 MCG/ACT AERO Inhale 2 puffs into the lungs 2 (two) times daily. 05/21/18  Yes Newlin, Odette Horns, MD  montelukast (SINGULAIR) 10 MG tablet Take 1 tablet (10 mg total) by mouth at bedtime. 02/16/18  Yes Hoy Register, MD  predniSONE (DELTASONE) 10 MG  tablet 6,5,4,3,2,1 take each days dose all at once in am with food. 08/13/18   Anders Simmonds, PA-C     Objective:  EXAM:   Vitals:   08/13/18 1002  BP: 106/70  Pulse: 82  Resp: 18  Temp: 99.2 F (37.3 C)  TempSrc: Oral  SpO2: 98%  Weight: 181 lb (82.1 kg)  Height: 5\' 7"  (1.702 m)    General appearance : A&OX3. NAD. Non-toxic-appearing HEENT: Atraumatic and Normocephalic.  PERRLA. EOM intact.  TM clear B. Mouth-MMM, post pharynx WNL w/o erythema, No PND. Neck: supple, no JVD. No cervical lymphadenopathy. No thyromegaly Chest/Lungs:  Breathing-non-labored, Good air entry bilaterally, breath sounds without rales or rhonchi, but there is moderate wheezing with forced expiration  CVS: S1 S2 regular, no murmurs, gallops, rubs  Extremities: Bilateral Lower Ext shows no edema, both legs are warm to touch with = pulse throughout Neurology:  CN II-XII grossly intact, Non focal.   Psych:  TP linear. J/I WNL. Normal speech. Appropriate eye contact and affect.  Skin:  No Rash  Data Review Lab Results  Component Value Date   HGBA1C 5.1 02/16/2018   HGBA1C 5.7 (H) 04/06/2015     Assessment & Plan   1. Moderate persistent asthma, uncomplicated - predniSONE (DELTASONE) 10 MG tablet; 6,5,4,3,2,1 take each days dose all at once in am with food.  Dispense: 21 tablet; Refill: 0 - Ambulatory referral to Pulmonology Continue dulera/singulair.  2. Need for immunization against influenza - Flu Vaccine QUAD 36+ mos IM  3. Encounter for examination following treatment at hospital Much improved.      Patient have been counseled extensively about nutrition and exercise  Return in about 3 months (around 11/12/2018) for Dr Alvis Lemmings; asthma/reflux.  The patient was given clear instructions to go to ER or return to medical center if symptoms don't improve, worsen or new problems develop. The patient verbalized understanding. The patient was told to call to get lab results if they haven't  heard anything in the next week.     Georgian Co, PA-C [facility] [place], [place] [phone]   08/13/2018, 10:16 AM

## 2018-09-09 MED FILL — DULERA 200 MCG/5 MCG INH: 200-5 | 30 days supply | Qty: 13 | Fill #3

## 2018-09-15 MED FILL — ?MONTELUKAST SODI 10MG TAB: 10 | 30 days supply | Qty: 30 | Fill #3

## 2018-10-21 MED FILL — !DULERA 200 MCG/5 MCG INH: 200-5 | 15 days supply | Qty: 1 | Fill #3

## 2018-10-21 MED FILL — !VENTOLIN HFA INHALER: 108 (90 BAS | 25 days supply | Qty: 18 | Fill #2

## 2018-10-30 ENCOUNTER — Ambulatory Visit: Payer: Self-pay | Attending: Family Medicine | Admitting: Physician Assistant

## 2018-10-30 VITALS — BP 127/82 | HR 114 | Temp 97.8°F | Ht 67.0 in | Wt 181.8 lb

## 2018-10-30 DIAGNOSIS — N3001 Acute cystitis with hematuria: Secondary | ICD-10-CM | POA: Insufficient documentation

## 2018-10-30 DIAGNOSIS — K219 Gastro-esophageal reflux disease without esophagitis: Secondary | ICD-10-CM | POA: Insufficient documentation

## 2018-10-30 DIAGNOSIS — R350 Frequency of micturition: Secondary | ICD-10-CM | POA: Insufficient documentation

## 2018-10-30 DIAGNOSIS — Z79899 Other long term (current) drug therapy: Secondary | ICD-10-CM | POA: Insufficient documentation

## 2018-10-30 DIAGNOSIS — Z3202 Encounter for pregnancy test, result negative: Secondary | ICD-10-CM

## 2018-10-30 DIAGNOSIS — M549 Dorsalgia, unspecified: Secondary | ICD-10-CM

## 2018-10-30 DIAGNOSIS — J4540 Moderate persistent asthma, uncomplicated: Secondary | ICD-10-CM | POA: Insufficient documentation

## 2018-10-30 DIAGNOSIS — Z791 Long term (current) use of non-steroidal anti-inflammatories (NSAID): Secondary | ICD-10-CM | POA: Insufficient documentation

## 2018-10-30 DIAGNOSIS — M546 Pain in thoracic spine: Secondary | ICD-10-CM | POA: Insufficient documentation

## 2018-10-30 LAB — POCT URINALYSIS DIP (CLINITEK)
BILIRUBIN UA: NEGATIVE
BILIRUBIN UA: NEGATIVE mg/dL
GLUCOSE UA: NEGATIVE mg/dL
Nitrite, UA: NEGATIVE
POC PROTEIN,UA: NEGATIVE
RBC UA: NEGATIVE
SPEC GRAV UA: 1.015 (ref 1.010–1.025)
UROBILINOGEN UA: 0.2 U/dL
pH, UA: 7 (ref 5.0–8.0)

## 2018-10-30 LAB — POCT URINE PREGNANCY: Preg Test, Ur: NEGATIVE

## 2018-10-30 MED ORDER — NITROFURANTOIN MONOHYD MACRO 100 MG PO CAPS: 100.0000 mg | ORAL_CAPSULE | Freq: Two times a day (BID) | ORAL | 0 refills | Status: DC

## 2018-10-30 MED ORDER — FLUCONAZOLE 150 MG PO TABS: 150.0000 mg | ORAL_TABLET | Freq: Once | ORAL | 0 refills | Status: AC

## 2018-10-30 MED ORDER — METHOCARBAMOL 500 MG PO TABS: ORAL_TABLET | ORAL | 0 refills | Status: DC

## 2018-10-30 MED ORDER — NAPROXEN 500 MG PO TABS: 500.0000 mg | ORAL_TABLET | Freq: Two times a day (BID) | ORAL | 0 refills | Status: DC

## 2018-10-30 NOTE — Patient Instructions (Signed)
Drink 80 ounces water daily °

## 2018-10-30 NOTE — Progress Notes (Signed)
Patient ID: Katrina Mitchell, female   DOB: 05/26/1981, 37 y.o.   MRN: [id number]     Katrina Mitchell Mitchell, is a 37 y.o. female  [id number]  [id number]  DOB - 04/22/1981  Subjective:  Chief Complaint and HPI: Katrina Mitchell is a 37 y.o. female here today w/ R thoracic back pain for 1 week.  NKI.  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  No rash.  No abdominal pain.  No fever.  No UE weakness or paresthesias.  Pain was sharp and intermittent.  No alleviating or exacerbating factors.  Moderate intensity and sharp in nature.  Also c/o urinary frequency and urine "cloudiness" for the last few days.  No dysuria.  No vaginal d/c or pelvic pain.  No pain in CVA area.  No N/V.    ROS:   Constitutional:  No f/c, No night sweats, No unexplained weight loss. EENT:  No vision changes, No blurry vision, No hearing changes. No mouth, throat, or ear problems.  Respiratory: No cough, No SOB Cardiac: No CP, no palpitations GI:  No abd pain, No N/V/D. GU: + Urinary s/sx Musculoskeletal: R upper back pain Neuro: No headache, no dizziness, no motor weakness.  Skin: No rash Endocrine:  No polydipsia. No polyuria.  Psych: Denies SI/HI  No problems updated.  ALLERGIES: No Known Allergies  PAST MEDICAL HISTORY: Past Medical History:  Diagnosis Date  . Asthma   . Complication of anesthesia   . GERD (gastroesophageal reflux disease)   . Pregnancy as incidental finding     MEDICATIONS AT HOME: Prior to Admission medications   Medication Sig Start Date End Date Taking? Authorizing Provider  acetaminophen (TYLENOL) 325 MG tablet Uses as needed for pain   Yes [provider]  albuterol (PROVENTIL HFA;VENTOLIN HFA) 108 (90 Base) MCG/ACT inhaler Inhale 2 puffs into the lungs every 6 (six) hours as needed for wheezing or shortness of breath. 05/21/18  Yes Newlin, Enobong, MD  albuterol (PROVENTIL) (2.5 MG/3ML) 0.083% nebulizer solution TAKE 3 MLS BY NEBULIZATION EVERY 6 HOURS AS NEEDED  FOR WHEEZING. 11/13/17  Yes Newlin, Enobong, MD  cetirizine (ZYRTEC) 10 MG tablet Take 10 mg by mouth daily.   Yes [provider]  esomeprazole (NEXIUM) 20 MG capsule Take 20 mg by mouth daily at 12 noon.   Yes [provider]  fexofenadine (ALLEGRA) 60 MG tablet Take 60 mg by mouth 2 (two) times daily.   Yes [provider]  fluocinonide-emollient (LIDEX-E) 0.05 % cream Apply 1 application topically 2 (two) times daily. 05/21/18  Yes Hoy RegisterNewlin, Enobong, MD  ibuprofen (ADVIL,MOTRIN) 200 MG tablet Per bottle   Yes [provider]  mometasone-formoterol (DULERA) 200-5 MCG/ACT AERO Inhale 2 puffs into the lungs 2 (two) times daily. 05/21/18  Yes Newlin, Odette HornsEnobong, MD  montelukast (SINGULAIR) 10 MG tablet Take 1 tablet (10 mg total) by mouth at bedtime. 02/16/18  Yes Newlin, Odette HornsEnobong, MD  fluticasone (FLONASE) 50 MCG/ACT nasal spray Place 1 spray into both nostrils 2 (two) times daily. Patient not taking: Reported on 10/30/2018 02/16/18   Hoy RegisterNewlin, Enobong, MD  methocarbamol (ROBAXIN) 500 MG tablet 1-2 tid X 5 days then prn muscle spasm 10/30/18   Georgian CoMcClung, Angela M, PA-C  naproxen (NAPROSYN) 500 MG tablet Take 1 tablet (500 mg total) by mouth 2 (two) times daily with a meal. X 5 days then prn pain 10/30/18   Anders SimmondsMcClung, Angela M, PA-C     Objective:  EXAM:   Vitals:   10/30/18  1603  BP: 127/82  Pulse: (!) 114  Temp: 97.8 F (36.6 C)  TempSrc: Oral  SpO2: 98%  Weight: 181 lb 12.8 oz (82.5 kg)  Height: 5\' 7"  (1.702 m)    General appearance : A&OX3. NAD. Non-toxic-appearing HEENT: Atraumatic and Normocephalic.  PERRLA. EOM intact.  TM clear B. Mouth-MMM, post pharynx WNL w/o erythema, No PND. Neck: supple, no JVD. No cervical lymphadenopathy. No thyromegaly Chest/Lungs:  Breathing-non-labored, Good air entry bilaterally, breath sounds normal without rales or rhonchi.  There is mild wheezing B bases CVS: S1 S2 regular, no murmurs, gallops, rubs (rate at 82 on  exam) Abdomen: Bowel sounds present, Non tender and not distended with no gaurding, rigidity or rebound.  No CVA TTP.  Pain is in R lateral thoracic region.  No TTP.  There is mild muscle spasm and tenderness.  No rash or skin dysthesia.  Extremities: Bilateral Lower Ext shows no edema, both legs are warm to touch with = pulse throughout Neurology:  CN II-XII grossly intact, Non focal.   Psych:  TP linear. J/I WNL. Normal speech. Appropriate eye contact and affect.  Skin:  No Rash  Data Review Lab Results  Component Value Date   HGBA1C 5.1 02/16/2018   HGBA1C 5.7 (H) 04/06/2015     Assessment & Plan   1. Upper back pain on right side Likely musculoskeletal- - naproxen (NAPROSYN) 500 MG tablet; Take 1 tablet (500 mg total) by mouth 2 (two) times daily with a meal. X 5 days then prn pain  Dispense: 60 tablet; Refill: 0 - methocarbamol (ROBAXIN) 500 MG tablet; 1-2 tid X 5 days then prn muscle spasm  Dispense: 90 tablet; Refill: 0  2. Moderate persistent asthma, uncomplicated (she did not have any respiratory conmplaints today, but does have mild wheezing B lung bases today) Uncontrolled but stable and just took oral prednisone <1 month ago.  Continue Dulera, singulair, zytec and see Dr Delford Field for uncontrolled asthma within 1 month.  RTC sooner if worsens.  3. Urinary frequency Drink 80 ounces water daily - POCT URINALYSIS DIP (CLINITEK) - POCT urine pregnancy - Urine cytology ancillary only -Urine culture sent macrobid 100mg  bid X 5 days, diflucan sent if needed   Patient have been counseled extensively about nutrition and exercise  Return for next available appt with Dr Delford Field for asthma work-up.  The patient was given clear instructions to go to ER or return to medical center if symptoms don't improve, worsen or new problems develop. The patient verbalized understanding. The patient was told to call to get lab results if they haven't heard anything in the next week.      Georgian Co, PA-C [facility] [place], [place] [phone]   10/30/2018, 4:32 PM

## 2018-11-01 LAB — URINE CULTURE

## 2018-11-02 MED FILL — FLUCONAZOLE 150 MG TABS: 150 | 1 days supply | Qty: 1 | Fill #0

## 2018-11-02 MED FILL — NAPROXEN 500 MG TABLET: 500 | 30 days supply | Qty: 60 | Fill #0

## 2018-11-02 MED FILL — METHOCARBAMOL 500 MG TABS: 500 | 15 days supply | Qty: 90 | Fill #0

## 2018-11-02 MED FILL — NITROFURANTOIN MONO-MCR 100: 100 | 5 days supply | Qty: 10 | Fill #0

## 2018-11-03 LAB — URINE CYTOLOGY ANCILLARY ONLY
BACTERIAL VAGINITIS: POSITIVE — AB
Candida vaginitis: NEGATIVE

## 2018-11-04 ENCOUNTER — Other Ambulatory Visit: Payer: Self-pay | Admitting: Physician Assistant

## 2018-11-04 LAB — URINE CYTOLOGY ANCILLARY ONLY
CHLAMYDIA, DNA PROBE: NEGATIVE
Neisseria Gonorrhea: NEGATIVE
TRICH (WINDOWPATH): NEGATIVE

## 2018-11-04 MED ORDER — METRONIDAZOLE 500 MG PO TABS: 500.0000 mg | ORAL_TABLET | Freq: Two times a day (BID) | ORAL | 0 refills | Status: DC

## 2018-11-04 MED FILL — ?METRONIDAZOLE 500MG TABS: 500 | 7 days supply | Qty: 14 | Fill #0

## 2018-11-11 ENCOUNTER — Telehealth: Payer: Self-pay | Admitting: Family Medicine

## 2018-11-11 ENCOUNTER — Encounter: Payer: Self-pay | Admitting: Critical Care Medicine

## 2018-11-11 ENCOUNTER — Ambulatory Visit: Payer: Self-pay | Attending: Critical Care Medicine | Admitting: Critical Care Medicine

## 2018-11-11 VITALS — BP 110/74 | HR 92 | Temp 98.0°F | Resp 12 | Wt 184.0 lb

## 2018-11-11 DIAGNOSIS — Z87891 Personal history of nicotine dependence: Secondary | ICD-10-CM | POA: Insufficient documentation

## 2018-11-11 DIAGNOSIS — K219 Gastro-esophageal reflux disease without esophagitis: Secondary | ICD-10-CM | POA: Insufficient documentation

## 2018-11-11 DIAGNOSIS — J328 Other chronic sinusitis: Secondary | ICD-10-CM

## 2018-11-11 DIAGNOSIS — B9689 Other specified bacterial agents as the cause of diseases classified elsewhere: Secondary | ICD-10-CM | POA: Insufficient documentation

## 2018-11-11 DIAGNOSIS — Z791 Long term (current) use of non-steroidal anti-inflammatories (NSAID): Secondary | ICD-10-CM | POA: Insufficient documentation

## 2018-11-11 DIAGNOSIS — Z79899 Other long term (current) drug therapy: Secondary | ICD-10-CM | POA: Insufficient documentation

## 2018-11-11 DIAGNOSIS — J329 Chronic sinusitis, unspecified: Secondary | ICD-10-CM | POA: Insufficient documentation

## 2018-11-11 DIAGNOSIS — J4540 Moderate persistent asthma, uncomplicated: Secondary | ICD-10-CM | POA: Insufficient documentation

## 2018-11-11 MED ORDER — AEROCHAMBER MV MISC: 0 refills | Status: AC

## 2018-11-11 MED ORDER — FEXOFENADINE HCL 180 MG PO TABS: 180.0000 mg | ORAL_TABLET | Freq: Every day | ORAL | Status: AC

## 2018-11-11 MED ORDER — FLUTICASONE PROPIONATE 50 MCG/ACT NA SUSP: 2.0000 | Freq: Every day | NASAL | 3 refills | Status: AC

## 2018-11-11 MED ORDER — AEROCHAMBER MV MISC: 0 refills | Status: DC

## 2018-11-11 MED ORDER — MOMETASONE FURO-FORMOTEROL FUM 200-5 MCG/ACT IN AERO: 2.0000 | INHALATION_SPRAY | Freq: Two times a day (BID) | RESPIRATORY_TRACT | 6 refills | Status: DC

## 2018-11-11 NOTE — Progress Notes (Signed)
Subjective:    Patient ID: Katrina Mitchell, female    DOB: 19-Jan-1981, 37 y.o.   MRN: [id number]  37 y.o. F here for asthma referral from A McClung PA.  Has been on Dulera. Only 1 ED visit for asthma in 6 months, no admissions visits   Last ED visit 07/2018 Dx with asthma with first daughter birth 68yrs ago in December 2013 the patient was intubated overnight for acute respiratory failure secondary to asthma flare Renting home and mold in window unit  Allergy testing 2009 in [facility]   Prior sinus surgery  Dust mite, cat , trees, grasses, ?mold allergy   Asthma  She complains of cough, frequent throat clearing, hoarse voice and wheezing. There is no chest tightness, difficulty breathing, hemoptysis, shortness of breath or sputum production. This is a recurrent problem. The current episode started more than 1 year ago. The problem occurs rarely. The problem has been rapidly improving. The cough is non-productive (worse in the AM with mucus). Associated symptoms include dyspnea on exertion, heartburn, malaise/fatigue, myalgias, nasal congestion, postnasal drip, rhinorrhea, sneezing and a sore throat. Pertinent negatives include no ear congestion or headaches. Her symptoms are aggravated by exposure to fumes, pollen, URI, exposure to smoke, animal exposure, change in weather and emotional stress. 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 Her past medical history is significant for asthma, bronchitis and pneumonia.   Past Medical History:  Diagnosis Date  . Asthma   . Complication of anesthesia   . GERD (gastroesophageal reflux disease)   . Pregnancy as incidental finding      Family History  Problem Relation Age of Onset  . Heart disease Paternal Grandfather   . Cancer Maternal Grandmother      Social History   Socioeconomic History  . Marital status: Divorced    Spouse name: Not on  file  . Number of children: 3  . Years of education: Not on file  . Highest education level: Not on file  Occupational History  . Occupation: [employer]  Social Needs  . Financial resource strain: Not on file  . Food insecurity:    Worry: Not on file    Inability: Not on file  . Transportation needs:    Medical: Not on file    Non-medical: Not on file  Tobacco Use  . Smoking status: Former Smoker    Packs/day: 1.00    Years: 2.00    Pack years: 2.00    Types: Cigarettes    Last attempt to quit: 07/10/2011    Years since quitting: 7.3  . Smokeless tobacco: Never Used  Substance and Sexual Activity  . Alcohol use: Yes    Alcohol/week: 1.0 standard drinks    Types: 1 Glasses of wine per week    Comment: Occ  . Drug use: No  . Sexual activity: Yes    Birth control/protection: Other-see comments    Comment: 6 weeks postpartum  Lifestyle  . Physical activity:    Days per week: Not on file    Minutes per session: Not on file  . Stress: Not on file  Relationships  . Social connections:    Talks on phone: Not on file    Gets together: Not on file    Attends religious service: Not on file    Active member of club or organization: Not on file    Attends meetings of  clubs or organizations: Not on file    Relationship status: Not on file  . Intimate partner violence:    Fear of current or ex partner: Not on file    Emotionally abused: Not on file    Physically abused: Not on file    Forced sexual activity: Not on file  Other Topics Concern  . Not on file  Social History Narrative  . Not on file     No Known Allergies   Outpatient Medications Prior to Visit  Medication Sig Dispense Refill  . acetaminophen (TYLENOL) 325 MG tablet Uses as needed for pain    . albuterol (PROVENTIL HFA;VENTOLIN HFA) 108 (90 Base) MCG/ACT inhaler Inhale 2 puffs into the lungs every 6 (six) hours as needed for wheezing or shortness of breath. 1 Inhaler 6  . albuterol (PROVENTIL) (2.5 MG/3ML)  0.083% nebulizer solution TAKE 3 MLS BY NEBULIZATION EVERY 6 HOURS AS NEEDED FOR WHEEZING. 75 mL 1  . cetirizine (ZYRTEC) 10 MG tablet Take 10 mg by mouth daily.    Marland Kitchen esomeprazole (NEXIUM) 20 MG capsule Take 20 mg by mouth daily at 12 noon.    . fexofenadine (ALLEGRA) 60 MG tablet Take 60 mg by mouth 2 (two) times daily.    . fluocinonide-emollient (LIDEX-E) 0.05 % cream Apply 1 application topically 2 (two) times daily. 60 g 1  . fluticasone (FLONASE) 50 MCG/ACT nasal spray Place 1 spray into both nostrils 2 (two) times daily. (Patient not taking: Reported on 10/30/2018) 16 g 3  . ibuprofen (ADVIL,MOTRIN) 200 MG tablet Per bottle    . methocarbamol (ROBAXIN) 500 MG tablet 1-2 tid X 5 days then prn muscle spasm 90 tablet 0  . metroNIDAZOLE (FLAGYL) 500 MG tablet Take 1 tablet (500 mg total) by mouth 2 (two) times daily. 14 tablet 0  . mometasone-formoterol (DULERA) 200-5 MCG/ACT AERO Inhale 2 puffs into the lungs 2 (two) times daily. 1 Inhaler 6  . montelukast (SINGULAIR) 10 MG tablet Take 1 tablet (10 mg total) by mouth at bedtime. 30 tablet 3  . naproxen (NAPROSYN) 500 MG tablet Take 1 tablet (500 mg total) by mouth 2 (two) times daily with a meal. X 5 days then prn pain 60 tablet 0  . nitrofurantoin, macrocrystal-monohydrate, (MACROBID) 100 MG capsule Take 1 capsule (100 mg total) by mouth 2 (two) times daily. 10 capsule 0   No facility-administered medications prior to visit.       Review of Systems  Constitutional: Positive for malaise/fatigue.  HENT: Positive for hoarse voice, postnasal drip, rhinorrhea, sneezing and sore throat.   Respiratory: Positive for cough and wheezing. Negative for hemoptysis, sputum production and shortness of breath.   Cardiovascular: Positive for dyspnea on exertion.  Gastrointestinal: Positive for heartburn.  Musculoskeletal: Positive for myalgias.  Neurological: Negative for headaches.       Objective:   Physical Exam Vitals:   11/11/18 1048  BP:  110/74  Pulse: 92  Resp: 12  Temp: 98 F (36.7 C)  TempSrc: Oral  SpO2: 99%  Weight: 184 lb (83.5 kg)    Gen: Pleasant, well-nourished, in no distress,  normal affect  ENT: No lesions,  mouth clear,  oropharynx clear, no postnasal drip  Neck: No JVD, no TMG, no carotid bruits  Lungs: No use of accessory muscles, no dullness to percussion, few expiratory wheezes  Cardiovascular: RRR, heart sounds normal, no murmur or gallops, no peripheral edema  Abdomen: soft and NT, no HSM,  BS normal  Musculoskeletal: No deformities,  no cyanosis or clubbing  Neuro: alert, non focal  Skin: Warm, no lesions or rashes  No results found.    CXR 07/2018   IMPRESSION: Hyperinflation with central bronchial thickening suggesting bronchitis or asthma.  Assessment & Plan:  I personally reviewed all images and lab data in the [facility] system as well as any outside material available during this office visit and agree with the  radiology impressions.   Moderate persistent asthma, uncomplicated Moderate persistent asthma currently stable at this time Plan Maintain current inhaled medications Use albuterol as needed No systemic steroids indicated at this time   Chronic sinusitis Chronic sinusitis but no indication for antibiotic therapy Continue Flonase daily  GERD (gastroesophageal reflux disease) Reflux disease playing a role Plan Continue proton pump inhibitor Reflux diet was prescribed   Katrina Mitchell was seen today for hospitalization follow-up.  Diagnoses and all orders for this visit:  Moderate persistent asthma, uncomplicated -     mometasone-formoterol (DULERA) 200-5 MCG/ACT AERO; Inhale 2 puffs into the lungs 2 (two) times daily.  Other chronic sinusitis -     fluticasone (FLONASE) 50 MCG/ACT nasal spray; Place 2 sprays into both nostrils daily.  Gastroesophageal reflux disease without esophagitis  Other orders -     fexofenadine (ALLEGRA ALLERGY) 180 MG tablet; Take 1 tablet  (180 mg total) by mouth daily. -     Discontinue: Spacer/Aero-Holding Chambers (AEROCHAMBER MV) inhaler; Use as instructed -     Discontinue: Spacer/Aero-Holding Chambers (AEROCHAMBER MV) inhaler; Use as instructed -     Spacer/Aero-Holding Chambers (AEROCHAMBER MV) inhaler; Use as instructed

## 2018-11-11 NOTE — Assessment & Plan Note (Addendum)
Moderate persistent asthma currently stable at this time Plan Maintain current inhaled medications Use albuterol as needed No systemic steroids indicated at this time Obtain a spacer device for inhalers

## 2018-11-11 NOTE — Telephone Encounter (Signed)
At today's visit patient was told she has to follow a acid reflux diet and she wanted to know if she could get more information on that and/or if it's something she would have to research on her own. Please follow up with patient.

## 2018-11-11 NOTE — Assessment & Plan Note (Signed)
Chronic sinusitis but no indication for antibiotic therapy Continue Flonase daily

## 2018-11-11 NOTE — Patient Instructions (Addendum)
Continued Dulera 2 puffs twice daily Continue your Nexium daily Follow a reflux diet Use Allegra 180 mg over-the-counter once daily Change Flonase to 2 sprays each nostril daily Discontinue Singulair Use albuterol as needed A spacer device was prescribed for your inhalers Refills on your Elwin SleightDulera was sent to the pharmacy along with Flonase Return 2 months

## 2018-11-11 NOTE — Progress Notes (Signed)
F/u asthma-  Wheezing Dry cough

## 2018-11-11 NOTE — Assessment & Plan Note (Signed)
Reflux disease playing a role Plan Continue proton pump inhibitor Reflux diet was prescribed

## 2018-11-12 ENCOUNTER — Ambulatory Visit: Payer: Medicaid Other | Admitting: Family Medicine

## 2018-11-13 ENCOUNTER — Telehealth: Payer: Self-pay | Admitting: *Deleted

## 2018-11-13 NOTE — Telephone Encounter (Signed)
Patient verified DOB Patient is aware of no STI's being noted and only BV being present. Patient picked up script. No further questions at this time.

## 2018-11-13 NOTE — Telephone Encounter (Signed)
-----   Message from Anders SimmondsAngela M McClung, [place]PA-C sent at 11/04/2018 11:29 AM EST ----- These are the negative Chlamydia, gonorrhea, and trichomonas(STD results). Thanks, Georgian CoAngela McClung, PA-C

## 2018-12-07 MED FILL — $Dulera 200mcg/5 mcg Inh: 200-5 | 75 days supply | Qty: 39 | Fill #0

## 2019-01-12 ENCOUNTER — Ambulatory Visit: Payer: Self-pay | Attending: Family Medicine | Admitting: Family Medicine

## 2019-01-12 ENCOUNTER — Encounter: Payer: Self-pay | Admitting: Family Medicine

## 2019-01-12 VITALS — BP 117/75 | HR 79 | Temp 98.0°F | Ht 67.0 in | Wt 185.6 lb

## 2019-01-12 DIAGNOSIS — L309 Dermatitis, unspecified: Secondary | ICD-10-CM | POA: Insufficient documentation

## 2019-01-12 DIAGNOSIS — L308 Other specified dermatitis: Secondary | ICD-10-CM

## 2019-01-12 DIAGNOSIS — Z79899 Other long term (current) drug therapy: Secondary | ICD-10-CM | POA: Insufficient documentation

## 2019-01-12 DIAGNOSIS — J4540 Moderate persistent asthma, uncomplicated: Secondary | ICD-10-CM

## 2019-01-12 DIAGNOSIS — E7800 Pure hypercholesterolemia, unspecified: Secondary | ICD-10-CM

## 2019-01-12 DIAGNOSIS — K219 Gastro-esophageal reflux disease without esophagitis: Secondary | ICD-10-CM | POA: Insufficient documentation

## 2019-01-12 MED ORDER — MOMETASONE FURO-FORMOTEROL FUM 200-5 MCG/ACT IN AERO: 2.0000 | INHALATION_SPRAY | Freq: Two times a day (BID) | RESPIRATORY_TRACT | 6 refills | Status: AC

## 2019-01-12 MED ORDER — ALBUTEROL SULFATE HFA 108 (90 BASE) MCG/ACT IN AERS: 2.0000 | INHALATION_SPRAY | Freq: Four times a day (QID) | RESPIRATORY_TRACT | 6 refills | Status: AC | PRN

## 2019-01-12 MED ORDER — FLUOCINONIDE-E 0.05 % EX CREA: 1.0000 "application " | TOPICAL_CREAM | Freq: Two times a day (BID) | CUTANEOUS | 1 refills | Status: AC

## 2019-01-12 MED ORDER — ALBUTEROL SULFATE (2.5 MG/3ML) 0.083% IN NEBU: INHALATION_SOLUTION | RESPIRATORY_TRACT | 1 refills | Status: AC

## 2019-01-12 NOTE — Progress Notes (Signed)
Subjective:  Patient ID: Katrina Mitchell, female    DOB: 28-Dec-1980  Age: 38 y.o. MRN: [id number]  CC: Asthma   HPI Katrina Mitchell is a 38 year old female with a history of GERD, asthma, chronic sinusitis who presents today for follow-up visit. Her asthma has been stable and she is having to use her MDI just once a week as needed and uses this in addition to her [facility].  Reflux and sinus symptoms have been controlled. 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  She has gained 12 pounds over the last several months and is unhappy with this weight gain.  Previously was on the keto diet and lost a significant amount of weight but started to lose her hair and so she discontinued this in 03/2018 with resulting improvement in her hair. She is currently not on any particular diet and has been eating without discretion.  She plans to enroll in a gym today.  Past Medical History:  Diagnosis Date  . Asthma   . Complication of anesthesia   . GERD (gastroesophageal reflux disease)   . Pregnancy as incidental finding     Past Surgical History:  Procedure Laterality Date  . CHOLECYSTECTOMY  2001  . LAPAROSCOPIC TUBAL LIGATION  08/19/2012   Procedure: LAPAROSCOPIC TUBAL LIGATION;  Surgeon: Florian Buff, MD;  Location: [facility];  Service: Gynecology;  Laterality: Bilateral;  . lypoma removal  2003   right arm   . NASAL SINUS SURGERY      No Known Allergies   Outpatient Medications Prior to Visit  Medication Sig Dispense Refill  . acetaminophen (TYLENOL) 325 MG tablet Uses as needed for pain    . esomeprazole (NEXIUM) 20 MG capsule Take 20 mg by mouth daily at 12 noon.    . fexofenadine (ALLEGRA ALLERGY) 180 MG tablet Take 1 tablet (180 mg total) by mouth daily.    . fluticasone (FLONASE) 50 MCG/ACT nasal spray Place 2 sprays into both nostrils daily. 16 g 3    . ibuprofen (ADVIL,MOTRIN) 200 MG tablet Per bottle    . Spacer/Aero-Holding Chambers (AEROCHAMBER MV) inhaler Use as instructed 1 each 0  . albuterol (PROVENTIL HFA;VENTOLIN HFA) 108 (90 Base) MCG/ACT inhaler Inhale 2 puffs into the lungs every 6 (six) hours as needed for wheezing or shortness of breath. 1 Inhaler 6  . albuterol (PROVENTIL) (2.5 MG/3ML) 0.083% nebulizer solution TAKE 3 MLS BY NEBULIZATION EVERY 6 HOURS AS NEEDED FOR WHEEZING. 75 mL 1  . fluocinonide-emollient (LIDEX-E) 0.05 % cream Apply 1 application topically 2 (two) times daily. 60 g 1  . mometasone-formoterol (DULERA) 200-5 MCG/ACT AERO Inhale 2 puffs into the lungs 2 (two) times daily. 1 Inhaler 6  . methocarbamol (ROBAXIN) 500 MG tablet 1-2 tid X 5 days then prn muscle spasm (Patient not taking: Reported on 01/12/2019) 90 tablet 0  . metroNIDAZOLE (FLAGYL) 500 MG tablet Take 1 tablet (500 mg total) by mouth 2 (two) times daily. (Patient not taking: Reported on 01/12/2019) 14 tablet 0  . naproxen (NAPROSYN) 500 MG tablet Take 1 tablet (500 mg total) by mouth 2 (two) times daily with a meal. X 5 days then prn pain (Patient not taking: Reported on 01/12/2019) 60 tablet 0   No facility-administered medications prior to visit.     ROS Review of Systems  Constitutional: Negative  for activity change, appetite change and fatigue.  HENT: Negative for congestion, sinus pressure and sore throat.   Eyes: Negative for visual disturbance.  Respiratory: Negative for cough, chest tightness, shortness of breath and wheezing.   Cardiovascular: Negative for chest pain and palpitations.  Gastrointestinal: Negative for abdominal distention, abdominal pain and constipation.  Endocrine: Negative for polydipsia.  Genitourinary: Negative for dysuria and frequency.  Musculoskeletal: Negative for arthralgias and back pain.  Skin: Negative for rash.  Neurological: Negative for tremors, light-headedness and numbness.  Hematological: Does not  bruise/bleed easily.  Psychiatric/Behavioral: Negative for agitation and behavioral problems.    Objective:  BP 117/75   Pulse 79   Temp 98 F (36.7 C) (Oral)   Ht 5' 7"  (1.702 m)   Wt 185 lb 9.6 oz (84.2 kg)   SpO2 100%   BMI 29.07 kg/m   BP/Weight 01/12/2019 11/11/2018 82/80/0349  Systolic BP 179 150 569  Diastolic BP 75 74 82  Wt. (Lbs) 185.6 184 181.8  BMI 29.07 28.82 28.47      Physical Exam Constitutional:      Appearance: She is well-developed.  Cardiovascular:     Rate and Rhythm: Normal rate.     Heart sounds: Normal heart sounds. No murmur.  Pulmonary:     Effort: Pulmonary effort is normal.     Breath sounds: Normal breath sounds. No wheezing or rales.  Chest:     Chest wall: No tenderness.  Abdominal:     General: Bowel sounds are normal. There is no distension.     Palpations: Abdomen is soft. There is no mass.     Tenderness: There is no abdominal tenderness.  Musculoskeletal: Normal range of motion.  Neurological:     Mental Status: She is alert and oriented to person, place, and time.  Psychiatric:        Mood and Affect: Mood normal.        Behavior: Behavior normal.     CMP Latest Ref Rng & Units 07/27/2018 02/19/2018 02/21/2017  Glucose 70 - 99 mg/dL 86 89 83  BUN 6 - 20 mg/dL 10 11 10   Creatinine 0.44 - 1.00 mg/dL 0.68 0.62 0.64  Sodium 135 - 145 mmol/L 136 142 139  Potassium 3.5 - 5.1 mmol/L 3.2(L) 4.3 4.6  Chloride 98 - 111 mmol/L 105 105 103  CO2 22 - 32 mmol/L 26 23 29   Calcium 8.9 - 10.3 mg/dL 8.7(L) 9.2 9.1  Total Protein 6.0 - 8.5 g/dL - 6.6 6.9  Total Bilirubin 0.0 - 1.2 mg/dL - <0.2 0.3  Alkaline Phos 39 - 117 IU/L - 49 53  AST 0 - 40 IU/L - 15 14  ALT 0 - 32 IU/L - 16 14    Lipid Panel     Component Value Date/Time   CHOL 248 (H) 02/19/2018 0921   TRIG 102 02/19/2018 0921   HDL 54 02/19/2018 0921   CHOLHDL 4.6 (H) 02/19/2018 0921   CHOLHDL 5.1 (H) 02/21/2017 0940   VLDL 40 (H) 02/21/2017 0940   LDLCALC 174 (H) 02/19/2018  0921    Assessment & Plan:   1. Moderate persistent asthma, uncomplicated Controlled - albuterol (PROVENTIL HFA;VENTOLIN HFA) 108 (90 Base) MCG/ACT inhaler; Inhale 2 puffs into the lungs every 6 (six) hours as needed for wheezing or shortness of breath.  Dispense: 1 Inhaler; Refill: 6 - albuterol (PROVENTIL) (2.5 MG/3ML) 0.083% nebulizer solution; TAKE 3 MLS BY NEBULIZATION EVERY 6 HOURS AS NEEDED FOR WHEEZING.  Dispense: 75 mL; Refill:  1 - mometasone-formoterol (DULERA) 200-5 MCG/ACT AERO; Inhale 2 puffs into the lungs 2 (two) times daily.  Dispense: 1 Inhaler; Refill: 6  2. 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  Dispense: 60 g; Refill: 1  3. Hypercholesterolemia Uncontrolled Previous elevation likely due to the fact that she has been on the keto diet Keto diet discontinued 8 months ago We will recheck lipid panel again - CMP14+EGFR; Future - Lipid panel; Future   Discussed pros and cons of different diets and she is not invested in returning to the keto diet but we have discussed the [place] and she plans to look into this as well as work on an exercise regimen.  Meds ordered this encounter  Medications  . albuterol (PROVENTIL HFA;VENTOLIN HFA) 108 (90 Base) MCG/ACT inhaler    Sig: Inhale 2 puffs into the lungs every 6 (six) hours as needed for wheezing or shortness of breath.    Dispense:  1 Inhaler    Refill:  6  . albuterol (PROVENTIL) (2.5 MG/3ML) 0.083% nebulizer solution    Sig: TAKE 3 MLS BY NEBULIZATION EVERY 6 HOURS AS NEEDED FOR WHEEZING.    Dispense:  75 mL    Refill:  1  . fluocinonide-emollient (LIDEX-E) 0.05 % cream    Sig: Apply 1 application topically 2 (two) times daily.    Dispense:  60 g    Refill:  1  . mometasone-formoterol (DULERA) 200-5 MCG/ACT AERO    Sig: Inhale 2 puffs into the lungs 2  (two) times daily.    Dispense:  1 Inhaler    Refill:  6    Follow-up: Return in about 6 months (around 07/13/2019) for Follow-up of chronic medical conditions.   Charlott Rakes MD

## 2019-01-12 NOTE — Patient Instructions (Signed)
Mediterranean Diet  A Mediterranean diet refers to food and lifestyle choices that are based on the traditions of countries located on the [place]. This way of eating has been shown to help prevent certain conditions and improve outcomes for people who have chronic diseases, like kidney disease and heart disease.  What are tips for following this plan?  Lifestyle   Cook and eat meals together with your family, when possible.   Drink enough fluid to keep your urine clear or pale yellow.   Be physically active every day. This includes:  ? Aerobic exercise like running or swimming.  ? Leisure activities like gardening, walking, or housework.   Get 7-8 hours of sleep each night.   If recommended by your health care provider, drink red wine in moderation. This means 1 glass a day for nonpregnant women and 2 glasses a day for men. A glass of wine equals 5 oz (150 mL).  Reading food labels     Check the serving size of packaged foods. For foods such as rice and pasta, the serving size refers to the amount of cooked product, not dry.   Check the total fat in packaged foods. Avoid foods that have saturated fat or trans fats.   Check the ingredients list for added sugars, such as corn syrup.  Shopping   At the grocery store, buy most of your food from the areas near the walls of the store. This includes:  ? Fresh fruits and vegetables (produce).  ? Grains, beans, nuts, and seeds. Some of these may be available in unpackaged forms or large amounts (in bulk).  ? Fresh seafood.  ? Poultry and eggs.  ? Low-fat dairy products.   Buy whole ingredients instead of prepackaged foods.   Buy fresh fruits and vegetables in-season from local farmers markets.   Buy frozen fruits and vegetables in resealable bags.   If you do not have access to quality fresh seafood, buy precooked frozen shrimp or canned fish, such as tuna, salmon, or sardines.   Buy small amounts of raw or cooked vegetables, salads, or olives from  the deli or salad bar at your store.   Stock your pantry so you always have certain foods on hand, such as olive oil, canned tuna, canned tomatoes, rice, pasta, and beans.  Cooking   Cook foods with extra-virgin olive oil instead of using butter or other vegetable oils.   Have meat as a side dish, and have vegetables or grains as your main dish. This means having meat in small portions or adding small amounts of meat to foods like pasta or stew.   Use beans or vegetables instead of meat in common dishes like chili or lasagna.   Experiment with different cooking methods. Try roasting or broiling vegetables instead of steaming or sauteing them.   Add frozen vegetables to soups, stews, pasta, or rice.   Add nuts or seeds for added healthy fat at each meal. You can add these to yogurt, salads, or vegetable dishes.   Marinate fish or vegetables using olive oil, lemon juice, garlic, and fresh herbs.  Meal planning     Plan to eat 1 vegetarian meal one day each week. Try to work up to 2 vegetarian meals, if possible.   Eat seafood 2 or more times a week.   Have healthy snacks readily available, such as:  ? Vegetable sticks with hummus.  ? Greek yogurt.  ? Fruit and nut trail mix.   Eat balanced   meals throughout the week. This includes:  ? Fruit: 2-3 servings a day  ? Vegetables: 4-5 servings a day  ? Low-fat dairy: 2 servings a day  ? Fish, poultry, or lean meat: 1 serving a day  ? Beans and legumes: 2 or more servings a week  ? Nuts and seeds: 1-2 servings a day  ? Whole grains: 6-8 servings a day  ? Extra-virgin olive oil: 3-4 servings a day   Limit red meat and sweets to only a few servings a month  What are my food choices?   Mediterranean diet  ? Recommended  ? Grains: Whole-grain pasta. Brown rice. Bulgar wheat. Polenta. Couscous. Whole-wheat bread. Oatmeal. Quinoa.  ? Vegetables: Artichokes. Beets. Broccoli. Cabbage. Carrots. Eggplant. Green beans. Chard. Kale. Spinach. Onions. Leeks. Peas. Squash.  Tomatoes. Peppers. Radishes.  ? Fruits: Apples. Apricots. Avocado. Berries. Bananas. Cherries. Dates. Figs. Grapes. Lemons. Melon. Oranges. Peaches. Plums. Pomegranate.  ? Meats and other protein foods: Beans. Almonds. Sunflower seeds. Pine nuts. Peanuts. Cod. Salmon. Scallops. Shrimp. Tuna. Tilapia. Clams. Oysters. Eggs.  ? Dairy: Low-fat milk. Cheese. Greek yogurt.  ? Beverages: Water. Red wine. Herbal tea.  ? Fats and oils: Extra virgin olive oil. Avocado oil. Grape seed oil.  ? Sweets and desserts: Greek yogurt with honey. Baked apples. Poached pears. Trail mix.  ? Seasoning and other foods: Basil. Cilantro. Coriander. Cumin. Mint. Parsley. Sage. Rosemary. Tarragon. Garlic. Oregano. Thyme. Pepper. Balsalmic vinegar. Tahini. Hummus. Tomato sauce. Olives. Mushrooms.  ? Limit these  ? Grains: Prepackaged pasta or rice dishes. Prepackaged cereal with added sugar.  ? Vegetables: Deep fried potatoes (french fries).  ? Fruits: Fruit canned in syrup.  ? Meats and other protein foods: Beef. Pork. Lamb. Poultry with skin. Hot dogs. Bacon.  ? Dairy: Ice cream. Sour cream. Whole milk.  ? Beverages: Juice. Sugar-sweetened soft drinks. Beer. Liquor and spirits.  ? Fats and oils: Butter. Canola oil. Vegetable oil. Beef fat (tallow). Lard.  ? Sweets and desserts: Cookies. Cakes. Pies. Candy.  ? Seasoning and other foods: Mayonnaise. Premade sauces and marinades.  ? The items listed may not be a complete list. Talk with your dietitian about what dietary choices are right for you.  Summary   The Mediterranean diet includes both food and lifestyle choices.   Eat a variety of fresh fruits and vegetables, beans, nuts, seeds, and whole grains.   Limit the amount of red meat and sweets that you eat.   Talk with your health care provider about whether it is safe for you to drink red wine in moderation. This means 1 glass a day for nonpregnant women and 2 glasses a day for men. A glass of wine equals 5 oz (150 mL).  This information  is not intended to replace advice given to you by your health care provider. Make sure you discuss any questions you have with your health care provider.  Document Released: 07/18/2016 Document Revised: 08/20/2016 Document Reviewed: 07/18/2016  [employer] Interactive Patient Education  2019 [employer]

## 2019-01-13 ENCOUNTER — Ambulatory Visit: Payer: Self-pay | Attending: Family Medicine

## 2019-01-13 DIAGNOSIS — E7800 Pure hypercholesterolemia, unspecified: Secondary | ICD-10-CM

## 2019-01-14 LAB — LIPID PANEL
CHOL/HDL RATIO: 3.6 ratio (ref 0.0–4.4)
Cholesterol, Total: 213 mg/dL — ABNORMAL HIGH (ref 100–199)
HDL: 59 mg/dL (ref >39)
LDL Calculated: 137 mg/dL — ABNORMAL HIGH (ref 0–99)
Triglycerides: 87 mg/dL (ref 0–149)
VLDL Cholesterol Cal: 17 mg/dL (ref 5–40)

## 2019-01-14 LAB — CMP14+EGFR
ALBUMIN: 4.1 g/dL (ref 3.8–4.8)
ALK PHOS: 50 IU/L (ref 39–117)
ALT: 15 IU/L (ref 0–32)
AST: 24 IU/L (ref 0–40)
Albumin/Globulin Ratio: 1.7 (ref 1.2–2.2)
BILIRUBIN TOTAL: 0.5 mg/dL (ref 0.0–1.2)
BUN / CREAT RATIO: 8 — AB (ref 9–23)
BUN: 6 mg/dL (ref 6–20)
CHLORIDE: 105 mmol/L (ref 96–106)
CO2: 21 mmol/L (ref 20–29)
CREATININE: 0.72 mg/dL (ref 0.57–1.00)
Calcium: 9.2 mg/dL (ref 8.7–10.2)
GFR calc non Af Amer: 107 mL/min/{1.73_m2} (ref >59)
GFR, EST AFRICAN AMERICAN: 124 mL/min/{1.73_m2} (ref >59)
GLUCOSE: 85 mg/dL (ref 65–99)
Globulin, Total: 2.4 g/dL (ref 1.5–4.5)
Potassium: 4.3 mmol/L (ref 3.5–5.2)
Sodium: 142 mmol/L (ref 134–144)
TOTAL PROTEIN: 6.5 g/dL (ref 6.0–8.5)

## 2019-03-17 MED FILL — $Dulera 200mcg/5 mcg Inh: 200-5 | 25 days supply | Qty: 13 | Fill #1

## 2019-04-22 MED FILL — !DULERA 200 MCG/5 MCG INH: 200-5 | 25 days supply | Qty: 13 | Fill #2

## 2022-02-12 ENCOUNTER — Other Ambulatory Visit: Payer: Self-pay | Admitting: Student

## 2022-02-12 DIAGNOSIS — Z1231 Encounter for screening mammogram for malignant neoplasm of breast: Secondary | ICD-10-CM

## 2022-03-13 ENCOUNTER — Ambulatory Visit
Admission: RE | Admit: 2022-03-13 | Discharge: 2022-03-13 | Disposition: A | Discharge status: Home / Self Care | Payer: Managed Care, Other (non HMO) | Source: Ambulatory Visit | Attending: Student | Admitting: Student

## 2022-03-13 DIAGNOSIS — Z1231 Encounter for screening mammogram for malignant neoplasm of breast: Secondary | ICD-10-CM | POA: Diagnosis present

## 2022-03-19 ENCOUNTER — Other Ambulatory Visit: Payer: Self-pay | Admitting: Student

## 2022-03-19 DIAGNOSIS — N63 Unspecified lump in unspecified breast: Secondary | ICD-10-CM

## 2022-03-19 DIAGNOSIS — R928 Other abnormal and inconclusive findings on diagnostic imaging of breast: Secondary | ICD-10-CM

## 2022-04-11 ENCOUNTER — Ambulatory Visit
Admission: RE | Admit: 2022-04-11 | Discharge: 2022-04-11 | Disposition: A | Discharge status: Home / Self Care | Payer: Managed Care, Other (non HMO) | Source: Ambulatory Visit | Attending: Student | Admitting: Student

## 2022-04-11 DIAGNOSIS — N63 Unspecified lump in unspecified breast: Secondary | ICD-10-CM | POA: Insufficient documentation

## 2022-04-11 DIAGNOSIS — R928 Other abnormal and inconclusive findings on diagnostic imaging of breast: Secondary | ICD-10-CM
